# Patient Record
Sex: Male | Born: 1940
Health system: Southern US, Community
[De-identification: ages and names within clinical notes are randomized; demographics above are authoritative.]

## PROBLEM LIST (undated history)

## (undated) DIAGNOSIS — B20 Human immunodeficiency virus [HIV] disease: Secondary | ICD-10-CM

## (undated) DIAGNOSIS — E785 Hyperlipidemia, unspecified: Secondary | ICD-10-CM

## (undated) DIAGNOSIS — F209 Schizophrenia, unspecified: Secondary | ICD-10-CM

## (undated) DIAGNOSIS — F329 Major depressive disorder, single episode, unspecified: Secondary | ICD-10-CM

## (undated) DIAGNOSIS — F419 Anxiety disorder, unspecified: Secondary | ICD-10-CM

## (undated) DIAGNOSIS — F32A Depression, unspecified: Secondary | ICD-10-CM

## (undated) DIAGNOSIS — C801 Malignant (primary) neoplasm, unspecified: Secondary | ICD-10-CM

## (undated) DIAGNOSIS — Z21 Asymptomatic human immunodeficiency virus [HIV] infection status: Secondary | ICD-10-CM

## (undated) DIAGNOSIS — E119 Type 2 diabetes mellitus without complications: Secondary | ICD-10-CM

## (undated) DIAGNOSIS — I10 Essential (primary) hypertension: Secondary | ICD-10-CM

## (undated) DIAGNOSIS — M1990 Unspecified osteoarthritis, unspecified site: Secondary | ICD-10-CM

## (undated) DIAGNOSIS — H269 Unspecified cataract: Secondary | ICD-10-CM

## (undated) DIAGNOSIS — J189 Pneumonia, unspecified organism: Secondary | ICD-10-CM

## (undated) DIAGNOSIS — F1910 Other psychoactive substance abuse, uncomplicated: Secondary | ICD-10-CM

## (undated) DIAGNOSIS — N189 Chronic kidney disease, unspecified: Secondary | ICD-10-CM

## (undated) DIAGNOSIS — D649 Anemia, unspecified: Secondary | ICD-10-CM

## (undated) HISTORY — PX: JOINT REPLACEMENT: SHX530

## (undated) HISTORY — DX: Human immunodeficiency virus (HIV) disease: B20

## (undated) HISTORY — DX: Asymptomatic human immunodeficiency virus (hiv) infection status: Z21

## (undated) HISTORY — DX: Hyperlipidemia, unspecified: E78.5

## (undated) HISTORY — DX: Malignant (primary) neoplasm, unspecified: C80.1

## (undated) HISTORY — DX: Depression, unspecified: F32.A

## (undated) HISTORY — DX: Other psychoactive substance abuse, uncomplicated: F19.10

## (undated) HISTORY — PX: EYE SURGERY: SHX253

## (undated) HISTORY — PX: FRACTURE SURGERY: SHX138

## (undated) HISTORY — DX: Major depressive disorder, single episode, unspecified: F32.9

## (undated) HISTORY — DX: Type 2 diabetes mellitus without complications: E11.9

## (undated) HISTORY — DX: Unspecified osteoarthritis, unspecified site: M19.90

## (undated) HISTORY — DX: Unspecified cataract: H26.9

## (undated) HISTORY — DX: Anxiety disorder, unspecified: F41.9

## (undated) HISTORY — DX: Essential (primary) hypertension: I10

## (undated) HISTORY — DX: Chronic kidney disease, unspecified: N18.9

## (undated) HISTORY — PX: COLONOSCOPY: SHX174

---
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
## 2010-07-09 ENCOUNTER — Ambulatory Visit: Payer: Self-pay | Admitting: Infectious Disease

## 2010-12-03 DIAGNOSIS — B20 Human immunodeficiency virus [HIV] disease: Secondary | ICD-10-CM

## 2011-04-08 DIAGNOSIS — B20 Human immunodeficiency virus [HIV] disease: Secondary | ICD-10-CM

## 2011-07-21 ENCOUNTER — Other Ambulatory Visit: Payer: Self-pay | Admitting: Infectious Disease

## 2011-07-23 ENCOUNTER — Other Ambulatory Visit: Payer: Self-pay | Admitting: Infectious Disease

## 2011-08-08 ENCOUNTER — Other Ambulatory Visit: Payer: Self-pay | Admitting: Infectious Disease

## 2011-08-26 DIAGNOSIS — B20 Human immunodeficiency virus [HIV] disease: Secondary | ICD-10-CM

## 2011-09-09 ENCOUNTER — Other Ambulatory Visit: Payer: Self-pay | Admitting: Infectious Disease

## 2011-10-20 ENCOUNTER — Other Ambulatory Visit: Payer: Self-pay | Admitting: Infectious Disease

## 2011-10-20 ENCOUNTER — Telehealth: Payer: Self-pay | Admitting: *Deleted

## 2011-10-20 NOTE — Telephone Encounter (Signed)
Med escribed in error.  [place] patient, pharmacy notified to disregard. Myrtis Hopping CMA

## 2011-10-24 ENCOUNTER — Other Ambulatory Visit: Payer: Self-pay | Admitting: Infectious Disease

## 2011-12-21 DIAGNOSIS — B20 Human immunodeficiency virus [HIV] disease: Secondary | ICD-10-CM

## 2012-04-12 ENCOUNTER — Encounter: Payer: Self-pay | Admitting: Infectious Disease

## 2012-04-18 DIAGNOSIS — B20 Human immunodeficiency virus [HIV] disease: Secondary | ICD-10-CM

## 2012-08-15 DIAGNOSIS — B20 Human immunodeficiency virus [HIV] disease: Secondary | ICD-10-CM

## 2012-10-02 ENCOUNTER — Other Ambulatory Visit: Payer: Self-pay | Admitting: Infectious Disease

## 2012-12-18 ENCOUNTER — Other Ambulatory Visit: Payer: Self-pay | Admitting: Infectious Disease

## 2013-02-06 DIAGNOSIS — B20 Human immunodeficiency virus [HIV] disease: Secondary | ICD-10-CM

## 2013-03-21 ENCOUNTER — Other Ambulatory Visit: Payer: Self-pay | Admitting: *Deleted

## 2013-03-21 ENCOUNTER — Other Ambulatory Visit: Payer: Self-pay | Admitting: Infectious Disease

## 2013-03-21 NOTE — Telephone Encounter (Signed)
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  Called [facility] in [place].  Gave them the corrected information for further/future refills.

## 2013-03-22 DIAGNOSIS — B20 Human immunodeficiency virus [HIV] disease: Secondary | ICD-10-CM

## 2013-04-09 ENCOUNTER — Other Ambulatory Visit: Payer: Self-pay | Admitting: Infectious Disease

## 2013-09-13 DIAGNOSIS — B20 Human immunodeficiency virus [HIV] disease: Secondary | ICD-10-CM | POA: Diagnosis not present

## 2014-03-08 DIAGNOSIS — B20 Human immunodeficiency virus [HIV] disease: Secondary | ICD-10-CM

## 2014-09-12 DIAGNOSIS — B20 Human immunodeficiency virus [HIV] disease: Secondary | ICD-10-CM

## 2014-09-24 ENCOUNTER — Encounter: Payer: Self-pay | Admitting: Infectious Diseases

## 2014-10-01 DIAGNOSIS — N401 Enlarged prostate with lower urinary tract symptoms: Secondary | ICD-10-CM | POA: Diagnosis not present

## 2014-10-01 DIAGNOSIS — R972 Elevated prostate specific antigen [PSA]: Secondary | ICD-10-CM | POA: Diagnosis not present

## 2014-10-01 DIAGNOSIS — C61 Malignant neoplasm of prostate: Secondary | ICD-10-CM | POA: Diagnosis not present

## 2014-10-08 DIAGNOSIS — I10 Essential (primary) hypertension: Secondary | ICD-10-CM | POA: Diagnosis not present

## 2014-10-08 DIAGNOSIS — F200 Paranoid schizophrenia: Secondary | ICD-10-CM | POA: Diagnosis not present

## 2014-10-08 DIAGNOSIS — F329 Major depressive disorder, single episode, unspecified: Secondary | ICD-10-CM | POA: Diagnosis not present

## 2014-10-08 DIAGNOSIS — K5900 Constipation, unspecified: Secondary | ICD-10-CM | POA: Diagnosis not present

## 2014-10-08 DIAGNOSIS — C61 Malignant neoplasm of prostate: Secondary | ICD-10-CM | POA: Diagnosis not present

## 2014-10-08 DIAGNOSIS — E780 Pure hypercholesterolemia: Secondary | ICD-10-CM | POA: Diagnosis not present

## 2014-10-08 DIAGNOSIS — N400 Enlarged prostate without lower urinary tract symptoms: Secondary | ICD-10-CM | POA: Diagnosis not present

## 2014-10-08 DIAGNOSIS — Z21 Asymptomatic human immunodeficiency virus [HIV] infection status: Secondary | ICD-10-CM | POA: Diagnosis not present

## 2014-10-08 DIAGNOSIS — E109 Type 1 diabetes mellitus without complications: Secondary | ICD-10-CM | POA: Diagnosis not present

## 2014-10-25 DIAGNOSIS — E785 Hyperlipidemia, unspecified: Secondary | ICD-10-CM | POA: Diagnosis not present

## 2014-10-25 DIAGNOSIS — B20 Human immunodeficiency virus [HIV] disease: Secondary | ICD-10-CM | POA: Diagnosis not present

## 2014-10-28 DIAGNOSIS — C61 Malignant neoplasm of prostate: Secondary | ICD-10-CM | POA: Diagnosis not present

## 2014-10-29 DIAGNOSIS — N3281 Overactive bladder: Secondary | ICD-10-CM | POA: Diagnosis not present

## 2014-10-29 DIAGNOSIS — C61 Malignant neoplasm of prostate: Secondary | ICD-10-CM | POA: Diagnosis not present

## 2014-10-30 DIAGNOSIS — Z510 Encounter for antineoplastic radiation therapy: Secondary | ICD-10-CM | POA: Diagnosis not present

## 2014-10-30 DIAGNOSIS — C61 Malignant neoplasm of prostate: Secondary | ICD-10-CM | POA: Diagnosis not present

## 2014-11-05 DIAGNOSIS — C61 Malignant neoplasm of prostate: Secondary | ICD-10-CM | POA: Diagnosis not present

## 2014-11-05 DIAGNOSIS — Z510 Encounter for antineoplastic radiation therapy: Secondary | ICD-10-CM | POA: Diagnosis not present

## 2014-11-05 DIAGNOSIS — Z21 Asymptomatic human immunodeficiency virus [HIV] infection status: Secondary | ICD-10-CM | POA: Diagnosis not present

## 2014-11-05 DIAGNOSIS — F329 Major depressive disorder, single episode, unspecified: Secondary | ICD-10-CM | POA: Diagnosis not present

## 2014-11-05 DIAGNOSIS — E109 Type 1 diabetes mellitus without complications: Secondary | ICD-10-CM | POA: Diagnosis not present

## 2014-11-05 DIAGNOSIS — N400 Enlarged prostate without lower urinary tract symptoms: Secondary | ICD-10-CM | POA: Diagnosis not present

## 2014-11-05 DIAGNOSIS — R945 Abnormal results of liver function studies: Secondary | ICD-10-CM | POA: Diagnosis not present

## 2014-11-05 DIAGNOSIS — K5900 Constipation, unspecified: Secondary | ICD-10-CM | POA: Diagnosis not present

## 2014-11-05 DIAGNOSIS — F200 Paranoid schizophrenia: Secondary | ICD-10-CM | POA: Diagnosis not present

## 2014-11-05 DIAGNOSIS — E780 Pure hypercholesterolemia: Secondary | ICD-10-CM | POA: Diagnosis not present

## 2014-11-06 DIAGNOSIS — Z510 Encounter for antineoplastic radiation therapy: Secondary | ICD-10-CM | POA: Diagnosis not present

## 2014-11-06 DIAGNOSIS — C61 Malignant neoplasm of prostate: Secondary | ICD-10-CM | POA: Diagnosis not present

## 2014-11-13 DIAGNOSIS — C61 Malignant neoplasm of prostate: Secondary | ICD-10-CM | POA: Diagnosis not present

## 2014-11-13 DIAGNOSIS — Z510 Encounter for antineoplastic radiation therapy: Secondary | ICD-10-CM | POA: Diagnosis not present

## 2014-11-14 DIAGNOSIS — C61 Malignant neoplasm of prostate: Secondary | ICD-10-CM | POA: Diagnosis not present

## 2014-11-14 DIAGNOSIS — Z510 Encounter for antineoplastic radiation therapy: Secondary | ICD-10-CM | POA: Diagnosis not present

## 2014-11-15 DIAGNOSIS — C61 Malignant neoplasm of prostate: Secondary | ICD-10-CM | POA: Diagnosis not present

## 2014-11-15 DIAGNOSIS — Z510 Encounter for antineoplastic radiation therapy: Secondary | ICD-10-CM | POA: Diagnosis not present

## 2014-11-18 DIAGNOSIS — Z510 Encounter for antineoplastic radiation therapy: Secondary | ICD-10-CM | POA: Diagnosis not present

## 2014-11-18 DIAGNOSIS — C61 Malignant neoplasm of prostate: Secondary | ICD-10-CM | POA: Diagnosis not present

## 2014-11-19 DIAGNOSIS — C61 Malignant neoplasm of prostate: Secondary | ICD-10-CM | POA: Diagnosis not present

## 2014-11-19 DIAGNOSIS — Z510 Encounter for antineoplastic radiation therapy: Secondary | ICD-10-CM | POA: Diagnosis not present

## 2014-11-20 DIAGNOSIS — Z510 Encounter for antineoplastic radiation therapy: Secondary | ICD-10-CM | POA: Diagnosis not present

## 2014-11-20 DIAGNOSIS — C61 Malignant neoplasm of prostate: Secondary | ICD-10-CM | POA: Diagnosis not present

## 2014-11-21 DIAGNOSIS — C61 Malignant neoplasm of prostate: Secondary | ICD-10-CM | POA: Diagnosis not present

## 2014-11-21 DIAGNOSIS — Z510 Encounter for antineoplastic radiation therapy: Secondary | ICD-10-CM | POA: Diagnosis not present

## 2014-11-22 DIAGNOSIS — Z510 Encounter for antineoplastic radiation therapy: Secondary | ICD-10-CM | POA: Diagnosis not present

## 2014-11-22 DIAGNOSIS — C61 Malignant neoplasm of prostate: Secondary | ICD-10-CM | POA: Diagnosis not present

## 2014-11-25 DIAGNOSIS — Z0100 Encounter for examination of eyes and vision without abnormal findings: Secondary | ICD-10-CM | POA: Diagnosis not present

## 2014-11-25 DIAGNOSIS — Z510 Encounter for antineoplastic radiation therapy: Secondary | ICD-10-CM | POA: Diagnosis not present

## 2014-11-25 DIAGNOSIS — C61 Malignant neoplasm of prostate: Secondary | ICD-10-CM | POA: Diagnosis not present

## 2014-11-26 DIAGNOSIS — C61 Malignant neoplasm of prostate: Secondary | ICD-10-CM | POA: Diagnosis not present

## 2014-11-26 DIAGNOSIS — Z510 Encounter for antineoplastic radiation therapy: Secondary | ICD-10-CM | POA: Diagnosis not present

## 2014-11-27 DIAGNOSIS — C61 Malignant neoplasm of prostate: Secondary | ICD-10-CM | POA: Diagnosis not present

## 2014-11-27 DIAGNOSIS — N3281 Overactive bladder: Secondary | ICD-10-CM | POA: Diagnosis not present

## 2014-11-27 DIAGNOSIS — G4700 Insomnia, unspecified: Secondary | ICD-10-CM | POA: Diagnosis not present

## 2014-11-27 DIAGNOSIS — Z510 Encounter for antineoplastic radiation therapy: Secondary | ICD-10-CM | POA: Diagnosis not present

## 2014-11-28 DIAGNOSIS — C61 Malignant neoplasm of prostate: Secondary | ICD-10-CM | POA: Diagnosis not present

## 2014-11-28 DIAGNOSIS — Z510 Encounter for antineoplastic radiation therapy: Secondary | ICD-10-CM | POA: Diagnosis not present

## 2014-11-29 DIAGNOSIS — Z510 Encounter for antineoplastic radiation therapy: Secondary | ICD-10-CM | POA: Diagnosis not present

## 2014-11-29 DIAGNOSIS — C61 Malignant neoplasm of prostate: Secondary | ICD-10-CM | POA: Diagnosis not present

## 2014-12-02 DIAGNOSIS — Z510 Encounter for antineoplastic radiation therapy: Secondary | ICD-10-CM | POA: Diagnosis not present

## 2014-12-02 DIAGNOSIS — C61 Malignant neoplasm of prostate: Secondary | ICD-10-CM | POA: Diagnosis not present

## 2014-12-03 DIAGNOSIS — C61 Malignant neoplasm of prostate: Secondary | ICD-10-CM | POA: Diagnosis not present

## 2014-12-03 DIAGNOSIS — Z510 Encounter for antineoplastic radiation therapy: Secondary | ICD-10-CM | POA: Diagnosis not present

## 2014-12-04 DIAGNOSIS — C61 Malignant neoplasm of prostate: Secondary | ICD-10-CM | POA: Diagnosis not present

## 2014-12-04 DIAGNOSIS — Z510 Encounter for antineoplastic radiation therapy: Secondary | ICD-10-CM | POA: Diagnosis not present

## 2014-12-05 DIAGNOSIS — C61 Malignant neoplasm of prostate: Secondary | ICD-10-CM | POA: Diagnosis not present

## 2014-12-05 DIAGNOSIS — Z510 Encounter for antineoplastic radiation therapy: Secondary | ICD-10-CM | POA: Diagnosis not present

## 2014-12-06 DIAGNOSIS — C61 Malignant neoplasm of prostate: Secondary | ICD-10-CM | POA: Diagnosis not present

## 2014-12-06 DIAGNOSIS — Z510 Encounter for antineoplastic radiation therapy: Secondary | ICD-10-CM | POA: Diagnosis not present

## 2014-12-08 DIAGNOSIS — R918 Other nonspecific abnormal finding of lung field: Secondary | ICD-10-CM | POA: Diagnosis not present

## 2014-12-08 DIAGNOSIS — R0600 Dyspnea, unspecified: Secondary | ICD-10-CM | POA: Diagnosis not present

## 2014-12-08 DIAGNOSIS — R05 Cough: Secondary | ICD-10-CM | POA: Diagnosis not present

## 2014-12-08 DIAGNOSIS — Z21 Asymptomatic human immunodeficiency virus [HIV] infection status: Secondary | ICD-10-CM | POA: Diagnosis not present

## 2014-12-08 DIAGNOSIS — J189 Pneumonia, unspecified organism: Secondary | ICD-10-CM | POA: Diagnosis not present

## 2014-12-08 DIAGNOSIS — E1165 Type 2 diabetes mellitus with hyperglycemia: Secondary | ICD-10-CM | POA: Diagnosis not present

## 2014-12-08 DIAGNOSIS — R6883 Chills (without fever): Secondary | ICD-10-CM | POA: Diagnosis not present

## 2014-12-08 DIAGNOSIS — D696 Thrombocytopenia, unspecified: Secondary | ICD-10-CM | POA: Diagnosis not present

## 2014-12-08 DIAGNOSIS — J156 Pneumonia due to other aerobic Gram-negative bacteria: Secondary | ICD-10-CM | POA: Diagnosis not present

## 2014-12-08 DIAGNOSIS — A419 Sepsis, unspecified organism: Secondary | ICD-10-CM | POA: Diagnosis not present

## 2014-12-08 DIAGNOSIS — C61 Malignant neoplasm of prostate: Secondary | ICD-10-CM | POA: Diagnosis not present

## 2014-12-08 DIAGNOSIS — N400 Enlarged prostate without lower urinary tract symptoms: Secondary | ICD-10-CM | POA: Diagnosis not present

## 2014-12-08 DIAGNOSIS — Z510 Encounter for antineoplastic radiation therapy: Secondary | ICD-10-CM | POA: Diagnosis not present

## 2014-12-08 DIAGNOSIS — R531 Weakness: Secondary | ICD-10-CM | POA: Diagnosis not present

## 2014-12-08 DIAGNOSIS — I959 Hypotension, unspecified: Secondary | ICD-10-CM | POA: Diagnosis not present

## 2014-12-08 DIAGNOSIS — R6521 Severe sepsis with septic shock: Secondary | ICD-10-CM | POA: Diagnosis not present

## 2014-12-08 DIAGNOSIS — E780 Pure hypercholesterolemia: Secondary | ICD-10-CM | POA: Diagnosis not present

## 2014-12-08 DIAGNOSIS — J984 Other disorders of lung: Secondary | ICD-10-CM | POA: Diagnosis not present

## 2014-12-08 DIAGNOSIS — I10 Essential (primary) hypertension: Secondary | ICD-10-CM | POA: Diagnosis not present

## 2014-12-11 DIAGNOSIS — C61 Malignant neoplasm of prostate: Secondary | ICD-10-CM | POA: Diagnosis not present

## 2014-12-11 DIAGNOSIS — Z510 Encounter for antineoplastic radiation therapy: Secondary | ICD-10-CM | POA: Diagnosis not present

## 2014-12-12 DIAGNOSIS — Z510 Encounter for antineoplastic radiation therapy: Secondary | ICD-10-CM | POA: Diagnosis not present

## 2014-12-12 DIAGNOSIS — C61 Malignant neoplasm of prostate: Secondary | ICD-10-CM | POA: Diagnosis not present

## 2014-12-13 DIAGNOSIS — Z510 Encounter for antineoplastic radiation therapy: Secondary | ICD-10-CM | POA: Diagnosis not present

## 2014-12-13 DIAGNOSIS — C61 Malignant neoplasm of prostate: Secondary | ICD-10-CM | POA: Diagnosis not present

## 2014-12-16 DIAGNOSIS — Z510 Encounter for antineoplastic radiation therapy: Secondary | ICD-10-CM | POA: Diagnosis not present

## 2014-12-16 DIAGNOSIS — C61 Malignant neoplasm of prostate: Secondary | ICD-10-CM | POA: Diagnosis not present

## 2014-12-17 DIAGNOSIS — C61 Malignant neoplasm of prostate: Secondary | ICD-10-CM | POA: Diagnosis not present

## 2014-12-17 DIAGNOSIS — Z510 Encounter for antineoplastic radiation therapy: Secondary | ICD-10-CM | POA: Diagnosis not present

## 2014-12-18 DIAGNOSIS — Z510 Encounter for antineoplastic radiation therapy: Secondary | ICD-10-CM | POA: Diagnosis not present

## 2014-12-18 DIAGNOSIS — C61 Malignant neoplasm of prostate: Secondary | ICD-10-CM | POA: Diagnosis not present

## 2014-12-19 DIAGNOSIS — Z21 Asymptomatic human immunodeficiency virus [HIV] infection status: Secondary | ICD-10-CM | POA: Diagnosis not present

## 2014-12-19 DIAGNOSIS — Z510 Encounter for antineoplastic radiation therapy: Secondary | ICD-10-CM | POA: Diagnosis not present

## 2014-12-19 DIAGNOSIS — E109 Type 1 diabetes mellitus without complications: Secondary | ICD-10-CM | POA: Diagnosis not present

## 2014-12-19 DIAGNOSIS — K5900 Constipation, unspecified: Secondary | ICD-10-CM | POA: Diagnosis not present

## 2014-12-19 DIAGNOSIS — R945 Abnormal results of liver function studies: Secondary | ICD-10-CM | POA: Diagnosis not present

## 2014-12-19 DIAGNOSIS — N400 Enlarged prostate without lower urinary tract symptoms: Secondary | ICD-10-CM | POA: Diagnosis not present

## 2014-12-19 DIAGNOSIS — C61 Malignant neoplasm of prostate: Secondary | ICD-10-CM | POA: Diagnosis not present

## 2014-12-19 DIAGNOSIS — E780 Pure hypercholesterolemia: Secondary | ICD-10-CM | POA: Diagnosis not present

## 2014-12-19 DIAGNOSIS — F200 Paranoid schizophrenia: Secondary | ICD-10-CM | POA: Diagnosis not present

## 2014-12-19 DIAGNOSIS — F329 Major depressive disorder, single episode, unspecified: Secondary | ICD-10-CM | POA: Diagnosis not present

## 2014-12-23 DIAGNOSIS — Z510 Encounter for antineoplastic radiation therapy: Secondary | ICD-10-CM | POA: Diagnosis not present

## 2014-12-23 DIAGNOSIS — C61 Malignant neoplasm of prostate: Secondary | ICD-10-CM | POA: Diagnosis not present

## 2014-12-24 DIAGNOSIS — C61 Malignant neoplasm of prostate: Secondary | ICD-10-CM | POA: Diagnosis not present

## 2014-12-24 DIAGNOSIS — Z510 Encounter for antineoplastic radiation therapy: Secondary | ICD-10-CM | POA: Diagnosis not present

## 2014-12-25 DIAGNOSIS — C61 Malignant neoplasm of prostate: Secondary | ICD-10-CM | POA: Diagnosis not present

## 2014-12-25 DIAGNOSIS — Z510 Encounter for antineoplastic radiation therapy: Secondary | ICD-10-CM | POA: Diagnosis not present

## 2014-12-26 DIAGNOSIS — C61 Malignant neoplasm of prostate: Secondary | ICD-10-CM | POA: Diagnosis not present

## 2014-12-26 DIAGNOSIS — Z510 Encounter for antineoplastic radiation therapy: Secondary | ICD-10-CM | POA: Diagnosis not present

## 2014-12-27 DIAGNOSIS — C61 Malignant neoplasm of prostate: Secondary | ICD-10-CM | POA: Diagnosis not present

## 2014-12-27 DIAGNOSIS — Z510 Encounter for antineoplastic radiation therapy: Secondary | ICD-10-CM | POA: Diagnosis not present

## 2014-12-30 DIAGNOSIS — Z510 Encounter for antineoplastic radiation therapy: Secondary | ICD-10-CM | POA: Diagnosis not present

## 2014-12-30 DIAGNOSIS — C61 Malignant neoplasm of prostate: Secondary | ICD-10-CM | POA: Diagnosis not present

## 2014-12-31 DIAGNOSIS — C61 Malignant neoplasm of prostate: Secondary | ICD-10-CM | POA: Diagnosis not present

## 2014-12-31 DIAGNOSIS — Z510 Encounter for antineoplastic radiation therapy: Secondary | ICD-10-CM | POA: Diagnosis not present

## 2014-12-31 DIAGNOSIS — R945 Abnormal results of liver function studies: Secondary | ICD-10-CM | POA: Diagnosis not present

## 2014-12-31 DIAGNOSIS — E780 Pure hypercholesterolemia: Secondary | ICD-10-CM | POA: Diagnosis not present

## 2014-12-31 DIAGNOSIS — Z21 Asymptomatic human immunodeficiency virus [HIV] infection status: Secondary | ICD-10-CM | POA: Diagnosis not present

## 2014-12-31 DIAGNOSIS — N400 Enlarged prostate without lower urinary tract symptoms: Secondary | ICD-10-CM | POA: Diagnosis not present

## 2014-12-31 DIAGNOSIS — F200 Paranoid schizophrenia: Secondary | ICD-10-CM | POA: Diagnosis not present

## 2014-12-31 DIAGNOSIS — E109 Type 1 diabetes mellitus without complications: Secondary | ICD-10-CM | POA: Diagnosis not present

## 2014-12-31 DIAGNOSIS — K5900 Constipation, unspecified: Secondary | ICD-10-CM | POA: Diagnosis not present

## 2014-12-31 DIAGNOSIS — F329 Major depressive disorder, single episode, unspecified: Secondary | ICD-10-CM | POA: Diagnosis not present

## 2014-12-31 DIAGNOSIS — I10 Essential (primary) hypertension: Secondary | ICD-10-CM | POA: Diagnosis not present

## 2015-01-01 DIAGNOSIS — C61 Malignant neoplasm of prostate: Secondary | ICD-10-CM | POA: Diagnosis not present

## 2015-01-01 DIAGNOSIS — Z510 Encounter for antineoplastic radiation therapy: Secondary | ICD-10-CM | POA: Diagnosis not present

## 2015-01-02 DIAGNOSIS — Z510 Encounter for antineoplastic radiation therapy: Secondary | ICD-10-CM | POA: Diagnosis not present

## 2015-01-02 DIAGNOSIS — C61 Malignant neoplasm of prostate: Secondary | ICD-10-CM | POA: Diagnosis not present

## 2015-01-03 DIAGNOSIS — Z510 Encounter for antineoplastic radiation therapy: Secondary | ICD-10-CM | POA: Diagnosis not present

## 2015-01-03 DIAGNOSIS — C61 Malignant neoplasm of prostate: Secondary | ICD-10-CM | POA: Diagnosis not present

## 2015-01-06 DIAGNOSIS — C61 Malignant neoplasm of prostate: Secondary | ICD-10-CM | POA: Diagnosis not present

## 2015-01-06 DIAGNOSIS — Z510 Encounter for antineoplastic radiation therapy: Secondary | ICD-10-CM | POA: Diagnosis not present

## 2015-01-07 DIAGNOSIS — Z510 Encounter for antineoplastic radiation therapy: Secondary | ICD-10-CM | POA: Diagnosis not present

## 2015-01-07 DIAGNOSIS — C61 Malignant neoplasm of prostate: Secondary | ICD-10-CM | POA: Diagnosis not present

## 2015-01-08 DIAGNOSIS — Z510 Encounter for antineoplastic radiation therapy: Secondary | ICD-10-CM | POA: Diagnosis not present

## 2015-01-08 DIAGNOSIS — C61 Malignant neoplasm of prostate: Secondary | ICD-10-CM | POA: Diagnosis not present

## 2015-01-09 DIAGNOSIS — N3281 Overactive bladder: Secondary | ICD-10-CM | POA: Diagnosis not present

## 2015-01-09 DIAGNOSIS — C61 Malignant neoplasm of prostate: Secondary | ICD-10-CM | POA: Diagnosis not present

## 2015-01-09 DIAGNOSIS — Z510 Encounter for antineoplastic radiation therapy: Secondary | ICD-10-CM | POA: Diagnosis not present

## 2015-01-10 DIAGNOSIS — C61 Malignant neoplasm of prostate: Secondary | ICD-10-CM | POA: Diagnosis not present

## 2015-01-10 DIAGNOSIS — Z510 Encounter for antineoplastic radiation therapy: Secondary | ICD-10-CM | POA: Diagnosis not present

## 2015-01-14 DIAGNOSIS — Z1389 Encounter for screening for other disorder: Secondary | ICD-10-CM | POA: Diagnosis not present

## 2015-01-14 DIAGNOSIS — K219 Gastro-esophageal reflux disease without esophagitis: Secondary | ICD-10-CM | POA: Diagnosis not present

## 2015-01-14 DIAGNOSIS — Z9181 History of falling: Secondary | ICD-10-CM | POA: Diagnosis not present

## 2015-01-14 DIAGNOSIS — E46 Unspecified protein-calorie malnutrition: Secondary | ICD-10-CM | POA: Diagnosis not present

## 2015-01-14 DIAGNOSIS — N400 Enlarged prostate without lower urinary tract symptoms: Secondary | ICD-10-CM | POA: Diagnosis not present

## 2015-01-14 DIAGNOSIS — I714 Abdominal aortic aneurysm, without rupture: Secondary | ICD-10-CM | POA: Diagnosis not present

## 2015-01-14 DIAGNOSIS — M545 Low back pain: Secondary | ICD-10-CM | POA: Diagnosis not present

## 2015-01-14 DIAGNOSIS — F329 Major depressive disorder, single episode, unspecified: Secondary | ICD-10-CM | POA: Diagnosis not present

## 2015-02-05 DIAGNOSIS — Z79899 Other long term (current) drug therapy: Secondary | ICD-10-CM | POA: Diagnosis not present

## 2015-02-05 DIAGNOSIS — G8929 Other chronic pain: Secondary | ICD-10-CM | POA: Diagnosis not present

## 2015-02-05 DIAGNOSIS — E089 Diabetes mellitus due to underlying condition without complications: Secondary | ICD-10-CM | POA: Diagnosis not present

## 2015-02-05 DIAGNOSIS — G893 Neoplasm related pain (acute) (chronic): Secondary | ICD-10-CM | POA: Diagnosis not present

## 2015-02-05 DIAGNOSIS — M179 Osteoarthritis of knee, unspecified: Secondary | ICD-10-CM | POA: Diagnosis not present

## 2015-02-05 DIAGNOSIS — G894 Chronic pain syndrome: Secondary | ICD-10-CM | POA: Diagnosis not present

## 2015-02-05 DIAGNOSIS — I10 Essential (primary) hypertension: Secondary | ICD-10-CM | POA: Diagnosis not present

## 2015-02-05 DIAGNOSIS — M545 Low back pain: Secondary | ICD-10-CM | POA: Diagnosis not present

## 2015-02-06 DIAGNOSIS — Z79899 Other long term (current) drug therapy: Secondary | ICD-10-CM | POA: Diagnosis not present

## 2015-02-06 DIAGNOSIS — B20 Human immunodeficiency virus [HIV] disease: Secondary | ICD-10-CM | POA: Diagnosis not present

## 2015-02-12 DIAGNOSIS — E109 Type 1 diabetes mellitus without complications: Secondary | ICD-10-CM | POA: Diagnosis not present

## 2015-02-12 DIAGNOSIS — K5900 Constipation, unspecified: Secondary | ICD-10-CM | POA: Diagnosis not present

## 2015-02-12 DIAGNOSIS — F329 Major depressive disorder, single episode, unspecified: Secondary | ICD-10-CM | POA: Diagnosis not present

## 2015-02-12 DIAGNOSIS — F200 Paranoid schizophrenia: Secondary | ICD-10-CM | POA: Diagnosis not present

## 2015-02-12 DIAGNOSIS — N400 Enlarged prostate without lower urinary tract symptoms: Secondary | ICD-10-CM | POA: Diagnosis not present

## 2015-02-12 DIAGNOSIS — E780 Pure hypercholesterolemia: Secondary | ICD-10-CM | POA: Diagnosis not present

## 2015-02-12 DIAGNOSIS — Z21 Asymptomatic human immunodeficiency virus [HIV] infection status: Secondary | ICD-10-CM | POA: Diagnosis not present

## 2015-02-12 DIAGNOSIS — Z79899 Other long term (current) drug therapy: Secondary | ICD-10-CM | POA: Diagnosis not present

## 2015-02-20 DIAGNOSIS — C61 Malignant neoplasm of prostate: Secondary | ICD-10-CM | POA: Diagnosis not present

## 2015-02-20 DIAGNOSIS — N3281 Overactive bladder: Secondary | ICD-10-CM | POA: Diagnosis not present

## 2015-02-27 DIAGNOSIS — E119 Type 2 diabetes mellitus without complications: Secondary | ICD-10-CM | POA: Diagnosis not present

## 2015-02-27 DIAGNOSIS — C61 Malignant neoplasm of prostate: Secondary | ICD-10-CM | POA: Diagnosis not present

## 2015-02-27 DIAGNOSIS — G4700 Insomnia, unspecified: Secondary | ICD-10-CM | POA: Diagnosis not present

## 2015-02-27 DIAGNOSIS — B20 Human immunodeficiency virus [HIV] disease: Secondary | ICD-10-CM | POA: Diagnosis not present

## 2015-02-28 DIAGNOSIS — B20 Human immunodeficiency virus [HIV] disease: Secondary | ICD-10-CM | POA: Diagnosis not present

## 2015-03-06 DIAGNOSIS — E780 Pure hypercholesterolemia: Secondary | ICD-10-CM | POA: Diagnosis not present

## 2015-03-06 DIAGNOSIS — F329 Major depressive disorder, single episode, unspecified: Secondary | ICD-10-CM | POA: Diagnosis not present

## 2015-03-06 DIAGNOSIS — Z21 Asymptomatic human immunodeficiency virus [HIV] infection status: Secondary | ICD-10-CM | POA: Diagnosis not present

## 2015-03-06 DIAGNOSIS — E109 Type 1 diabetes mellitus without complications: Secondary | ICD-10-CM | POA: Diagnosis not present

## 2015-03-06 DIAGNOSIS — N400 Enlarged prostate without lower urinary tract symptoms: Secondary | ICD-10-CM | POA: Diagnosis not present

## 2015-03-06 DIAGNOSIS — Z9181 History of falling: Secondary | ICD-10-CM | POA: Diagnosis not present

## 2015-03-06 DIAGNOSIS — K5900 Constipation, unspecified: Secondary | ICD-10-CM | POA: Diagnosis not present

## 2015-03-06 DIAGNOSIS — F200 Paranoid schizophrenia: Secondary | ICD-10-CM | POA: Diagnosis not present

## 2015-04-03 DIAGNOSIS — E109 Type 1 diabetes mellitus without complications: Secondary | ICD-10-CM | POA: Diagnosis not present

## 2015-04-03 DIAGNOSIS — I10 Essential (primary) hypertension: Secondary | ICD-10-CM | POA: Diagnosis not present

## 2015-04-03 DIAGNOSIS — F200 Paranoid schizophrenia: Secondary | ICD-10-CM | POA: Diagnosis not present

## 2015-04-03 DIAGNOSIS — Z21 Asymptomatic human immunodeficiency virus [HIV] infection status: Secondary | ICD-10-CM | POA: Diagnosis not present

## 2015-04-03 DIAGNOSIS — E780 Pure hypercholesterolemia: Secondary | ICD-10-CM | POA: Diagnosis not present

## 2015-04-03 DIAGNOSIS — F329 Major depressive disorder, single episode, unspecified: Secondary | ICD-10-CM | POA: Diagnosis not present

## 2015-04-03 DIAGNOSIS — E669 Obesity, unspecified: Secondary | ICD-10-CM | POA: Diagnosis not present

## 2015-04-03 DIAGNOSIS — K5900 Constipation, unspecified: Secondary | ICD-10-CM | POA: Diagnosis not present

## 2015-04-03 DIAGNOSIS — N400 Enlarged prostate without lower urinary tract symptoms: Secondary | ICD-10-CM | POA: Diagnosis not present

## 2015-05-01 DIAGNOSIS — F200 Paranoid schizophrenia: Secondary | ICD-10-CM | POA: Diagnosis not present

## 2015-05-01 DIAGNOSIS — K5900 Constipation, unspecified: Secondary | ICD-10-CM | POA: Diagnosis not present

## 2015-05-01 DIAGNOSIS — F329 Major depressive disorder, single episode, unspecified: Secondary | ICD-10-CM | POA: Diagnosis not present

## 2015-05-01 DIAGNOSIS — E109 Type 1 diabetes mellitus without complications: Secondary | ICD-10-CM | POA: Diagnosis not present

## 2015-05-01 DIAGNOSIS — Z21 Asymptomatic human immunodeficiency virus [HIV] infection status: Secondary | ICD-10-CM | POA: Diagnosis not present

## 2015-05-01 DIAGNOSIS — R945 Abnormal results of liver function studies: Secondary | ICD-10-CM | POA: Diagnosis not present

## 2015-05-01 DIAGNOSIS — E780 Pure hypercholesterolemia: Secondary | ICD-10-CM | POA: Diagnosis not present

## 2015-05-01 DIAGNOSIS — N400 Enlarged prostate without lower urinary tract symptoms: Secondary | ICD-10-CM | POA: Diagnosis not present

## 2015-05-29 DIAGNOSIS — Z21 Asymptomatic human immunodeficiency virus [HIV] infection status: Secondary | ICD-10-CM | POA: Diagnosis not present

## 2015-05-29 DIAGNOSIS — F200 Paranoid schizophrenia: Secondary | ICD-10-CM | POA: Diagnosis not present

## 2015-05-29 DIAGNOSIS — F329 Major depressive disorder, single episode, unspecified: Secondary | ICD-10-CM | POA: Diagnosis not present

## 2015-05-29 DIAGNOSIS — R945 Abnormal results of liver function studies: Secondary | ICD-10-CM | POA: Diagnosis not present

## 2015-05-29 DIAGNOSIS — K5900 Constipation, unspecified: Secondary | ICD-10-CM | POA: Diagnosis not present

## 2015-05-29 DIAGNOSIS — E780 Pure hypercholesterolemia: Secondary | ICD-10-CM | POA: Diagnosis not present

## 2015-05-29 DIAGNOSIS — N400 Enlarged prostate without lower urinary tract symptoms: Secondary | ICD-10-CM | POA: Diagnosis not present

## 2015-05-29 DIAGNOSIS — E109 Type 1 diabetes mellitus without complications: Secondary | ICD-10-CM | POA: Diagnosis not present

## 2015-06-03 DIAGNOSIS — Z1211 Encounter for screening for malignant neoplasm of colon: Secondary | ICD-10-CM | POA: Diagnosis not present

## 2015-06-03 DIAGNOSIS — R1032 Left lower quadrant pain: Secondary | ICD-10-CM | POA: Diagnosis not present

## 2015-06-03 DIAGNOSIS — Z86010 Personal history of colonic polyps: Secondary | ICD-10-CM | POA: Diagnosis not present

## 2015-06-10 DIAGNOSIS — N3281 Overactive bladder: Secondary | ICD-10-CM | POA: Diagnosis not present

## 2015-06-10 DIAGNOSIS — C61 Malignant neoplasm of prostate: Secondary | ICD-10-CM | POA: Diagnosis not present

## 2015-06-19 DIAGNOSIS — E11329 Type 2 diabetes mellitus with mild nonproliferative diabetic retinopathy without macular edema: Secondary | ICD-10-CM | POA: Diagnosis not present

## 2015-06-26 DIAGNOSIS — K5900 Constipation, unspecified: Secondary | ICD-10-CM | POA: Diagnosis not present

## 2015-06-26 DIAGNOSIS — N400 Enlarged prostate without lower urinary tract symptoms: Secondary | ICD-10-CM | POA: Diagnosis not present

## 2015-06-26 DIAGNOSIS — E109 Type 1 diabetes mellitus without complications: Secondary | ICD-10-CM | POA: Diagnosis not present

## 2015-06-26 DIAGNOSIS — F200 Paranoid schizophrenia: Secondary | ICD-10-CM | POA: Diagnosis not present

## 2015-06-26 DIAGNOSIS — F329 Major depressive disorder, single episode, unspecified: Secondary | ICD-10-CM | POA: Diagnosis not present

## 2015-06-26 DIAGNOSIS — E780 Pure hypercholesterolemia: Secondary | ICD-10-CM | POA: Diagnosis not present

## 2015-06-26 DIAGNOSIS — Z21 Asymptomatic human immunodeficiency virus [HIV] infection status: Secondary | ICD-10-CM | POA: Diagnosis not present

## 2015-06-26 DIAGNOSIS — Z23 Encounter for immunization: Secondary | ICD-10-CM | POA: Diagnosis not present

## 2015-07-10 DIAGNOSIS — J329 Chronic sinusitis, unspecified: Secondary | ICD-10-CM | POA: Diagnosis not present

## 2015-07-10 DIAGNOSIS — C61 Malignant neoplasm of prostate: Secondary | ICD-10-CM | POA: Diagnosis not present

## 2015-07-10 DIAGNOSIS — E119 Type 2 diabetes mellitus without complications: Secondary | ICD-10-CM | POA: Diagnosis not present

## 2015-07-10 DIAGNOSIS — B20 Human immunodeficiency virus [HIV] disease: Secondary | ICD-10-CM | POA: Diagnosis not present

## 2015-07-14 DIAGNOSIS — B20 Human immunodeficiency virus [HIV] disease: Secondary | ICD-10-CM | POA: Diagnosis not present

## 2015-07-24 DIAGNOSIS — F329 Major depressive disorder, single episode, unspecified: Secondary | ICD-10-CM | POA: Diagnosis not present

## 2015-07-24 DIAGNOSIS — E1159 Type 2 diabetes mellitus with other circulatory complications: Secondary | ICD-10-CM | POA: Diagnosis not present

## 2015-07-24 DIAGNOSIS — F200 Paranoid schizophrenia: Secondary | ICD-10-CM | POA: Diagnosis not present

## 2015-07-24 DIAGNOSIS — E7800 Pure hypercholesterolemia, unspecified: Secondary | ICD-10-CM | POA: Diagnosis not present

## 2015-07-24 DIAGNOSIS — N401 Enlarged prostate with lower urinary tract symptoms: Secondary | ICD-10-CM | POA: Diagnosis not present

## 2015-07-24 DIAGNOSIS — K5909 Other constipation: Secondary | ICD-10-CM | POA: Diagnosis not present

## 2015-07-24 DIAGNOSIS — E109 Type 1 diabetes mellitus without complications: Secondary | ICD-10-CM | POA: Diagnosis not present

## 2015-07-24 DIAGNOSIS — Z21 Asymptomatic human immunodeficiency virus [HIV] infection status: Secondary | ICD-10-CM | POA: Diagnosis not present

## 2015-09-09 DIAGNOSIS — N3281 Overactive bladder: Secondary | ICD-10-CM | POA: Diagnosis not present

## 2015-09-09 DIAGNOSIS — C61 Malignant neoplasm of prostate: Secondary | ICD-10-CM | POA: Diagnosis not present

## 2015-09-23 DIAGNOSIS — F200 Paranoid schizophrenia: Secondary | ICD-10-CM | POA: Diagnosis not present

## 2015-09-23 DIAGNOSIS — F329 Major depressive disorder, single episode, unspecified: Secondary | ICD-10-CM | POA: Diagnosis not present

## 2015-09-23 DIAGNOSIS — E1159 Type 2 diabetes mellitus with other circulatory complications: Secondary | ICD-10-CM | POA: Diagnosis not present

## 2015-09-23 DIAGNOSIS — M5416 Radiculopathy, lumbar region: Secondary | ICD-10-CM | POA: Diagnosis not present

## 2015-09-23 DIAGNOSIS — E7800 Pure hypercholesterolemia, unspecified: Secondary | ICD-10-CM | POA: Diagnosis not present

## 2015-09-23 DIAGNOSIS — E109 Type 1 diabetes mellitus without complications: Secondary | ICD-10-CM | POA: Diagnosis not present

## 2015-09-23 DIAGNOSIS — K5909 Other constipation: Secondary | ICD-10-CM | POA: Diagnosis not present

## 2015-09-23 DIAGNOSIS — N401 Enlarged prostate with lower urinary tract symptoms: Secondary | ICD-10-CM | POA: Diagnosis not present

## 2015-11-26 DIAGNOSIS — F19980 Other psychoactive substance use, unspecified with psychoactive substance-induced anxiety disorder: Secondary | ICD-10-CM | POA: Diagnosis not present

## 2015-11-26 DIAGNOSIS — F200 Paranoid schizophrenia: Secondary | ICD-10-CM | POA: Diagnosis not present

## 2015-11-26 DIAGNOSIS — K5909 Other constipation: Secondary | ICD-10-CM | POA: Diagnosis not present

## 2015-11-26 DIAGNOSIS — E7800 Pure hypercholesterolemia, unspecified: Secondary | ICD-10-CM | POA: Diagnosis not present

## 2015-11-26 DIAGNOSIS — Z21 Asymptomatic human immunodeficiency virus [HIV] infection status: Secondary | ICD-10-CM | POA: Diagnosis not present

## 2015-11-26 DIAGNOSIS — E1159 Type 2 diabetes mellitus with other circulatory complications: Secondary | ICD-10-CM | POA: Diagnosis not present

## 2015-11-26 DIAGNOSIS — R945 Abnormal results of liver function studies: Secondary | ICD-10-CM | POA: Diagnosis not present

## 2015-11-26 DIAGNOSIS — M5416 Radiculopathy, lumbar region: Secondary | ICD-10-CM | POA: Diagnosis not present

## 2015-11-26 DIAGNOSIS — E109 Type 1 diabetes mellitus without complications: Secondary | ICD-10-CM | POA: Diagnosis not present

## 2015-11-26 DIAGNOSIS — F329 Major depressive disorder, single episode, unspecified: Secondary | ICD-10-CM | POA: Diagnosis not present

## 2015-12-02 DIAGNOSIS — Z86010 Personal history of colonic polyps: Secondary | ICD-10-CM | POA: Diagnosis not present

## 2015-12-02 DIAGNOSIS — R1032 Left lower quadrant pain: Secondary | ICD-10-CM | POA: Diagnosis not present

## 2015-12-08 DIAGNOSIS — C61 Malignant neoplasm of prostate: Secondary | ICD-10-CM | POA: Diagnosis not present

## 2015-12-08 DIAGNOSIS — N3281 Overactive bladder: Secondary | ICD-10-CM | POA: Diagnosis not present

## 2015-12-09 ENCOUNTER — Other Ambulatory Visit: Payer: Self-pay

## 2015-12-09 NOTE — Patient Outreach (Signed)
[place] [facility]) Care Management  12/09/2015  Darrell [name] [birth year] [id number]  Telephone call to patient regarding primary MD referral. Unable to reach patient. HIPAA compliant voice message left with call back phone number.  PLAN: RNCM will attempt 2nd telephone call to patient within 1 week.  Quinn Plowman RN,BSN,CCM [facility] Telephonic  [phone]

## 2015-12-11 ENCOUNTER — Ambulatory Visit: Payer: Self-pay

## 2015-12-12 ENCOUNTER — Other Ambulatory Visit: Payer: Self-pay

## 2015-12-12 NOTE — Patient Outreach (Signed)
[place] [facility]) Care Management  12/12/2015  Darrell [name] [birth year] [id number]  Telephone call to patient regarding primary MD referral. Unable to reach patient. HIPAA compliant voice message left with call back phone number.   PLAN: RNCM will attempt 3rd telephone outreach to patient within 1 week.   Quinn Plowman RN,BSN,CCM [facility] Telephonic  [phone]

## 2015-12-15 ENCOUNTER — Other Ambulatory Visit: Payer: Self-pay

## 2015-12-15 DIAGNOSIS — E0821 Diabetes mellitus due to underlying condition with diabetic nephropathy: Secondary | ICD-10-CM

## 2015-12-15 DIAGNOSIS — Z79899 Other long term (current) drug therapy: Secondary | ICD-10-CM

## 2015-12-15 DIAGNOSIS — R296 Repeated falls: Secondary | ICD-10-CM

## 2015-12-15 DIAGNOSIS — Z794 Long term (current) use of insulin: Secondary | ICD-10-CM

## 2015-12-15 NOTE — Addendum Note (Signed)
Addended by: Quinn Plowman E on: 12/15/2015 03:06 PM   Modules accepted: Orders

## 2015-12-15 NOTE — Patient Outreach (Signed)
[place] [facility]) Care Management  12/15/2015  Darrell [name] 09-20-1941 [id number]   SUBJECTIVE: RNCM contacted [place] with Dr. Marguerita Beards office to obtain an alternate contact number to reach patient.  Alternate contact phone number given.  RNCM contacted patient regarding primary MD referral. HIPAA verified with patient. Discussed and offered THn care management services to patient. Patient verbalized agreement to receive services.  Patient states he has had diabetes for approximately 10-15 years. Patient confirms he takes insulin and checks his blood sugars at least 2 times per day.  Patient states he is unsure of what "normal blood sugars would be."  Patient states his doctor tells him his blood sugars are a little high. Patient states his blood sugar range has been between 90 and 134 fasting. Patient states he thought this blood sugar range was ok. Patient unsure what his A1c is and is unsure of what his A1c goal is. Patient states he has not had any formal diabetic education.  Patient states he has problems with his balance. Patient states she does not use an ambulatory device.  Patient states he has fallen at least 3 times since the beginning of 2017 without injury. Patient states he has fallen approximately 20 times in 2016 without injury.  Patient states he has mentioned this to his primary MD but nothing has been done. Patient states he has never been referred for any testing or to a specialist due to the balance / fall issues. Patient denies being dizzy when he falls but states he occasionally is light headed. Patient states he can be walking and will lose his balance and fall.  Patient states he is on approximately 19 to 20 medications. Patient states he is able to afford all of his medications. Patient states he does have a few questions relates to his medications.   ASSESSMENT: Medical record sent from primary MD office on patient from October 2016.  Patients lab blood sugar level  In  October 2016 was 207.  A1c 6.2.  Patient would benefit from home safety evaluation due to multiple falls at home.   PLAN:  RNCM will refer patient to community case manager and pharmacy.   Quinn Plowman RN,BSN,CCM [facility] Telephonic  [phone]

## 2015-12-16 ENCOUNTER — Ambulatory Visit: Payer: Self-pay

## 2015-12-17 ENCOUNTER — Other Ambulatory Visit: Payer: Self-pay

## 2015-12-17 NOTE — Patient Outreach (Signed)
New referral for community case management: Placed call to patient at recommended number. Patient not at home. Left a message with my name and phone number and requested a call back from patient.  Plan: Will await a call back. If no call back will attempt second outreach.  Tomasa Rand, RN, BSN, CEN [facility] [employer] [phone]

## 2015-12-24 DIAGNOSIS — Z79899 Other long term (current) drug therapy: Secondary | ICD-10-CM | POA: Diagnosis not present

## 2015-12-24 DIAGNOSIS — M5416 Radiculopathy, lumbar region: Secondary | ICD-10-CM | POA: Diagnosis not present

## 2015-12-24 DIAGNOSIS — F19982 Other psychoactive substance use, unspecified with psychoactive substance-induced sleep disorder: Secondary | ICD-10-CM | POA: Diagnosis not present

## 2015-12-24 DIAGNOSIS — F200 Paranoid schizophrenia: Secondary | ICD-10-CM | POA: Diagnosis not present

## 2015-12-24 DIAGNOSIS — F329 Major depressive disorder, single episode, unspecified: Secondary | ICD-10-CM | POA: Diagnosis not present

## 2015-12-24 DIAGNOSIS — K5909 Other constipation: Secondary | ICD-10-CM | POA: Diagnosis not present

## 2015-12-24 DIAGNOSIS — Z21 Asymptomatic human immunodeficiency virus [HIV] infection status: Secondary | ICD-10-CM | POA: Diagnosis not present

## 2015-12-24 DIAGNOSIS — R945 Abnormal results of liver function studies: Secondary | ICD-10-CM | POA: Diagnosis not present

## 2015-12-24 DIAGNOSIS — E109 Type 1 diabetes mellitus without complications: Secondary | ICD-10-CM | POA: Diagnosis not present

## 2015-12-25 ENCOUNTER — Other Ambulatory Visit: Payer: Self-pay

## 2015-12-25 NOTE — Patient Outreach (Signed)
New referral: Second attempt to reach patient. Patient answered the phone. Explained purpose of call to assist patient with falls.  Offered home visit and patient has accepted. Home visit planned for April 4th at 12:15. Confirmed address which is wrong in epic. Current address is  [address], [place] [address].  Provided my contact information:  PLAN: Scheduled home visit.            Will send in basket message to care management assistant to update address.  Tomasa Rand, RN, BSN, CEN [facility] [employer] [phone]

## 2015-12-30 ENCOUNTER — Other Ambulatory Visit: Payer: Self-pay

## 2015-12-31 NOTE — Patient Outreach (Signed)
[place] [facility]) Care Management   12/31/2015  Darrell [name] [birth year] [id number]  Darrell [name] is an 75 y.o. male 12:15pm  Arrived for home visit. Patients friend Darrell [name] present for home visit. Subjective: Patient reports that he has frequent falls. Reports 6 falls in the last 2 weeks. States that he works outside in the yard. Reports that when he stands back up or changes positions that he losses his balance. Denies being dizzy. States that he knows he is going to fall but can't stop the fall. Reports that he has a follow up plan with ortho to evaluate his knee replacements. Patient declines wanting to use a walker. States that he could use a cane.  Declines home health physical therapy or balance therapy.   Patient reports that his DM is under good control. Reports all other medical problems are not causing him any concern. Denies any recent changes in medications that would be causing him to fall.  Objective: Home neat and clean. Throw rugs noted in the home but appear secured and are non slipping.  Awake and alert. Ambulating well in the home today with no problems with balance today. Observed walking numerous times with staggering or holding onto furniture. Review of Systems  Constitutional: Negative.   HENT: Negative.   Eyes: Negative.   Respiratory: Negative.   Cardiovascular: Negative.   Gastrointestinal: Negative.   Genitourinary: Negative.   Musculoskeletal:       Reports chronic joint pain.  Skin: Negative.   Neurological: Negative for dizziness and focal weakness.  Endo/Heme/Allergies: Negative.   Psychiatric/Behavioral: Negative.     Physical Exam  Constitutional: He is oriented to person, place, and time. He appears well-developed and well-nourished.  Cardiovascular: Normal rate.   Respiratory: Effort normal and breath sounds normal.  Lungs clear  GI: Soft. Bowel sounds are normal.  Musculoskeletal: Normal range of motion. He exhibits no edema.   Neurological: He is alert and oriented to person, place, and time.  Skin: Skin is warm and dry.  Feet without sores or lesions.  Psychiatric: He has a normal mood and affect. His behavior is normal. Judgment and thought content normal.    Encounter Medications:   Outpatient Encounter Prescriptions as of 12/30/2015  Medication Sig  . atorvastatin (LIPITOR) 20 MG tablet TAKE ONE TABLET BY MOUTH EVERY DAY  . buPROPion (WELLBUTRIN SR) 150 MG 12 hr tablet Take 150 mg by mouth daily.  . DULoxetine (CYMBALTA) 60 MG capsule Take 60 mg by mouth daily.  . fesoterodine (TOVIAZ) 8 MG TB24 tablet Take 8 mg by mouth daily.  Marland Kitchen HYDROcodone-acetaminophen (NORCO/VICODIN) 5-325 MG tablet Take 1 tablet by mouth every 6 (six) hours as needed.  . insulin NPH-regular Human (NOVOLIN 70/30) (70-30) 100 UNIT/ML injection Inject 60 Units into the skin 2 (two) times daily.  Marland Kitchen KALETRA 200-50 MG per tablet TAKE TWO TABLETS BY MOUTH TWICE DAILY  . lisinopril (PRINIVIL,ZESTRIL) 20 MG tablet Take 20 mg by mouth daily.  . mirabegron ER (MYRBETRIQ) 25 MG TB24 tablet Take 25 mg by mouth daily.  . MULTIPLE VITAMIN PO Take 1 tablet by mouth daily.  . Omega-3 Fatty Acids (FISH OIL) 1200 MG CAPS Take 2 capsules by mouth 2 (two) times daily.  . risperiDONE (RISPERDAL) 1 MG tablet Take 1 mg by mouth at bedtime.  . tamsulosin (FLOMAX) 0.4 MG CAPS capsule Take 0.4 mg by mouth 2 (two) times daily before a meal.  . Tamsulosin HCl (FLOMAX) 0.4 MG CAPS TAKE ONE CAPSULE BY MOUTH  EVERY DAY  . traMADol (ULTRAM) 50 MG tablet Take 1 tablet by mouth every 6 (six) hours as needed.  . NORVIR 100 MG capsule TAKE ONE CAPSULE BY MOUTH EVERY DAY (Patient not taking: Reported on 12/30/2015)  . TRUVADA 200-300 MG per tablet TAKE ONE TABLET BY MOUTH EVERY DAY (Patient not taking: Reported on 12/30/2015)   No facility-administered encounter medications on file as of 12/30/2015.    Functional Status:   In your present state of health, do you have any  difficulty performing the following activities: 12/30/2015  Hearing? Y  Vision? Y  Difficulty concentrating or making decisions? Y  Walking or climbing stairs? Y  Dressing or bathing? N  Doing errands, shopping? N  Preparing Food and eating ? N  Using the Toilet? N  In the past six months, have you accidently leaked urine? N  Do you have problems with loss of bowel control? N  Managing your Medications? Y  Managing your Finances? N  Housekeeping or managing your Housekeeping? N    Fall/Depression Screening:    PHQ 2/9 Scores 12/30/2015  PHQ - 2 Score 1   Fall Risk  12/30/2015 12/15/2015  Falls in the past year? Yes Yes  Number falls in past yr: - 2 or more  Injury with Fall? No No  Risk Factor Category  High Fall Risk High Fall Risk  Risk for fall due to : Impaired balance/gait History of fall(s)  Risk for fall due to (comments): not interested in walker -  Follow up Falls prevention discussed (No Data)   Assessment:   (1) Reviewed [facility] program. Provided new patient packet, magnet, contact card and [facility] calendar. All questions answered and consent obtained. (2) increase risk for falls.  (3) DM: patient reports good control with Hgb A1c of 5.9  Plan:  (1)Consent scanned into chart. (2) reviewed fall prevention. Anti slip socks, removing rugs in the home. Discussed option for balance therapy, patient declined. Discussed use of walker and patient decline. Encouraged use of cane. Reviewed with patient the importance of changing positions slowly.  EMMI education provided. Will ask [facility] pharmacy to review medications for potential side effects causing balance problems. (3) encouraged patient to continue to monitor cbg and take medications as prescribed.   Next outreach planned for May 2nd. Patient informed I would call him for follow up.  Care planning and goal setting during initial home visit. Primary goal is to avoid falls.      THN CM Care Plan Problem One        Most Recent Value    Care Plan Problem One  Alteration in mobility related to frequentf falls.   Role Documenting the Problem One  Care Management [place] for Problem One  Active   THN Long Term Goal (31-90 days)  Patient will report a decrease in falls for the next 31 days.   THN Long Term Goal Start Date  12/30/15   Interventions for Problem One Long Term Goal  reviewed non slip socks, use of cane. Discussed option for balance therapy or physical therapy( patient declined) reviewed importance of change of positions slowly.   THN CM Short Term Goal #1 (0-30 days)  Patient will record falls on [facility] calendar for the next 30 days   THN CM Short Term Goal #1 Start Date  12/30/15   Interventions for Short Term Goal #1  disucssed with patient the importance of keeping up with number of falls . He agreed to  record in [facility] calendar.   THN CM Short Term Goal #2 (0-30 days)  Patient will report using  cane for the next 30 days   THN CM Short Term Goal #2 Start Date  12/30/15   Interventions for Short Term Goal #2  Enocuraged patient to use assistive device to see if this changes the number of falls. encouraged patient to consider removing rugs and wearing non slip socks.     This note and barrier letter sent to MD.  Tomasa Rand, RN, BSN, CEN [place] Coordinator [phone]

## 2016-01-01 DIAGNOSIS — B20 Human immunodeficiency virus [HIV] disease: Secondary | ICD-10-CM | POA: Diagnosis not present

## 2016-01-01 DIAGNOSIS — K219 Gastro-esophageal reflux disease without esophagitis: Secondary | ICD-10-CM | POA: Diagnosis not present

## 2016-01-01 DIAGNOSIS — K649 Unspecified hemorrhoids: Secondary | ICD-10-CM | POA: Diagnosis not present

## 2016-01-01 DIAGNOSIS — E7800 Pure hypercholesterolemia, unspecified: Secondary | ICD-10-CM | POA: Diagnosis not present

## 2016-01-01 DIAGNOSIS — E109 Type 1 diabetes mellitus without complications: Secondary | ICD-10-CM | POA: Diagnosis not present

## 2016-01-01 DIAGNOSIS — I10 Essential (primary) hypertension: Secondary | ICD-10-CM | POA: Diagnosis not present

## 2016-01-01 DIAGNOSIS — Z86010 Personal history of colonic polyps: Secondary | ICD-10-CM | POA: Diagnosis not present

## 2016-01-01 DIAGNOSIS — K5730 Diverticulosis of large intestine without perforation or abscess without bleeding: Secondary | ICD-10-CM | POA: Diagnosis not present

## 2016-01-01 DIAGNOSIS — Z1211 Encounter for screening for malignant neoplasm of colon: Secondary | ICD-10-CM | POA: Diagnosis not present

## 2016-01-01 DIAGNOSIS — R1032 Left lower quadrant pain: Secondary | ICD-10-CM | POA: Diagnosis not present

## 2016-01-01 DIAGNOSIS — Z09 Encounter for follow-up examination after completed treatment for conditions other than malignant neoplasm: Secondary | ICD-10-CM | POA: Diagnosis not present

## 2016-01-06 DIAGNOSIS — M545 Low back pain: Secondary | ICD-10-CM | POA: Diagnosis not present

## 2016-01-13 ENCOUNTER — Other Ambulatory Visit: Payer: Self-pay

## 2016-01-13 NOTE — Patient Outreach (Signed)
I called Mr. Darrell [name] and confirmed PHI.  I discussed making a home visit to discuss his medications and potential relationship to falls.  He stated he would appreciate it.  I will make a home visit on 01/20/16 at 1:30 pm. I have notified Tomasa Rand, RN, his nurse care manager.   Deanne Coffer, PharmD, [place] [phone]

## 2016-01-20 ENCOUNTER — Other Ambulatory Visit: Payer: Self-pay

## 2016-01-20 NOTE — Patient Outreach (Cosign Needed)
[place] [facility]) Care Management  [place]   01/20/2016  Khali [place]ahl 23-Jun-1941 [id number]  Subjective: Mr Drawdy is a 75 year old male who was referred to me due to his history of falls by Tomasa Rand, RN, his nurse care manager.  She wanted me to review his medications to determine if any of his medications could be contributing to his falls.  His partner is present with him today as well.  He was able to describe he previous 5 falls.  Two of his falls were related to balance (lost balance in garden while trying to get up and started to fall forward over corrected, over corrected again, and then fell; In kitchen, getting canned goods out of bottom cabinet, scrunched down, fell backwards).  The other falls he describes are not linked to balance.  He is getting out of bed, moving the trash can, mowing the grass, and moving around the house.  He does not check his blood pressure or blood sugar when he falls.  Upon questioning more about his insulin dosing, he stated he typically takes his Novolin 70/30 in the morning and at bedtime.  He also stated that there are times that if his blood sugar is elevated in the morning (above 150 mg/dl), he will take an additional 60 units at noon.  I explained that was adding too much insulin on board and may be dropping his blood sugars too low.  I asked if he was monitoring more often and he stated he was not.  He only is monitoring one to two times a day.  His last A1c was 5.9%.  He stated his blood sugars are between 130 and 150 in the morning and over 200 in the evening. He stated he does have enough strips to monitor more often if needed.  He also stated he is not monitoring his blood pressure when he falls as well.  He stated that is typically normal when he does monitor it.  He and his partner do not feel comfortable with their knowledge about a diabetic diet.  They would like more education about it.   Objective:   Encounter  Medications: Outpatient Encounter Prescriptions as of 01/20/2016  Medication Sig  . atorvastatin (LIPITOR) 20 MG tablet TAKE ONE TABLET BY MOUTH EVERY DAY  . buPROPion (WELLBUTRIN SR) 150 MG 12 hr tablet Take 300 mg by mouth daily.   . DULoxetine (CYMBALTA) 60 MG capsule Take 60 mg by mouth daily.  Marland Kitchen emtricitabine-rilpivir-tenofovir DF (COMPLERA) 200-25-300 MG tablet Take 1 tablet by mouth daily.  . fesoterodine (TOVIAZ) 8 MG TB24 tablet Take 8 mg by mouth daily.  Marland Kitchen HYDROcodone-acetaminophen (NORCO/VICODIN) 5-325 MG tablet Take 1 tablet by mouth every 6 (six) hours as needed.  . insulin NPH-regular Human (NOVOLIN 70/30) (70-30) 100 UNIT/ML injection Inject 60 Units into the skin 2 (two) times daily.  Marland Kitchen lisinopril (PRINIVIL,ZESTRIL) 20 MG tablet Take 20 mg by mouth daily.  . mirabegron ER (MYRBETRIQ) 25 MG TB24 tablet Take 25 mg by mouth daily.  . MULTIPLE VITAMIN PO Take 1 tablet by mouth daily.  . Omega-3 Fatty Acids (FISH OIL) 1200 MG CAPS Take 2 capsules by mouth 2 (two) times daily.  . risperiDONE (RISPERDAL) 1 MG tablet Take 1 mg by mouth at bedtime.  . Tamsulosin HCl (FLOMAX) 0.4 MG CAPS TAKE ONE CAPSULE BY MOUTH EVERY DAY  . traMADol (ULTRAM) 50 MG tablet Take 1 tablet by mouth every 6 (six) hours as needed.  . [  DISCONTINUED] tamsulosin (FLOMAX) 0.4 MG CAPS capsule Take 0.4 mg by mouth daily.   . [DISCONTINUED] KALETRA 200-50 MG per tablet TAKE TWO TABLETS BY MOUTH TWICE DAILY  . [DISCONTINUED] NORVIR 100 MG capsule TAKE ONE CAPSULE BY MOUTH EVERY DAY (Patient not taking: Reported on 12/30/2015)  . [DISCONTINUED] TRUVADA 200-300 MG per tablet TAKE ONE TABLET BY MOUTH EVERY DAY (Patient not taking: Reported on 12/30/2015)   No facility-administered encounter medications on file as of 01/20/2016.     Functional Status: In your present state of health, do you have any difficulty performing the following activities: 12/30/2015  Hearing? Y  Vision? Y  Difficulty concentrating or making  decisions? Y  Walking or climbing stairs? Y  Dressing or bathing? N  Doing errands, shopping? N  Preparing Food and eating ? N  Using the Toilet? N  In the past six months, have you accidently leaked urine? N  Do you have problems with loss of bowel control? N  Managing your Medications? Y  Managing your Finances? N  Housekeeping or managing your Housekeeping? N    Fall/Depression Screening: PHQ 2/9 Scores 12/30/2015  PHQ - 2 Score 1    Assessment:  Drugs sorted by system:  Neurologic/Psychologic: Bupropion, duloxetine, risperidone  Cardiovascular: atorvastatin, lisinopril   Pulmonary/Allergy: none  Gastrointestinal: none  Endocrine: Novolin 70/30  Renal: none  Topical: none  Pain: hydrocodone-acetaminophen, tramadol  Vitamins/Minerals: omega 3, multivitamin  Infectious Diseases: Complera  Miscellaneous: fesoterodine, myrbetriq   Duplications in therapy: none Gaps in therapy: none Medications to avoid in the elderly: none Drug interactions: none Other issues noted: Over use of Novolin 70/30.  This may be contributing to hypoglycemia.  Plan: 1.  No drug interactions noted.  When falls are related to medications, it is due to oversedation, orthostasis or hypoglycemia.  Oversedation is unlikely.  I have asked him to monitor his blood glucose and blood pressure more often to get a better picture of his readings.   2.  He was using Novolin 70/30 three times a day which may have been contributing to some low blood sugars. 3.  I have asked Tomasa Rand, RN to follow up with Mr. Marczak and his partner to complete some diabetes education around diet.   4.  I will follow up in 3 weeks to see how his blood sugars and blood pressures are running to see if any are contributing to his falls.   5.  I will send my note to his provider and his provider managing his insulin.    Deanne Coffer, PharmD, [place] [phone]

## 2016-01-21 DIAGNOSIS — Z79899 Other long term (current) drug therapy: Secondary | ICD-10-CM | POA: Diagnosis not present

## 2016-01-21 DIAGNOSIS — K5909 Other constipation: Secondary | ICD-10-CM | POA: Diagnosis not present

## 2016-01-21 DIAGNOSIS — F329 Major depressive disorder, single episode, unspecified: Secondary | ICD-10-CM | POA: Diagnosis not present

## 2016-01-21 DIAGNOSIS — F19982 Other psychoactive substance use, unspecified with psychoactive substance-induced sleep disorder: Secondary | ICD-10-CM | POA: Diagnosis not present

## 2016-01-21 DIAGNOSIS — E7800 Pure hypercholesterolemia, unspecified: Secondary | ICD-10-CM | POA: Diagnosis not present

## 2016-01-21 DIAGNOSIS — E109 Type 1 diabetes mellitus without complications: Secondary | ICD-10-CM | POA: Diagnosis not present

## 2016-01-21 DIAGNOSIS — R945 Abnormal results of liver function studies: Secondary | ICD-10-CM | POA: Diagnosis not present

## 2016-01-21 DIAGNOSIS — Z21 Asymptomatic human immunodeficiency virus [HIV] infection status: Secondary | ICD-10-CM | POA: Diagnosis not present

## 2016-01-21 DIAGNOSIS — F200 Paranoid schizophrenia: Secondary | ICD-10-CM | POA: Diagnosis not present

## 2016-01-26 DIAGNOSIS — M5136 Other intervertebral disc degeneration, lumbar region: Secondary | ICD-10-CM | POA: Diagnosis not present

## 2016-01-26 DIAGNOSIS — G8929 Other chronic pain: Secondary | ICD-10-CM | POA: Diagnosis not present

## 2016-01-26 DIAGNOSIS — M25562 Pain in left knee: Secondary | ICD-10-CM | POA: Diagnosis not present

## 2016-01-26 DIAGNOSIS — M25561 Pain in right knee: Secondary | ICD-10-CM | POA: Diagnosis not present

## 2016-01-26 DIAGNOSIS — M545 Low back pain: Secondary | ICD-10-CM | POA: Diagnosis not present

## 2016-01-26 DIAGNOSIS — G894 Chronic pain syndrome: Secondary | ICD-10-CM | POA: Diagnosis not present

## 2016-01-27 ENCOUNTER — Ambulatory Visit: Payer: Self-pay

## 2016-01-27 ENCOUNTER — Other Ambulatory Visit: Payer: Self-pay

## 2016-01-27 NOTE — Patient Outreach (Signed)
Telephone follow up: Placed call to patient for follow up. No answer.Left a message requesting a call back.  Plan: Will await a call back. If no response will call in 3-5 days.  Tomasa Rand, RN, BSN, CEN [facility] [employer] [phone]

## 2016-02-02 ENCOUNTER — Other Ambulatory Visit: Payer: Self-pay

## 2016-02-02 NOTE — Patient Outreach (Signed)
Telephone follow up: Placed call to patient with no answer. After hanging up phone rang and it was the patient.  Reviewed reasons for call. Reviewed concern of pharmacist about low blood sugar.  Patient reports to me that he does not think his falls are from low blood sugar. Patient reports that he sees spots when his blood sugar is low. Reports that he knows to eat something if this happens. Patient reports that he normally eats a tangerine.  Patient denies any episodes of low blood sugar in the last 3 weeks. States his lowest reading was 68.  Reports most of his readings are in the 200's. Reports eating grapes and crackers a lot in the last few week.  Patient reports that his CBG today was 118. Reports that he is monitoring his CBG twice a day. Reports that he is NOT taking any extra doses of insulin. States that he states 60 units twice a day.    Patient reports no recent falls but states that he had " 3 episodes of near falls but got his balance back"  Reports that he recently saw ortho and discuss pain control.    PLAN: Discussed with patient his current needs. Patient reports that he is interested in some DM education.  I will plan to mail DM education and follow up with patient in 3 weeks. Encouraged patient to call sooners for concerns.  THN CM Care Plan Problem One        Most Recent Value   Care Plan Problem One  Alteration in mobility related to frequentf falls.   Role Documenting the Problem One  Care Management [place] for Problem One  Active   THN Long Term Goal (31-90 days)  Patient will report a decrease in falls for the next 31 days.   THN Long Term Goal Start Date  12/30/15   [facility] Long Term Goal Met Date  02/02/16 [facility] met. Denies recent falls to me]   Interventions for Problem One Long Term Goal  reviewed non slip socks, use of cane. Discussed option for balance therapy or physical therapy( patient declined) reviewed importance of change of positions slowly.   THN CM Short Term Goal #1 (0-30 days)  Patient will record falls on [facility] calendar for the next 30 days   THN CM Short Term Goal #1 Start Date  12/30/15   [facility] CM Short Term Goal #1 Met Date  02/02/16 [[place].reports he is doing this]   Interventions for Short Term Goal #1  disucssed with patient the importance of keeping up with number of falls . He agreed to record in [facility] calendar.   THN CM Short Term Goal #2 (0-30 days)  Patient will report using  cane for the next 30 days   THN CM Short Term Goal #2 Start Date  12/30/15   [facility] CM Short Term Goal #2 Met Date  02/02/16 Bar[facility] Folk met]   Interventions for Short Term Goal #2  Enocuraged patient to use assistive device to see if this changes the number of falls. encouraged patient to consider removing rugs and wearing non slip socks.    [facility] CM Care Plan Problem Two        Most Recent Value   Care Plan Problem Two  Patient requesting DM educational material   Role Documenting the Problem Two  Care Management Coordinator   Care Plan for Problem Two  Active   THN CM Short Term Goal #1 (0-30 days)  Patient  will receive DM educational material and read this material in the next 30 days.   THN CM Short Term Goal #1 Start Date  02/02/16   Interventions for Short Term Goal #2   Reviewed DM and importance of taking all meds as prescribed. Will material printed EMMI DM material and will request mailings of living with DM booklet.Tomasa Rand, RN, BSN, CEN [place] Coordinator [phone]

## 2016-02-10 ENCOUNTER — Other Ambulatory Visit: Payer: Self-pay

## 2016-02-10 NOTE — Patient Outreach (Signed)
I called Mr. Ippolito to follow up on his falls and his insulin doses and his monitoring of his blood glucose.  I had to leave a HIPPA compliant message for him to call me back.  I will reach out again at a later date if I do not hear back from him.   Deanne Coffer, PharmD, [place] [phone]

## 2016-02-19 DIAGNOSIS — E7800 Pure hypercholesterolemia, unspecified: Secondary | ICD-10-CM | POA: Diagnosis not present

## 2016-02-19 DIAGNOSIS — R945 Abnormal results of liver function studies: Secondary | ICD-10-CM | POA: Diagnosis not present

## 2016-02-19 DIAGNOSIS — D696 Thrombocytopenia, unspecified: Secondary | ICD-10-CM | POA: Diagnosis not present

## 2016-02-19 DIAGNOSIS — Z21 Asymptomatic human immunodeficiency virus [HIV] infection status: Secondary | ICD-10-CM | POA: Diagnosis not present

## 2016-02-19 DIAGNOSIS — F329 Major depressive disorder, single episode, unspecified: Secondary | ICD-10-CM | POA: Diagnosis not present

## 2016-02-19 DIAGNOSIS — F19982 Other psychoactive substance use, unspecified with psychoactive substance-induced sleep disorder: Secondary | ICD-10-CM | POA: Diagnosis not present

## 2016-02-19 DIAGNOSIS — F200 Paranoid schizophrenia: Secondary | ICD-10-CM | POA: Diagnosis not present

## 2016-02-19 DIAGNOSIS — E109 Type 1 diabetes mellitus without complications: Secondary | ICD-10-CM | POA: Diagnosis not present

## 2016-02-19 DIAGNOSIS — K5909 Other constipation: Secondary | ICD-10-CM | POA: Diagnosis not present

## 2016-02-24 ENCOUNTER — Other Ambulatory Visit: Payer: Self-pay

## 2016-02-24 NOTE — Patient Outreach (Signed)
DM Follow up telephone call: Placed call to patient who reports that he is doing well. States that he received his DM education material in the mail and that he has read the material. Reports that he is doing a better job in controlling his blood sugars. Reports his CBG levels in the last 2 weeks has been 90-120.  Denies any hypoglycemic episodes.   Reports today's CBG of 120.   Falls: Patient reports that he is paying more attention to not moving suddenly. States that he has had 2 episodes of being off balance. Reports no falls.    Plan: Discussed with patient his goals.  At this time patient feels like he is doing well. Offered to refer patient to [place] [facility] and patient has respectfully declined. States that he get many phone calls from [facility] and feels like he is well connected.  I reviewed with patient how to reach a nurse if needed and ask to call me if he needs DM assistance in the future. Patient agreed.   Will close case to nursing at this time. Will notify Deanne Coffer who remains active with patient. Will send update to MD.  [facility] CM Care Plan Problem One        Most Recent Value   Care Plan Problem One  Alteration in mobility related to frequentf falls.   Role Documenting the Problem One  Care Management [place] for Problem One  Active   [facility] Long Term Goal (31-90 days)  Patient will report a decrease in falls for the next 31 days.   [facility] Long Term Goal Start Date  12/30/15   [facility] Long Term Goal Met Date  02/02/16 [facility] met. Denies recent falls to me]   Interventions for Problem One Long Term Goal  reviewed non slip socks, use of cane. Discussed option for balance therapy or physical therapy( patient declined) reviewed importance of change of positions slowly.   [facility] CM Short Term Goal #1 (0-30 days)  Patient will record falls on [facility] calendar for the next 30 days   [facility] CM Short Term Goal #1 Start Date  12/30/15   [facility] CM Short Term Goal #1 Met Date  02/02/16 [goal  mer.reports he is doing this]   Interventions for Short Term Goal #1  disucssed with patient the importance of keeping up with number of falls . He agreed to record in [facility] calendar.   [facility] CM Short Term Goal #2 (0-30 days)  Patient will report using  cane for the next 30 days   [facility] CM Short Term Goal #2 Start Date  12/30/15   [facility] CM Short Term Goal #2 Met Date  02/02/16 [facility] met]   Interventions for Short Term Goal #2  Enocuraged patient to use assistive device to see if this changes the number of falls. encouraged patient to consider removing rugs and wearing non slip socks.    [facility] CM Care Plan Problem Two        Most Recent Value   Care Plan Problem Two  Patient requesting DM educational material   Role Documenting the Problem Two  Care Management Coordinator   Care Plan for Problem Two  Active   [facility] CM Short Term Goal #1 (0-30 days)  Patient will receive DM educational material and read this material in the next 30 days.   [facility] CM Short Term Goal #1 Start Date  02/02/16   [facility] CM Short Term Goal #1 Met Date  02/24/16 [goal met.]   Interventions for Short Term Goal #2   Reviewed DM and importance of taking all meds as prescribed. Will material printed EMMI DM material and will request mailings of living with DM booklet.Tomasa Rand, RN, BSN, CEN [place] Coordinator [phone]

## 2016-02-26 ENCOUNTER — Other Ambulatory Visit: Payer: Self-pay

## 2016-02-26 NOTE — Patient Outreach (Signed)
I called Darrell [name] to follow up on his medications and falls.  He stated he was doing very well.  He stated he had not fallen since I saw him last (01/13/16).  He stated he had been dizzy but had not fallen.  He stated is taking all of his medications.  He is no longer taking his insulin (Novolin 70/30) three times a day.  He is just taking it twice a day.  His blood sugars are running between 106 to 126.  He also reported his blood pressure running around 125/75.  He stated he has no more pharmacy questions or needs.  I will close him out to pharmacy and close his case.  I will send a case closure letter to his provider as well.    Deanne Coffer, PharmD, [place] [phone]

## 2016-03-12 DIAGNOSIS — C61 Malignant neoplasm of prostate: Secondary | ICD-10-CM | POA: Diagnosis not present

## 2016-03-12 DIAGNOSIS — N3281 Overactive bladder: Secondary | ICD-10-CM | POA: Diagnosis not present

## 2016-03-15 DIAGNOSIS — M545 Low back pain: Secondary | ICD-10-CM | POA: Diagnosis not present

## 2016-03-15 DIAGNOSIS — G894 Chronic pain syndrome: Secondary | ICD-10-CM | POA: Diagnosis not present

## 2016-03-15 DIAGNOSIS — M25561 Pain in right knee: Secondary | ICD-10-CM | POA: Diagnosis not present

## 2016-03-15 DIAGNOSIS — Z8546 Personal history of malignant neoplasm of prostate: Secondary | ICD-10-CM | POA: Diagnosis not present

## 2016-03-15 DIAGNOSIS — Z21 Asymptomatic human immunodeficiency virus [HIV] infection status: Secondary | ICD-10-CM | POA: Diagnosis not present

## 2016-03-15 DIAGNOSIS — Z1389 Encounter for screening for other disorder: Secondary | ICD-10-CM | POA: Diagnosis not present

## 2016-03-25 DIAGNOSIS — E109 Type 1 diabetes mellitus without complications: Secondary | ICD-10-CM | POA: Diagnosis not present

## 2016-03-25 DIAGNOSIS — F200 Paranoid schizophrenia: Secondary | ICD-10-CM | POA: Diagnosis not present

## 2016-03-25 DIAGNOSIS — N401 Enlarged prostate with lower urinary tract symptoms: Secondary | ICD-10-CM | POA: Diagnosis not present

## 2016-03-25 DIAGNOSIS — F329 Major depressive disorder, single episode, unspecified: Secondary | ICD-10-CM | POA: Diagnosis not present

## 2016-03-25 DIAGNOSIS — Z21 Asymptomatic human immunodeficiency virus [HIV] infection status: Secondary | ICD-10-CM | POA: Diagnosis not present

## 2016-03-25 DIAGNOSIS — E7800 Pure hypercholesterolemia, unspecified: Secondary | ICD-10-CM | POA: Diagnosis not present

## 2016-03-25 DIAGNOSIS — F19982 Other psychoactive substance use, unspecified with psychoactive substance-induced sleep disorder: Secondary | ICD-10-CM | POA: Diagnosis not present

## 2016-03-25 DIAGNOSIS — E1159 Type 2 diabetes mellitus with other circulatory complications: Secondary | ICD-10-CM | POA: Diagnosis not present

## 2016-03-25 DIAGNOSIS — K5909 Other constipation: Secondary | ICD-10-CM | POA: Diagnosis not present

## 2016-03-25 DIAGNOSIS — R945 Abnormal results of liver function studies: Secondary | ICD-10-CM | POA: Diagnosis not present

## 2016-04-01 DIAGNOSIS — Z79899 Other long term (current) drug therapy: Secondary | ICD-10-CM | POA: Diagnosis not present

## 2016-04-01 DIAGNOSIS — B20 Human immunodeficiency virus [HIV] disease: Secondary | ICD-10-CM | POA: Diagnosis not present

## 2016-04-08 DIAGNOSIS — B20 Human immunodeficiency virus [HIV] disease: Secondary | ICD-10-CM | POA: Diagnosis not present

## 2016-04-08 DIAGNOSIS — E119 Type 2 diabetes mellitus without complications: Secondary | ICD-10-CM | POA: Diagnosis not present

## 2016-04-08 DIAGNOSIS — Z8546 Personal history of malignant neoplasm of prostate: Secondary | ICD-10-CM | POA: Diagnosis not present

## 2016-04-15 DIAGNOSIS — B20 Human immunodeficiency virus [HIV] disease: Secondary | ICD-10-CM | POA: Diagnosis not present

## 2016-04-23 DIAGNOSIS — F200 Paranoid schizophrenia: Secondary | ICD-10-CM | POA: Diagnosis not present

## 2016-04-23 DIAGNOSIS — K5909 Other constipation: Secondary | ICD-10-CM | POA: Diagnosis not present

## 2016-04-23 DIAGNOSIS — E7800 Pure hypercholesterolemia, unspecified: Secondary | ICD-10-CM | POA: Diagnosis not present

## 2016-04-23 DIAGNOSIS — B20 Human immunodeficiency virus [HIV] disease: Secondary | ICD-10-CM | POA: Diagnosis not present

## 2016-04-23 DIAGNOSIS — F329 Major depressive disorder, single episode, unspecified: Secondary | ICD-10-CM | POA: Diagnosis not present

## 2016-04-23 DIAGNOSIS — R945 Abnormal results of liver function studies: Secondary | ICD-10-CM | POA: Diagnosis not present

## 2016-04-23 DIAGNOSIS — N401 Enlarged prostate with lower urinary tract symptoms: Secondary | ICD-10-CM | POA: Diagnosis not present

## 2016-04-23 DIAGNOSIS — E109 Type 1 diabetes mellitus without complications: Secondary | ICD-10-CM | POA: Diagnosis not present

## 2016-04-23 DIAGNOSIS — F19982 Other psychoactive substance use, unspecified with psychoactive substance-induced sleep disorder: Secondary | ICD-10-CM | POA: Diagnosis not present

## 2016-04-29 DIAGNOSIS — M47816 Spondylosis without myelopathy or radiculopathy, lumbar region: Secondary | ICD-10-CM | POA: Diagnosis not present

## 2016-04-29 DIAGNOSIS — M545 Low back pain: Secondary | ICD-10-CM | POA: Diagnosis not present

## 2016-04-29 DIAGNOSIS — M4806 Spinal stenosis, lumbar region: Secondary | ICD-10-CM | POA: Diagnosis not present

## 2016-04-29 DIAGNOSIS — M5136 Other intervertebral disc degeneration, lumbar region: Secondary | ICD-10-CM | POA: Diagnosis not present

## 2016-04-29 DIAGNOSIS — G8929 Other chronic pain: Secondary | ICD-10-CM | POA: Diagnosis not present

## 2016-05-20 DIAGNOSIS — M545 Low back pain: Secondary | ICD-10-CM | POA: Diagnosis not present

## 2016-05-20 DIAGNOSIS — M5136 Other intervertebral disc degeneration, lumbar region: Secondary | ICD-10-CM | POA: Diagnosis not present

## 2016-05-20 DIAGNOSIS — Z96653 Presence of artificial knee joint, bilateral: Secondary | ICD-10-CM | POA: Diagnosis not present

## 2016-05-20 DIAGNOSIS — F4542 Pain disorder with related psychological factors: Secondary | ICD-10-CM | POA: Diagnosis not present

## 2016-05-20 DIAGNOSIS — G894 Chronic pain syndrome: Secondary | ICD-10-CM | POA: Diagnosis not present

## 2016-05-20 DIAGNOSIS — G8929 Other chronic pain: Secondary | ICD-10-CM | POA: Diagnosis not present

## 2016-05-21 DIAGNOSIS — E109 Type 1 diabetes mellitus without complications: Secondary | ICD-10-CM | POA: Diagnosis not present

## 2016-05-21 DIAGNOSIS — D696 Thrombocytopenia, unspecified: Secondary | ICD-10-CM | POA: Diagnosis not present

## 2016-05-21 DIAGNOSIS — E1159 Type 2 diabetes mellitus with other circulatory complications: Secondary | ICD-10-CM | POA: Diagnosis not present

## 2016-05-21 DIAGNOSIS — B20 Human immunodeficiency virus [HIV] disease: Secondary | ICD-10-CM | POA: Diagnosis not present

## 2016-05-21 DIAGNOSIS — F19982 Other psychoactive substance use, unspecified with psychoactive substance-induced sleep disorder: Secondary | ICD-10-CM | POA: Diagnosis not present

## 2016-05-21 DIAGNOSIS — M5416 Radiculopathy, lumbar region: Secondary | ICD-10-CM | POA: Diagnosis not present

## 2016-05-21 DIAGNOSIS — E7800 Pure hypercholesterolemia, unspecified: Secondary | ICD-10-CM | POA: Diagnosis not present

## 2016-05-21 DIAGNOSIS — N401 Enlarged prostate with lower urinary tract symptoms: Secondary | ICD-10-CM | POA: Diagnosis not present

## 2016-05-21 DIAGNOSIS — F200 Paranoid schizophrenia: Secondary | ICD-10-CM | POA: Diagnosis not present

## 2016-06-14 DIAGNOSIS — C61 Malignant neoplasm of prostate: Secondary | ICD-10-CM | POA: Diagnosis not present

## 2016-06-14 DIAGNOSIS — N3281 Overactive bladder: Secondary | ICD-10-CM | POA: Diagnosis not present

## 2016-06-18 DIAGNOSIS — Z23 Encounter for immunization: Secondary | ICD-10-CM | POA: Diagnosis not present

## 2016-06-18 DIAGNOSIS — D696 Thrombocytopenia, unspecified: Secondary | ICD-10-CM | POA: Diagnosis not present

## 2016-06-18 DIAGNOSIS — F200 Paranoid schizophrenia: Secondary | ICD-10-CM | POA: Diagnosis not present

## 2016-06-18 DIAGNOSIS — N401 Enlarged prostate with lower urinary tract symptoms: Secondary | ICD-10-CM | POA: Diagnosis not present

## 2016-06-18 DIAGNOSIS — E7800 Pure hypercholesterolemia, unspecified: Secondary | ICD-10-CM | POA: Diagnosis not present

## 2016-06-18 DIAGNOSIS — F19982 Other psychoactive substance use, unspecified with psychoactive substance-induced sleep disorder: Secondary | ICD-10-CM | POA: Diagnosis not present

## 2016-06-18 DIAGNOSIS — M5416 Radiculopathy, lumbar region: Secondary | ICD-10-CM | POA: Diagnosis not present

## 2016-06-18 DIAGNOSIS — E1159 Type 2 diabetes mellitus with other circulatory complications: Secondary | ICD-10-CM | POA: Diagnosis not present

## 2016-06-18 DIAGNOSIS — Z79899 Other long term (current) drug therapy: Secondary | ICD-10-CM | POA: Diagnosis not present

## 2016-06-18 DIAGNOSIS — L600 Ingrowing nail: Secondary | ICD-10-CM | POA: Diagnosis not present

## 2016-06-30 DIAGNOSIS — M47817 Spondylosis without myelopathy or radiculopathy, lumbosacral region: Secondary | ICD-10-CM | POA: Insufficient documentation

## 2016-07-01 DIAGNOSIS — M545 Low back pain: Secondary | ICD-10-CM | POA: Diagnosis not present

## 2016-07-01 DIAGNOSIS — M47817 Spondylosis without myelopathy or radiculopathy, lumbosacral region: Secondary | ICD-10-CM | POA: Diagnosis not present

## 2016-07-01 DIAGNOSIS — F329 Major depressive disorder, single episode, unspecified: Secondary | ICD-10-CM | POA: Diagnosis not present

## 2016-07-01 DIAGNOSIS — E11618 Type 2 diabetes mellitus with other diabetic arthropathy: Secondary | ICD-10-CM | POA: Diagnosis not present

## 2016-07-01 DIAGNOSIS — Z79899 Other long term (current) drug therapy: Secondary | ICD-10-CM | POA: Diagnosis not present

## 2016-07-01 DIAGNOSIS — Z794 Long term (current) use of insulin: Secondary | ICD-10-CM | POA: Diagnosis not present

## 2016-07-01 DIAGNOSIS — M1288 Other specific arthropathies, not elsewhere classified, other specified site: Secondary | ICD-10-CM | POA: Diagnosis not present

## 2016-07-01 DIAGNOSIS — Z7982 Long term (current) use of aspirin: Secondary | ICD-10-CM | POA: Diagnosis not present

## 2016-07-01 DIAGNOSIS — I10 Essential (primary) hypertension: Secondary | ICD-10-CM | POA: Diagnosis not present

## 2016-07-08 DIAGNOSIS — E119 Type 2 diabetes mellitus without complications: Secondary | ICD-10-CM | POA: Diagnosis not present

## 2016-07-08 DIAGNOSIS — Z6834 Body mass index (BMI) 34.0-34.9, adult: Secondary | ICD-10-CM | POA: Diagnosis not present

## 2016-07-08 DIAGNOSIS — E669 Obesity, unspecified: Secondary | ICD-10-CM | POA: Diagnosis not present

## 2016-07-16 DIAGNOSIS — B20 Human immunodeficiency virus [HIV] disease: Secondary | ICD-10-CM | POA: Diagnosis not present

## 2016-07-16 DIAGNOSIS — M5416 Radiculopathy, lumbar region: Secondary | ICD-10-CM | POA: Diagnosis not present

## 2016-07-16 DIAGNOSIS — F200 Paranoid schizophrenia: Secondary | ICD-10-CM | POA: Diagnosis not present

## 2016-07-16 DIAGNOSIS — E7800 Pure hypercholesterolemia, unspecified: Secondary | ICD-10-CM | POA: Diagnosis not present

## 2016-07-16 DIAGNOSIS — F19982 Other psychoactive substance use, unspecified with psychoactive substance-induced sleep disorder: Secondary | ICD-10-CM | POA: Diagnosis not present

## 2016-07-16 DIAGNOSIS — N401 Enlarged prostate with lower urinary tract symptoms: Secondary | ICD-10-CM | POA: Diagnosis not present

## 2016-07-16 DIAGNOSIS — D696 Thrombocytopenia, unspecified: Secondary | ICD-10-CM | POA: Diagnosis not present

## 2016-07-16 DIAGNOSIS — F329 Major depressive disorder, single episode, unspecified: Secondary | ICD-10-CM | POA: Diagnosis not present

## 2016-07-16 DIAGNOSIS — E1159 Type 2 diabetes mellitus with other circulatory complications: Secondary | ICD-10-CM | POA: Diagnosis not present

## 2016-07-19 DIAGNOSIS — L600 Ingrowing nail: Secondary | ICD-10-CM | POA: Diagnosis not present

## 2016-07-19 DIAGNOSIS — L03032 Cellulitis of left toe: Secondary | ICD-10-CM | POA: Diagnosis not present

## 2016-07-27 DIAGNOSIS — G894 Chronic pain syndrome: Secondary | ICD-10-CM | POA: Diagnosis not present

## 2016-07-27 DIAGNOSIS — M5136 Other intervertebral disc degeneration, lumbar region: Secondary | ICD-10-CM | POA: Diagnosis not present

## 2016-07-27 DIAGNOSIS — G8929 Other chronic pain: Secondary | ICD-10-CM | POA: Diagnosis not present

## 2016-07-27 DIAGNOSIS — M1288 Other specific arthropathies, not elsewhere classified, other specified site: Secondary | ICD-10-CM | POA: Diagnosis not present

## 2016-07-27 DIAGNOSIS — M545 Low back pain: Secondary | ICD-10-CM | POA: Diagnosis not present

## 2016-08-02 DIAGNOSIS — L600 Ingrowing nail: Secondary | ICD-10-CM | POA: Diagnosis not present

## 2016-08-02 DIAGNOSIS — L03031 Cellulitis of right toe: Secondary | ICD-10-CM | POA: Diagnosis not present

## 2016-08-05 DIAGNOSIS — H26493 Other secondary cataract, bilateral: Secondary | ICD-10-CM | POA: Diagnosis not present

## 2016-08-05 DIAGNOSIS — H524 Presbyopia: Secondary | ICD-10-CM | POA: Diagnosis not present

## 2016-08-05 DIAGNOSIS — E113293 Type 2 diabetes mellitus with mild nonproliferative diabetic retinopathy without macular edema, bilateral: Secondary | ICD-10-CM | POA: Diagnosis not present

## 2016-08-13 DIAGNOSIS — N401 Enlarged prostate with lower urinary tract symptoms: Secondary | ICD-10-CM | POA: Diagnosis not present

## 2016-08-13 DIAGNOSIS — F200 Paranoid schizophrenia: Secondary | ICD-10-CM | POA: Diagnosis not present

## 2016-08-13 DIAGNOSIS — E109 Type 1 diabetes mellitus without complications: Secondary | ICD-10-CM | POA: Diagnosis not present

## 2016-08-13 DIAGNOSIS — E7800 Pure hypercholesterolemia, unspecified: Secondary | ICD-10-CM | POA: Diagnosis not present

## 2016-08-13 DIAGNOSIS — F19980 Other psychoactive substance use, unspecified with psychoactive substance-induced anxiety disorder: Secondary | ICD-10-CM | POA: Diagnosis not present

## 2016-08-13 DIAGNOSIS — E1159 Type 2 diabetes mellitus with other circulatory complications: Secondary | ICD-10-CM | POA: Diagnosis not present

## 2016-08-13 DIAGNOSIS — D696 Thrombocytopenia, unspecified: Secondary | ICD-10-CM | POA: Diagnosis not present

## 2016-08-13 DIAGNOSIS — B20 Human immunodeficiency virus [HIV] disease: Secondary | ICD-10-CM | POA: Diagnosis not present

## 2016-08-13 DIAGNOSIS — F329 Major depressive disorder, single episode, unspecified: Secondary | ICD-10-CM | POA: Diagnosis not present

## 2016-08-13 DIAGNOSIS — M5416 Radiculopathy, lumbar region: Secondary | ICD-10-CM | POA: Diagnosis not present

## 2016-08-23 DIAGNOSIS — L600 Ingrowing nail: Secondary | ICD-10-CM | POA: Diagnosis not present

## 2016-09-01 DIAGNOSIS — Z8546 Personal history of malignant neoplasm of prostate: Secondary | ICD-10-CM | POA: Diagnosis not present

## 2016-09-01 DIAGNOSIS — I10 Essential (primary) hypertension: Secondary | ICD-10-CM | POA: Diagnosis not present

## 2016-09-01 DIAGNOSIS — Z8589 Personal history of malignant neoplasm of other organs and systems: Secondary | ICD-10-CM | POA: Diagnosis not present

## 2016-09-01 DIAGNOSIS — G4700 Insomnia, unspecified: Secondary | ICD-10-CM | POA: Diagnosis not present

## 2016-09-01 DIAGNOSIS — E785 Hyperlipidemia, unspecified: Secondary | ICD-10-CM | POA: Diagnosis not present

## 2016-09-01 DIAGNOSIS — Z79899 Other long term (current) drug therapy: Secondary | ICD-10-CM | POA: Diagnosis not present

## 2016-09-01 DIAGNOSIS — F519 Sleep disorder not due to a substance or known physiological condition, unspecified: Secondary | ICD-10-CM | POA: Diagnosis not present

## 2016-09-01 DIAGNOSIS — E119 Type 2 diabetes mellitus without complications: Secondary | ICD-10-CM | POA: Diagnosis not present

## 2016-09-01 DIAGNOSIS — B20 Human immunodeficiency virus [HIV] disease: Secondary | ICD-10-CM | POA: Diagnosis not present

## 2016-09-09 DIAGNOSIS — I10 Essential (primary) hypertension: Secondary | ICD-10-CM | POA: Diagnosis not present

## 2016-09-09 DIAGNOSIS — Z79899 Other long term (current) drug therapy: Secondary | ICD-10-CM | POA: Diagnosis not present

## 2016-09-09 DIAGNOSIS — Z8546 Personal history of malignant neoplasm of prostate: Secondary | ICD-10-CM | POA: Diagnosis not present

## 2016-09-09 DIAGNOSIS — F519 Sleep disorder not due to a substance or known physiological condition, unspecified: Secondary | ICD-10-CM | POA: Diagnosis not present

## 2016-09-09 DIAGNOSIS — G4700 Insomnia, unspecified: Secondary | ICD-10-CM | POA: Diagnosis not present

## 2016-09-09 DIAGNOSIS — B20 Human immunodeficiency virus [HIV] disease: Secondary | ICD-10-CM | POA: Diagnosis not present

## 2016-09-09 DIAGNOSIS — E119 Type 2 diabetes mellitus without complications: Secondary | ICD-10-CM | POA: Diagnosis not present

## 2016-09-09 DIAGNOSIS — Z8589 Personal history of malignant neoplasm of other organs and systems: Secondary | ICD-10-CM | POA: Diagnosis not present

## 2016-09-09 DIAGNOSIS — E785 Hyperlipidemia, unspecified: Secondary | ICD-10-CM | POA: Diagnosis not present

## 2016-09-10 DIAGNOSIS — M5416 Radiculopathy, lumbar region: Secondary | ICD-10-CM | POA: Diagnosis not present

## 2016-09-10 DIAGNOSIS — D696 Thrombocytopenia, unspecified: Secondary | ICD-10-CM | POA: Diagnosis not present

## 2016-09-10 DIAGNOSIS — E7800 Pure hypercholesterolemia, unspecified: Secondary | ICD-10-CM | POA: Diagnosis not present

## 2016-09-10 DIAGNOSIS — F329 Major depressive disorder, single episode, unspecified: Secondary | ICD-10-CM | POA: Diagnosis not present

## 2016-09-10 DIAGNOSIS — E1159 Type 2 diabetes mellitus with other circulatory complications: Secondary | ICD-10-CM | POA: Diagnosis not present

## 2016-09-10 DIAGNOSIS — F19982 Other psychoactive substance use, unspecified with psychoactive substance-induced sleep disorder: Secondary | ICD-10-CM | POA: Diagnosis not present

## 2016-09-10 DIAGNOSIS — B20 Human immunodeficiency virus [HIV] disease: Secondary | ICD-10-CM | POA: Diagnosis not present

## 2016-09-10 DIAGNOSIS — F200 Paranoid schizophrenia: Secondary | ICD-10-CM | POA: Diagnosis not present

## 2016-09-10 DIAGNOSIS — N401 Enlarged prostate with lower urinary tract symptoms: Secondary | ICD-10-CM | POA: Diagnosis not present

## 2016-09-22 DIAGNOSIS — B20 Human immunodeficiency virus [HIV] disease: Secondary | ICD-10-CM | POA: Diagnosis not present

## 2016-09-27 DIAGNOSIS — J189 Pneumonia, unspecified organism: Secondary | ICD-10-CM

## 2016-09-27 HISTORY — DX: Pneumonia, unspecified organism: J18.9

## 2016-10-08 DIAGNOSIS — E1159 Type 2 diabetes mellitus with other circulatory complications: Secondary | ICD-10-CM | POA: Diagnosis not present

## 2016-10-08 DIAGNOSIS — M5416 Radiculopathy, lumbar region: Secondary | ICD-10-CM | POA: Diagnosis not present

## 2016-10-08 DIAGNOSIS — F200 Paranoid schizophrenia: Secondary | ICD-10-CM | POA: Diagnosis not present

## 2016-10-08 DIAGNOSIS — D696 Thrombocytopenia, unspecified: Secondary | ICD-10-CM | POA: Diagnosis not present

## 2016-10-08 DIAGNOSIS — E7800 Pure hypercholesterolemia, unspecified: Secondary | ICD-10-CM | POA: Diagnosis not present

## 2016-10-08 DIAGNOSIS — Z79891 Long term (current) use of opiate analgesic: Secondary | ICD-10-CM | POA: Diagnosis not present

## 2016-10-08 DIAGNOSIS — R42 Dizziness and giddiness: Secondary | ICD-10-CM | POA: Diagnosis not present

## 2016-10-08 DIAGNOSIS — N401 Enlarged prostate with lower urinary tract symptoms: Secondary | ICD-10-CM | POA: Diagnosis not present

## 2016-10-08 DIAGNOSIS — F1120 Opioid dependence, uncomplicated: Secondary | ICD-10-CM | POA: Diagnosis not present

## 2016-10-08 DIAGNOSIS — F19982 Other psychoactive substance use, unspecified with psychoactive substance-induced sleep disorder: Secondary | ICD-10-CM | POA: Diagnosis not present

## 2016-11-02 DIAGNOSIS — Z7982 Long term (current) use of aspirin: Secondary | ICD-10-CM | POA: Diagnosis not present

## 2016-11-02 DIAGNOSIS — E11618 Type 2 diabetes mellitus with other diabetic arthropathy: Secondary | ICD-10-CM | POA: Diagnosis not present

## 2016-11-02 DIAGNOSIS — Z885 Allergy status to narcotic agent status: Secondary | ICD-10-CM | POA: Diagnosis not present

## 2016-11-02 DIAGNOSIS — I10 Essential (primary) hypertension: Secondary | ICD-10-CM | POA: Diagnosis not present

## 2016-11-02 DIAGNOSIS — M1288 Other specific arthropathies, not elsewhere classified, other specified site: Secondary | ICD-10-CM | POA: Diagnosis not present

## 2016-11-02 DIAGNOSIS — M545 Low back pain: Secondary | ICD-10-CM | POA: Diagnosis not present

## 2016-11-02 DIAGNOSIS — Z794 Long term (current) use of insulin: Secondary | ICD-10-CM | POA: Diagnosis not present

## 2016-11-02 DIAGNOSIS — C61 Malignant neoplasm of prostate: Secondary | ICD-10-CM | POA: Diagnosis not present

## 2016-11-02 DIAGNOSIS — F329 Major depressive disorder, single episode, unspecified: Secondary | ICD-10-CM | POA: Diagnosis not present

## 2016-11-02 DIAGNOSIS — M47817 Spondylosis without myelopathy or radiculopathy, lumbosacral region: Secondary | ICD-10-CM | POA: Diagnosis not present

## 2016-11-02 DIAGNOSIS — Z21 Asymptomatic human immunodeficiency virus [HIV] infection status: Secondary | ICD-10-CM | POA: Diagnosis not present

## 2016-11-05 DIAGNOSIS — B20 Human immunodeficiency virus [HIV] disease: Secondary | ICD-10-CM | POA: Diagnosis not present

## 2016-11-05 DIAGNOSIS — M5416 Radiculopathy, lumbar region: Secondary | ICD-10-CM | POA: Diagnosis not present

## 2016-11-05 DIAGNOSIS — F329 Major depressive disorder, single episode, unspecified: Secondary | ICD-10-CM | POA: Diagnosis not present

## 2016-11-05 DIAGNOSIS — N401 Enlarged prostate with lower urinary tract symptoms: Secondary | ICD-10-CM | POA: Diagnosis not present

## 2016-11-05 DIAGNOSIS — D696 Thrombocytopenia, unspecified: Secondary | ICD-10-CM | POA: Diagnosis not present

## 2016-11-05 DIAGNOSIS — K5909 Other constipation: Secondary | ICD-10-CM | POA: Diagnosis not present

## 2016-11-05 DIAGNOSIS — E7800 Pure hypercholesterolemia, unspecified: Secondary | ICD-10-CM | POA: Diagnosis not present

## 2016-11-05 DIAGNOSIS — F200 Paranoid schizophrenia: Secondary | ICD-10-CM | POA: Diagnosis not present

## 2016-11-05 DIAGNOSIS — E1159 Type 2 diabetes mellitus with other circulatory complications: Secondary | ICD-10-CM | POA: Diagnosis not present

## 2016-11-08 DIAGNOSIS — Z6836 Body mass index (BMI) 36.0-36.9, adult: Secondary | ICD-10-CM | POA: Diagnosis not present

## 2016-11-08 DIAGNOSIS — E119 Type 2 diabetes mellitus without complications: Secondary | ICD-10-CM | POA: Diagnosis not present

## 2016-11-08 DIAGNOSIS — E669 Obesity, unspecified: Secondary | ICD-10-CM | POA: Diagnosis not present

## 2016-11-16 DIAGNOSIS — G8929 Other chronic pain: Secondary | ICD-10-CM | POA: Diagnosis not present

## 2016-11-16 DIAGNOSIS — M25551 Pain in right hip: Secondary | ICD-10-CM | POA: Diagnosis not present

## 2016-11-16 DIAGNOSIS — M545 Low back pain: Secondary | ICD-10-CM | POA: Diagnosis not present

## 2016-11-16 DIAGNOSIS — M25552 Pain in left hip: Secondary | ICD-10-CM | POA: Diagnosis not present

## 2016-11-16 DIAGNOSIS — G894 Chronic pain syndrome: Secondary | ICD-10-CM | POA: Diagnosis not present

## 2016-11-16 DIAGNOSIS — M5136 Other intervertebral disc degeneration, lumbar region: Secondary | ICD-10-CM | POA: Diagnosis not present

## 2016-11-16 DIAGNOSIS — M1288 Other specific arthropathies, not elsewhere classified, other specified site: Secondary | ICD-10-CM | POA: Diagnosis not present

## 2016-11-17 DIAGNOSIS — R200 Anesthesia of skin: Secondary | ICD-10-CM | POA: Diagnosis not present

## 2016-11-17 DIAGNOSIS — R202 Paresthesia of skin: Secondary | ICD-10-CM | POA: Diagnosis not present

## 2016-11-17 DIAGNOSIS — R2681 Unsteadiness on feet: Secondary | ICD-10-CM | POA: Insufficient documentation

## 2016-11-17 DIAGNOSIS — R42 Dizziness and giddiness: Secondary | ICD-10-CM | POA: Diagnosis not present

## 2016-11-25 DIAGNOSIS — M5418 Radiculopathy, sacral and sacrococcygeal region: Secondary | ICD-10-CM | POA: Diagnosis not present

## 2016-12-03 DIAGNOSIS — D696 Thrombocytopenia, unspecified: Secondary | ICD-10-CM | POA: Diagnosis not present

## 2016-12-03 DIAGNOSIS — E109 Type 1 diabetes mellitus without complications: Secondary | ICD-10-CM | POA: Diagnosis not present

## 2016-12-03 DIAGNOSIS — K5909 Other constipation: Secondary | ICD-10-CM | POA: Diagnosis not present

## 2016-12-03 DIAGNOSIS — F19982 Other psychoactive substance use, unspecified with psychoactive substance-induced sleep disorder: Secondary | ICD-10-CM | POA: Diagnosis not present

## 2016-12-03 DIAGNOSIS — F200 Paranoid schizophrenia: Secondary | ICD-10-CM | POA: Diagnosis not present

## 2016-12-03 DIAGNOSIS — E7800 Pure hypercholesterolemia, unspecified: Secondary | ICD-10-CM | POA: Diagnosis not present

## 2016-12-03 DIAGNOSIS — N401 Enlarged prostate with lower urinary tract symptoms: Secondary | ICD-10-CM | POA: Diagnosis not present

## 2016-12-03 DIAGNOSIS — M5416 Radiculopathy, lumbar region: Secondary | ICD-10-CM | POA: Diagnosis not present

## 2016-12-03 DIAGNOSIS — F341 Dysthymic disorder: Secondary | ICD-10-CM | POA: Diagnosis not present

## 2016-12-03 DIAGNOSIS — E1159 Type 2 diabetes mellitus with other circulatory complications: Secondary | ICD-10-CM | POA: Diagnosis not present

## 2016-12-03 DIAGNOSIS — B20 Human immunodeficiency virus [HIV] disease: Secondary | ICD-10-CM | POA: Diagnosis not present

## 2016-12-08 DIAGNOSIS — R2681 Unsteadiness on feet: Secondary | ICD-10-CM | POA: Diagnosis not present

## 2016-12-08 DIAGNOSIS — R202 Paresthesia of skin: Secondary | ICD-10-CM | POA: Diagnosis not present

## 2016-12-08 DIAGNOSIS — R413 Other amnesia: Secondary | ICD-10-CM | POA: Diagnosis not present

## 2016-12-08 DIAGNOSIS — R200 Anesthesia of skin: Secondary | ICD-10-CM | POA: Diagnosis not present

## 2016-12-14 DIAGNOSIS — C61 Malignant neoplasm of prostate: Secondary | ICD-10-CM | POA: Diagnosis not present

## 2016-12-14 DIAGNOSIS — R3129 Other microscopic hematuria: Secondary | ICD-10-CM | POA: Diagnosis not present

## 2016-12-15 DIAGNOSIS — R2681 Unsteadiness on feet: Secondary | ICD-10-CM | POA: Diagnosis not present

## 2016-12-15 DIAGNOSIS — R413 Other amnesia: Secondary | ICD-10-CM | POA: Diagnosis not present

## 2016-12-22 DIAGNOSIS — R413 Other amnesia: Secondary | ICD-10-CM | POA: Diagnosis not present

## 2016-12-22 DIAGNOSIS — R202 Paresthesia of skin: Secondary | ICD-10-CM | POA: Diagnosis not present

## 2016-12-22 DIAGNOSIS — R200 Anesthesia of skin: Secondary | ICD-10-CM | POA: Diagnosis not present

## 2016-12-22 DIAGNOSIS — J330 Polyp of nasal cavity: Secondary | ICD-10-CM | POA: Diagnosis not present

## 2016-12-22 DIAGNOSIS — R2681 Unsteadiness on feet: Secondary | ICD-10-CM | POA: Diagnosis not present

## 2016-12-31 DIAGNOSIS — N401 Enlarged prostate with lower urinary tract symptoms: Secondary | ICD-10-CM | POA: Diagnosis not present

## 2016-12-31 DIAGNOSIS — F200 Paranoid schizophrenia: Secondary | ICD-10-CM | POA: Diagnosis not present

## 2016-12-31 DIAGNOSIS — Z1389 Encounter for screening for other disorder: Secondary | ICD-10-CM | POA: Diagnosis not present

## 2016-12-31 DIAGNOSIS — F341 Dysthymic disorder: Secondary | ICD-10-CM | POA: Diagnosis not present

## 2016-12-31 DIAGNOSIS — E7800 Pure hypercholesterolemia, unspecified: Secondary | ICD-10-CM | POA: Diagnosis not present

## 2016-12-31 DIAGNOSIS — M5416 Radiculopathy, lumbar region: Secondary | ICD-10-CM | POA: Diagnosis not present

## 2016-12-31 DIAGNOSIS — F19982 Other psychoactive substance use, unspecified with psychoactive substance-induced sleep disorder: Secondary | ICD-10-CM | POA: Diagnosis not present

## 2016-12-31 DIAGNOSIS — D696 Thrombocytopenia, unspecified: Secondary | ICD-10-CM | POA: Diagnosis not present

## 2016-12-31 DIAGNOSIS — Z9181 History of falling: Secondary | ICD-10-CM | POA: Diagnosis not present

## 2017-01-20 DIAGNOSIS — M4726 Other spondylosis with radiculopathy, lumbar region: Secondary | ICD-10-CM | POA: Diagnosis not present

## 2017-01-20 DIAGNOSIS — Z96653 Presence of artificial knee joint, bilateral: Secondary | ICD-10-CM | POA: Diagnosis not present

## 2017-01-20 DIAGNOSIS — B20 Human immunodeficiency virus [HIV] disease: Secondary | ICD-10-CM | POA: Diagnosis not present

## 2017-01-20 DIAGNOSIS — M1288 Other specific arthropathies, not elsewhere classified, other specified site: Secondary | ICD-10-CM | POA: Diagnosis not present

## 2017-01-20 DIAGNOSIS — F418 Other specified anxiety disorders: Secondary | ICD-10-CM | POA: Diagnosis not present

## 2017-01-20 DIAGNOSIS — M5116 Intervertebral disc disorders with radiculopathy, lumbar region: Secondary | ICD-10-CM | POA: Diagnosis not present

## 2017-01-20 DIAGNOSIS — M4697 Unspecified inflammatory spondylopathy, lumbosacral region: Secondary | ICD-10-CM | POA: Diagnosis not present

## 2017-01-20 DIAGNOSIS — I10 Essential (primary) hypertension: Secondary | ICD-10-CM | POA: Diagnosis not present

## 2017-01-20 DIAGNOSIS — Z794 Long term (current) use of insulin: Secondary | ICD-10-CM | POA: Diagnosis not present

## 2017-01-20 DIAGNOSIS — Z885 Allergy status to narcotic agent status: Secondary | ICD-10-CM | POA: Diagnosis not present

## 2017-01-20 DIAGNOSIS — M545 Low back pain: Secondary | ICD-10-CM | POA: Diagnosis not present

## 2017-01-20 DIAGNOSIS — Z7982 Long term (current) use of aspirin: Secondary | ICD-10-CM | POA: Diagnosis not present

## 2017-01-20 DIAGNOSIS — E119 Type 2 diabetes mellitus without complications: Secondary | ICD-10-CM | POA: Diagnosis not present

## 2017-01-21 DIAGNOSIS — M545 Low back pain: Secondary | ICD-10-CM | POA: Diagnosis not present

## 2017-01-21 DIAGNOSIS — M4697 Unspecified inflammatory spondylopathy, lumbosacral region: Secondary | ICD-10-CM | POA: Diagnosis not present

## 2017-01-24 DIAGNOSIS — C61 Malignant neoplasm of prostate: Secondary | ICD-10-CM | POA: Diagnosis not present

## 2017-01-24 DIAGNOSIS — E113293 Type 2 diabetes mellitus with mild nonproliferative diabetic retinopathy without macular edema, bilateral: Secondary | ICD-10-CM | POA: Diagnosis not present

## 2017-01-24 DIAGNOSIS — R3129 Other microscopic hematuria: Secondary | ICD-10-CM | POA: Diagnosis not present

## 2017-01-24 DIAGNOSIS — H26493 Other secondary cataract, bilateral: Secondary | ICD-10-CM | POA: Diagnosis not present

## 2017-01-28 DIAGNOSIS — N401 Enlarged prostate with lower urinary tract symptoms: Secondary | ICD-10-CM | POA: Diagnosis not present

## 2017-01-28 DIAGNOSIS — E7800 Pure hypercholesterolemia, unspecified: Secondary | ICD-10-CM | POA: Diagnosis not present

## 2017-01-28 DIAGNOSIS — M5416 Radiculopathy, lumbar region: Secondary | ICD-10-CM | POA: Diagnosis not present

## 2017-01-28 DIAGNOSIS — K5909 Other constipation: Secondary | ICD-10-CM | POA: Diagnosis not present

## 2017-01-28 DIAGNOSIS — B20 Human immunodeficiency virus [HIV] disease: Secondary | ICD-10-CM | POA: Diagnosis not present

## 2017-01-28 DIAGNOSIS — F200 Paranoid schizophrenia: Secondary | ICD-10-CM | POA: Diagnosis not present

## 2017-01-28 DIAGNOSIS — D696 Thrombocytopenia, unspecified: Secondary | ICD-10-CM | POA: Diagnosis not present

## 2017-01-28 DIAGNOSIS — F341 Dysthymic disorder: Secondary | ICD-10-CM | POA: Diagnosis not present

## 2017-01-28 DIAGNOSIS — F19982 Other psychoactive substance use, unspecified with psychoactive substance-induced sleep disorder: Secondary | ICD-10-CM | POA: Diagnosis not present

## 2017-02-01 DIAGNOSIS — M4697 Unspecified inflammatory spondylopathy, lumbosacral region: Secondary | ICD-10-CM | POA: Diagnosis not present

## 2017-02-01 DIAGNOSIS — E119 Type 2 diabetes mellitus without complications: Secondary | ICD-10-CM | POA: Diagnosis not present

## 2017-02-01 DIAGNOSIS — Z885 Allergy status to narcotic agent status: Secondary | ICD-10-CM | POA: Diagnosis not present

## 2017-02-01 DIAGNOSIS — Z8546 Personal history of malignant neoplasm of prostate: Secondary | ICD-10-CM | POA: Diagnosis not present

## 2017-02-01 DIAGNOSIS — F329 Major depressive disorder, single episode, unspecified: Secondary | ICD-10-CM | POA: Diagnosis not present

## 2017-02-01 DIAGNOSIS — I10 Essential (primary) hypertension: Secondary | ICD-10-CM | POA: Diagnosis not present

## 2017-02-01 DIAGNOSIS — Z7982 Long term (current) use of aspirin: Secondary | ICD-10-CM | POA: Diagnosis not present

## 2017-02-01 DIAGNOSIS — Z21 Asymptomatic human immunodeficiency virus [HIV] infection status: Secondary | ICD-10-CM | POA: Diagnosis not present

## 2017-02-01 DIAGNOSIS — Z79899 Other long term (current) drug therapy: Secondary | ICD-10-CM | POA: Diagnosis not present

## 2017-02-01 DIAGNOSIS — M4687 Other specified inflammatory spondylopathies, lumbosacral region: Secondary | ICD-10-CM | POA: Diagnosis not present

## 2017-02-01 DIAGNOSIS — M545 Low back pain: Secondary | ICD-10-CM | POA: Diagnosis not present

## 2017-02-09 DIAGNOSIS — N401 Enlarged prostate with lower urinary tract symptoms: Secondary | ICD-10-CM | POA: Diagnosis not present

## 2017-02-09 DIAGNOSIS — F200 Paranoid schizophrenia: Secondary | ICD-10-CM | POA: Diagnosis not present

## 2017-02-09 DIAGNOSIS — M5416 Radiculopathy, lumbar region: Secondary | ICD-10-CM | POA: Diagnosis not present

## 2017-02-09 DIAGNOSIS — Z139 Encounter for screening, unspecified: Secondary | ICD-10-CM | POA: Diagnosis not present

## 2017-02-09 DIAGNOSIS — K5909 Other constipation: Secondary | ICD-10-CM | POA: Diagnosis not present

## 2017-02-09 DIAGNOSIS — B20 Human immunodeficiency virus [HIV] disease: Secondary | ICD-10-CM | POA: Diagnosis not present

## 2017-02-09 DIAGNOSIS — F19982 Other psychoactive substance use, unspecified with psychoactive substance-induced sleep disorder: Secondary | ICD-10-CM | POA: Diagnosis not present

## 2017-02-09 DIAGNOSIS — E7800 Pure hypercholesterolemia, unspecified: Secondary | ICD-10-CM | POA: Diagnosis not present

## 2017-02-09 DIAGNOSIS — E109 Type 1 diabetes mellitus without complications: Secondary | ICD-10-CM | POA: Diagnosis not present

## 2017-02-23 DIAGNOSIS — N3281 Overactive bladder: Secondary | ICD-10-CM | POA: Diagnosis not present

## 2017-02-23 DIAGNOSIS — R3129 Other microscopic hematuria: Secondary | ICD-10-CM | POA: Diagnosis not present

## 2017-02-23 DIAGNOSIS — C61 Malignant neoplasm of prostate: Secondary | ICD-10-CM | POA: Diagnosis not present

## 2017-02-25 DIAGNOSIS — F19982 Other psychoactive substance use, unspecified with psychoactive substance-induced sleep disorder: Secondary | ICD-10-CM | POA: Diagnosis not present

## 2017-02-25 DIAGNOSIS — R3129 Other microscopic hematuria: Secondary | ICD-10-CM | POA: Diagnosis not present

## 2017-02-25 DIAGNOSIS — K5909 Other constipation: Secondary | ICD-10-CM | POA: Diagnosis not present

## 2017-02-25 DIAGNOSIS — I700 Atherosclerosis of aorta: Secondary | ICD-10-CM | POA: Diagnosis not present

## 2017-02-25 DIAGNOSIS — F341 Dysthymic disorder: Secondary | ICD-10-CM | POA: Diagnosis not present

## 2017-02-25 DIAGNOSIS — M5416 Radiculopathy, lumbar region: Secondary | ICD-10-CM | POA: Diagnosis not present

## 2017-02-25 DIAGNOSIS — I864 Gastric varices: Secondary | ICD-10-CM | POA: Diagnosis not present

## 2017-02-25 DIAGNOSIS — K5730 Diverticulosis of large intestine without perforation or abscess without bleeding: Secondary | ICD-10-CM | POA: Diagnosis not present

## 2017-02-25 DIAGNOSIS — E109 Type 1 diabetes mellitus without complications: Secondary | ICD-10-CM | POA: Diagnosis not present

## 2017-02-25 DIAGNOSIS — K7460 Unspecified cirrhosis of liver: Secondary | ICD-10-CM | POA: Diagnosis not present

## 2017-02-25 DIAGNOSIS — E7800 Pure hypercholesterolemia, unspecified: Secondary | ICD-10-CM | POA: Diagnosis not present

## 2017-02-25 DIAGNOSIS — N401 Enlarged prostate with lower urinary tract symptoms: Secondary | ICD-10-CM | POA: Diagnosis not present

## 2017-02-25 DIAGNOSIS — E1159 Type 2 diabetes mellitus with other circulatory complications: Secondary | ICD-10-CM | POA: Diagnosis not present

## 2017-02-25 DIAGNOSIS — D696 Thrombocytopenia, unspecified: Secondary | ICD-10-CM | POA: Diagnosis not present

## 2017-02-28 DIAGNOSIS — B20 Human immunodeficiency virus [HIV] disease: Secondary | ICD-10-CM | POA: Diagnosis not present

## 2017-02-28 DIAGNOSIS — Z79899 Other long term (current) drug therapy: Secondary | ICD-10-CM | POA: Diagnosis not present

## 2017-02-28 DIAGNOSIS — E119 Type 2 diabetes mellitus without complications: Secondary | ICD-10-CM | POA: Diagnosis not present

## 2017-02-28 DIAGNOSIS — Z794 Long term (current) use of insulin: Secondary | ICD-10-CM | POA: Diagnosis not present

## 2017-03-10 DIAGNOSIS — E119 Type 2 diabetes mellitus without complications: Secondary | ICD-10-CM | POA: Diagnosis not present

## 2017-03-10 DIAGNOSIS — B20 Human immunodeficiency virus [HIV] disease: Secondary | ICD-10-CM | POA: Diagnosis not present

## 2017-03-10 DIAGNOSIS — Z79899 Other long term (current) drug therapy: Secondary | ICD-10-CM | POA: Diagnosis not present

## 2017-03-10 DIAGNOSIS — Z794 Long term (current) use of insulin: Secondary | ICD-10-CM | POA: Diagnosis not present

## 2017-03-21 DIAGNOSIS — B20 Human immunodeficiency virus [HIV] disease: Secondary | ICD-10-CM | POA: Diagnosis not present

## 2017-03-25 DIAGNOSIS — E109 Type 1 diabetes mellitus without complications: Secondary | ICD-10-CM | POA: Diagnosis not present

## 2017-03-25 DIAGNOSIS — N401 Enlarged prostate with lower urinary tract symptoms: Secondary | ICD-10-CM | POA: Diagnosis not present

## 2017-03-25 DIAGNOSIS — E7800 Pure hypercholesterolemia, unspecified: Secondary | ICD-10-CM | POA: Diagnosis not present

## 2017-03-25 DIAGNOSIS — E1159 Type 2 diabetes mellitus with other circulatory complications: Secondary | ICD-10-CM | POA: Diagnosis not present

## 2017-03-25 DIAGNOSIS — F341 Dysthymic disorder: Secondary | ICD-10-CM | POA: Diagnosis not present

## 2017-03-25 DIAGNOSIS — D696 Thrombocytopenia, unspecified: Secondary | ICD-10-CM | POA: Diagnosis not present

## 2017-03-25 DIAGNOSIS — K5909 Other constipation: Secondary | ICD-10-CM | POA: Diagnosis not present

## 2017-03-25 DIAGNOSIS — M5416 Radiculopathy, lumbar region: Secondary | ICD-10-CM | POA: Diagnosis not present

## 2017-03-25 DIAGNOSIS — F19980 Other psychoactive substance use, unspecified with psychoactive substance-induced anxiety disorder: Secondary | ICD-10-CM | POA: Diagnosis not present

## 2017-03-29 DIAGNOSIS — M4807 Spinal stenosis, lumbosacral region: Secondary | ICD-10-CM | POA: Diagnosis not present

## 2017-04-22 DIAGNOSIS — K5909 Other constipation: Secondary | ICD-10-CM | POA: Diagnosis not present

## 2017-04-22 DIAGNOSIS — E1159 Type 2 diabetes mellitus with other circulatory complications: Secondary | ICD-10-CM | POA: Diagnosis not present

## 2017-04-22 DIAGNOSIS — F341 Dysthymic disorder: Secondary | ICD-10-CM | POA: Diagnosis not present

## 2017-04-22 DIAGNOSIS — E109 Type 1 diabetes mellitus without complications: Secondary | ICD-10-CM | POA: Diagnosis not present

## 2017-04-22 DIAGNOSIS — F19982 Other psychoactive substance use, unspecified with psychoactive substance-induced sleep disorder: Secondary | ICD-10-CM | POA: Diagnosis not present

## 2017-04-22 DIAGNOSIS — M5416 Radiculopathy, lumbar region: Secondary | ICD-10-CM | POA: Diagnosis not present

## 2017-04-22 DIAGNOSIS — E7800 Pure hypercholesterolemia, unspecified: Secondary | ICD-10-CM | POA: Diagnosis not present

## 2017-04-22 DIAGNOSIS — D696 Thrombocytopenia, unspecified: Secondary | ICD-10-CM | POA: Diagnosis not present

## 2017-04-22 DIAGNOSIS — N401 Enlarged prostate with lower urinary tract symptoms: Secondary | ICD-10-CM | POA: Diagnosis not present

## 2017-04-26 DIAGNOSIS — M5136 Other intervertebral disc degeneration, lumbar region: Secondary | ICD-10-CM | POA: Diagnosis not present

## 2017-04-26 DIAGNOSIS — Z1389 Encounter for screening for other disorder: Secondary | ICD-10-CM | POA: Diagnosis not present

## 2017-04-26 DIAGNOSIS — Z8546 Personal history of malignant neoplasm of prostate: Secondary | ICD-10-CM | POA: Diagnosis not present

## 2017-04-26 DIAGNOSIS — G894 Chronic pain syndrome: Secondary | ICD-10-CM | POA: Diagnosis not present

## 2017-04-26 DIAGNOSIS — M545 Low back pain: Secondary | ICD-10-CM | POA: Diagnosis not present

## 2017-04-26 DIAGNOSIS — Z21 Asymptomatic human immunodeficiency virus [HIV] infection status: Secondary | ICD-10-CM | POA: Diagnosis not present

## 2017-04-26 DIAGNOSIS — M47816 Spondylosis without myelopathy or radiculopathy, lumbar region: Secondary | ICD-10-CM | POA: Diagnosis not present

## 2017-04-26 DIAGNOSIS — M25561 Pain in right knee: Secondary | ICD-10-CM | POA: Diagnosis not present

## 2017-05-25 DIAGNOSIS — M47816 Spondylosis without myelopathy or radiculopathy, lumbar region: Secondary | ICD-10-CM | POA: Diagnosis not present

## 2017-05-25 DIAGNOSIS — Z1389 Encounter for screening for other disorder: Secondary | ICD-10-CM | POA: Diagnosis not present

## 2017-05-25 DIAGNOSIS — Z21 Asymptomatic human immunodeficiency virus [HIV] infection status: Secondary | ICD-10-CM | POA: Diagnosis not present

## 2017-05-25 DIAGNOSIS — M545 Low back pain: Secondary | ICD-10-CM | POA: Diagnosis not present

## 2017-05-25 DIAGNOSIS — Z8546 Personal history of malignant neoplasm of prostate: Secondary | ICD-10-CM | POA: Diagnosis not present

## 2017-05-25 DIAGNOSIS — G894 Chronic pain syndrome: Secondary | ICD-10-CM | POA: Diagnosis not present

## 2017-05-25 DIAGNOSIS — M25561 Pain in right knee: Secondary | ICD-10-CM | POA: Diagnosis not present

## 2017-05-25 DIAGNOSIS — M5136 Other intervertebral disc degeneration, lumbar region: Secondary | ICD-10-CM | POA: Diagnosis not present

## 2017-06-17 DIAGNOSIS — Z79899 Other long term (current) drug therapy: Secondary | ICD-10-CM | POA: Diagnosis not present

## 2017-06-17 DIAGNOSIS — Z23 Encounter for immunization: Secondary | ICD-10-CM | POA: Diagnosis not present

## 2017-06-17 DIAGNOSIS — B20 Human immunodeficiency virus [HIV] disease: Secondary | ICD-10-CM | POA: Diagnosis not present

## 2017-06-23 DIAGNOSIS — M25561 Pain in right knee: Secondary | ICD-10-CM | POA: Diagnosis not present

## 2017-06-23 DIAGNOSIS — Z1389 Encounter for screening for other disorder: Secondary | ICD-10-CM | POA: Diagnosis not present

## 2017-06-23 DIAGNOSIS — M545 Low back pain: Secondary | ICD-10-CM | POA: Diagnosis not present

## 2017-06-23 DIAGNOSIS — M5136 Other intervertebral disc degeneration, lumbar region: Secondary | ICD-10-CM | POA: Diagnosis not present

## 2017-06-23 DIAGNOSIS — M47816 Spondylosis without myelopathy or radiculopathy, lumbar region: Secondary | ICD-10-CM | POA: Diagnosis not present

## 2017-06-23 DIAGNOSIS — Z8546 Personal history of malignant neoplasm of prostate: Secondary | ICD-10-CM | POA: Diagnosis not present

## 2017-06-23 DIAGNOSIS — G894 Chronic pain syndrome: Secondary | ICD-10-CM | POA: Diagnosis not present

## 2017-06-23 DIAGNOSIS — Z21 Asymptomatic human immunodeficiency virus [HIV] infection status: Secondary | ICD-10-CM | POA: Diagnosis not present

## 2017-07-08 DIAGNOSIS — F19982 Other psychoactive substance use, unspecified with psychoactive substance-induced sleep disorder: Secondary | ICD-10-CM | POA: Diagnosis not present

## 2017-07-08 DIAGNOSIS — E1159 Type 2 diabetes mellitus with other circulatory complications: Secondary | ICD-10-CM | POA: Diagnosis not present

## 2017-07-08 DIAGNOSIS — K5909 Other constipation: Secondary | ICD-10-CM | POA: Diagnosis not present

## 2017-07-08 DIAGNOSIS — F341 Dysthymic disorder: Secondary | ICD-10-CM | POA: Diagnosis not present

## 2017-07-08 DIAGNOSIS — D696 Thrombocytopenia, unspecified: Secondary | ICD-10-CM | POA: Diagnosis not present

## 2017-07-08 DIAGNOSIS — E7800 Pure hypercholesterolemia, unspecified: Secondary | ICD-10-CM | POA: Diagnosis not present

## 2017-07-08 DIAGNOSIS — N401 Enlarged prostate with lower urinary tract symptoms: Secondary | ICD-10-CM | POA: Diagnosis not present

## 2017-07-08 DIAGNOSIS — M5416 Radiculopathy, lumbar region: Secondary | ICD-10-CM | POA: Diagnosis not present

## 2017-07-08 DIAGNOSIS — E109 Type 1 diabetes mellitus without complications: Secondary | ICD-10-CM | POA: Diagnosis not present

## 2017-07-12 DIAGNOSIS — C61 Malignant neoplasm of prostate: Secondary | ICD-10-CM | POA: Diagnosis not present

## 2017-07-12 DIAGNOSIS — N3281 Overactive bladder: Secondary | ICD-10-CM | POA: Diagnosis not present

## 2017-07-26 DIAGNOSIS — E113293 Type 2 diabetes mellitus with mild nonproliferative diabetic retinopathy without macular edema, bilateral: Secondary | ICD-10-CM | POA: Diagnosis not present

## 2017-07-26 DIAGNOSIS — H26493 Other secondary cataract, bilateral: Secondary | ICD-10-CM | POA: Diagnosis not present

## 2017-07-27 DIAGNOSIS — Z21 Asymptomatic human immunodeficiency virus [HIV] infection status: Secondary | ICD-10-CM | POA: Diagnosis not present

## 2017-07-27 DIAGNOSIS — M47816 Spondylosis without myelopathy or radiculopathy, lumbar region: Secondary | ICD-10-CM | POA: Diagnosis not present

## 2017-07-27 DIAGNOSIS — M5136 Other intervertebral disc degeneration, lumbar region: Secondary | ICD-10-CM | POA: Diagnosis not present

## 2017-07-27 DIAGNOSIS — Z8546 Personal history of malignant neoplasm of prostate: Secondary | ICD-10-CM | POA: Diagnosis not present

## 2017-07-27 DIAGNOSIS — Z1331 Encounter for screening for depression: Secondary | ICD-10-CM | POA: Diagnosis not present

## 2017-07-27 DIAGNOSIS — G894 Chronic pain syndrome: Secondary | ICD-10-CM | POA: Diagnosis not present

## 2017-07-27 DIAGNOSIS — M25561 Pain in right knee: Secondary | ICD-10-CM | POA: Diagnosis not present

## 2017-07-27 DIAGNOSIS — M545 Low back pain: Secondary | ICD-10-CM | POA: Diagnosis not present

## 2017-08-09 DIAGNOSIS — M25561 Pain in right knee: Secondary | ICD-10-CM | POA: Diagnosis not present

## 2017-08-09 DIAGNOSIS — Z21 Asymptomatic human immunodeficiency virus [HIV] infection status: Secondary | ICD-10-CM | POA: Diagnosis not present

## 2017-08-09 DIAGNOSIS — M47816 Spondylosis without myelopathy or radiculopathy, lumbar region: Secondary | ICD-10-CM | POA: Diagnosis not present

## 2017-08-09 DIAGNOSIS — G894 Chronic pain syndrome: Secondary | ICD-10-CM | POA: Diagnosis not present

## 2017-08-09 DIAGNOSIS — M5136 Other intervertebral disc degeneration, lumbar region: Secondary | ICD-10-CM | POA: Diagnosis not present

## 2017-08-09 DIAGNOSIS — Z8546 Personal history of malignant neoplasm of prostate: Secondary | ICD-10-CM | POA: Diagnosis not present

## 2017-08-09 DIAGNOSIS — M545 Low back pain: Secondary | ICD-10-CM | POA: Diagnosis not present

## 2017-08-30 DIAGNOSIS — E7800 Pure hypercholesterolemia, unspecified: Secondary | ICD-10-CM | POA: Diagnosis not present

## 2017-08-30 DIAGNOSIS — N401 Enlarged prostate with lower urinary tract symptoms: Secondary | ICD-10-CM | POA: Diagnosis not present

## 2017-08-30 DIAGNOSIS — B20 Human immunodeficiency virus [HIV] disease: Secondary | ICD-10-CM | POA: Diagnosis not present

## 2017-08-30 DIAGNOSIS — I10 Essential (primary) hypertension: Secondary | ICD-10-CM | POA: Diagnosis not present

## 2017-08-30 DIAGNOSIS — F329 Major depressive disorder, single episode, unspecified: Secondary | ICD-10-CM | POA: Diagnosis not present

## 2017-08-30 DIAGNOSIS — F19982 Other psychoactive substance use, unspecified with psychoactive substance-induced sleep disorder: Secondary | ICD-10-CM | POA: Diagnosis not present

## 2017-08-30 DIAGNOSIS — M5416 Radiculopathy, lumbar region: Secondary | ICD-10-CM | POA: Diagnosis not present

## 2017-08-30 DIAGNOSIS — E119 Type 2 diabetes mellitus without complications: Secondary | ICD-10-CM | POA: Diagnosis not present

## 2017-08-30 DIAGNOSIS — F341 Dysthymic disorder: Secondary | ICD-10-CM | POA: Diagnosis not present

## 2017-08-30 DIAGNOSIS — M545 Low back pain: Secondary | ICD-10-CM | POA: Diagnosis not present

## 2017-08-30 DIAGNOSIS — F200 Paranoid schizophrenia: Secondary | ICD-10-CM | POA: Diagnosis not present

## 2017-08-30 DIAGNOSIS — K5909 Other constipation: Secondary | ICD-10-CM | POA: Diagnosis not present

## 2017-08-30 DIAGNOSIS — D696 Thrombocytopenia, unspecified: Secondary | ICD-10-CM | POA: Diagnosis not present

## 2017-08-30 DIAGNOSIS — Z79899 Other long term (current) drug therapy: Secondary | ICD-10-CM | POA: Diagnosis not present

## 2017-08-30 DIAGNOSIS — E1159 Type 2 diabetes mellitus with other circulatory complications: Secondary | ICD-10-CM | POA: Diagnosis not present

## 2017-08-30 DIAGNOSIS — E782 Mixed hyperlipidemia: Secondary | ICD-10-CM | POA: Diagnosis not present

## 2017-08-30 DIAGNOSIS — G8929 Other chronic pain: Secondary | ICD-10-CM | POA: Diagnosis not present

## 2017-08-30 DIAGNOSIS — E109 Type 1 diabetes mellitus without complications: Secondary | ICD-10-CM | POA: Diagnosis not present

## 2017-09-08 DIAGNOSIS — Z79899 Other long term (current) drug therapy: Secondary | ICD-10-CM | POA: Diagnosis not present

## 2017-09-08 DIAGNOSIS — M545 Low back pain: Secondary | ICD-10-CM | POA: Diagnosis not present

## 2017-09-08 DIAGNOSIS — F329 Major depressive disorder, single episode, unspecified: Secondary | ICD-10-CM | POA: Diagnosis not present

## 2017-09-08 DIAGNOSIS — G8929 Other chronic pain: Secondary | ICD-10-CM | POA: Diagnosis not present

## 2017-09-08 DIAGNOSIS — E782 Mixed hyperlipidemia: Secondary | ICD-10-CM | POA: Diagnosis not present

## 2017-09-08 DIAGNOSIS — I10 Essential (primary) hypertension: Secondary | ICD-10-CM | POA: Diagnosis not present

## 2017-09-08 DIAGNOSIS — E119 Type 2 diabetes mellitus without complications: Secondary | ICD-10-CM | POA: Diagnosis not present

## 2017-09-08 DIAGNOSIS — F200 Paranoid schizophrenia: Secondary | ICD-10-CM | POA: Diagnosis not present

## 2017-09-08 DIAGNOSIS — B20 Human immunodeficiency virus [HIV] disease: Secondary | ICD-10-CM | POA: Diagnosis not present

## 2017-10-12 DIAGNOSIS — D696 Thrombocytopenia, unspecified: Secondary | ICD-10-CM | POA: Diagnosis not present

## 2017-10-12 DIAGNOSIS — M5416 Radiculopathy, lumbar region: Secondary | ICD-10-CM | POA: Diagnosis not present

## 2017-10-12 DIAGNOSIS — F341 Dysthymic disorder: Secondary | ICD-10-CM | POA: Diagnosis not present

## 2017-10-12 DIAGNOSIS — E109 Type 1 diabetes mellitus without complications: Secondary | ICD-10-CM | POA: Diagnosis not present

## 2017-10-12 DIAGNOSIS — E7800 Pure hypercholesterolemia, unspecified: Secondary | ICD-10-CM | POA: Diagnosis not present

## 2017-10-12 DIAGNOSIS — E1159 Type 2 diabetes mellitus with other circulatory complications: Secondary | ICD-10-CM | POA: Diagnosis not present

## 2017-10-12 DIAGNOSIS — F19980 Other psychoactive substance use, unspecified with psychoactive substance-induced anxiety disorder: Secondary | ICD-10-CM | POA: Diagnosis not present

## 2017-10-12 DIAGNOSIS — B20 Human immunodeficiency virus [HIV] disease: Secondary | ICD-10-CM | POA: Diagnosis not present

## 2017-10-12 DIAGNOSIS — F200 Paranoid schizophrenia: Secondary | ICD-10-CM | POA: Diagnosis not present

## 2017-11-09 DIAGNOSIS — D696 Thrombocytopenia, unspecified: Secondary | ICD-10-CM | POA: Diagnosis not present

## 2017-11-09 DIAGNOSIS — E109 Type 1 diabetes mellitus without complications: Secondary | ICD-10-CM | POA: Diagnosis not present

## 2017-11-09 DIAGNOSIS — F200 Paranoid schizophrenia: Secondary | ICD-10-CM | POA: Diagnosis not present

## 2017-11-09 DIAGNOSIS — E7800 Pure hypercholesterolemia, unspecified: Secondary | ICD-10-CM | POA: Diagnosis not present

## 2017-11-09 DIAGNOSIS — B20 Human immunodeficiency virus [HIV] disease: Secondary | ICD-10-CM | POA: Diagnosis not present

## 2017-11-09 DIAGNOSIS — E119 Type 2 diabetes mellitus without complications: Secondary | ICD-10-CM | POA: Diagnosis not present

## 2017-11-09 DIAGNOSIS — N189 Chronic kidney disease, unspecified: Secondary | ICD-10-CM | POA: Diagnosis not present

## 2017-11-09 DIAGNOSIS — M5416 Radiculopathy, lumbar region: Secondary | ICD-10-CM | POA: Diagnosis not present

## 2017-11-09 DIAGNOSIS — F19982 Other psychoactive substance use, unspecified with psychoactive substance-induced sleep disorder: Secondary | ICD-10-CM | POA: Diagnosis not present

## 2017-11-11 DIAGNOSIS — Z794 Long term (current) use of insulin: Secondary | ICD-10-CM | POA: Diagnosis not present

## 2017-11-11 DIAGNOSIS — E7800 Pure hypercholesterolemia, unspecified: Secondary | ICD-10-CM | POA: Diagnosis not present

## 2017-11-11 DIAGNOSIS — E119 Type 2 diabetes mellitus without complications: Secondary | ICD-10-CM | POA: Diagnosis not present

## 2017-11-11 DIAGNOSIS — I10 Essential (primary) hypertension: Secondary | ICD-10-CM | POA: Diagnosis not present

## 2017-11-11 DIAGNOSIS — Z79899 Other long term (current) drug therapy: Secondary | ICD-10-CM | POA: Diagnosis not present

## 2017-11-11 DIAGNOSIS — N289 Disorder of kidney and ureter, unspecified: Secondary | ICD-10-CM | POA: Diagnosis not present

## 2017-11-11 DIAGNOSIS — F329 Major depressive disorder, single episode, unspecified: Secondary | ICD-10-CM | POA: Diagnosis not present

## 2017-11-11 DIAGNOSIS — R809 Proteinuria, unspecified: Secondary | ICD-10-CM | POA: Diagnosis not present

## 2017-11-11 DIAGNOSIS — B20 Human immunodeficiency virus [HIV] disease: Secondary | ICD-10-CM | POA: Diagnosis not present

## 2017-12-07 DIAGNOSIS — M5416 Radiculopathy, lumbar region: Secondary | ICD-10-CM | POA: Diagnosis not present

## 2017-12-07 DIAGNOSIS — F19982 Other psychoactive substance use, unspecified with psychoactive substance-induced sleep disorder: Secondary | ICD-10-CM | POA: Diagnosis not present

## 2017-12-07 DIAGNOSIS — E7800 Pure hypercholesterolemia, unspecified: Secondary | ICD-10-CM | POA: Diagnosis not present

## 2017-12-07 DIAGNOSIS — E1159 Type 2 diabetes mellitus with other circulatory complications: Secondary | ICD-10-CM | POA: Diagnosis not present

## 2017-12-07 DIAGNOSIS — F341 Dysthymic disorder: Secondary | ICD-10-CM | POA: Diagnosis not present

## 2017-12-07 DIAGNOSIS — Z794 Long term (current) use of insulin: Secondary | ICD-10-CM | POA: Diagnosis not present

## 2017-12-07 DIAGNOSIS — F200 Paranoid schizophrenia: Secondary | ICD-10-CM | POA: Diagnosis not present

## 2017-12-07 DIAGNOSIS — B20 Human immunodeficiency virus [HIV] disease: Secondary | ICD-10-CM | POA: Diagnosis not present

## 2017-12-07 DIAGNOSIS — E109 Type 1 diabetes mellitus without complications: Secondary | ICD-10-CM | POA: Diagnosis not present

## 2017-12-07 DIAGNOSIS — E1169 Type 2 diabetes mellitus with other specified complication: Secondary | ICD-10-CM | POA: Diagnosis not present

## 2017-12-07 DIAGNOSIS — D696 Thrombocytopenia, unspecified: Secondary | ICD-10-CM | POA: Diagnosis not present

## 2017-12-21 DIAGNOSIS — B20 Human immunodeficiency virus [HIV] disease: Secondary | ICD-10-CM | POA: Diagnosis not present

## 2017-12-21 DIAGNOSIS — F19982 Other psychoactive substance use, unspecified with psychoactive substance-induced sleep disorder: Secondary | ICD-10-CM | POA: Diagnosis not present

## 2017-12-21 DIAGNOSIS — D696 Thrombocytopenia, unspecified: Secondary | ICD-10-CM | POA: Diagnosis not present

## 2017-12-21 DIAGNOSIS — F341 Dysthymic disorder: Secondary | ICD-10-CM | POA: Diagnosis not present

## 2017-12-21 DIAGNOSIS — E1159 Type 2 diabetes mellitus with other circulatory complications: Secondary | ICD-10-CM | POA: Diagnosis not present

## 2017-12-21 DIAGNOSIS — E109 Type 1 diabetes mellitus without complications: Secondary | ICD-10-CM | POA: Diagnosis not present

## 2017-12-21 DIAGNOSIS — F200 Paranoid schizophrenia: Secondary | ICD-10-CM | POA: Diagnosis not present

## 2017-12-21 DIAGNOSIS — M5416 Radiculopathy, lumbar region: Secondary | ICD-10-CM | POA: Diagnosis not present

## 2017-12-21 DIAGNOSIS — E7800 Pure hypercholesterolemia, unspecified: Secondary | ICD-10-CM | POA: Diagnosis not present

## 2017-12-28 DIAGNOSIS — F19982 Other psychoactive substance use, unspecified with psychoactive substance-induced sleep disorder: Secondary | ICD-10-CM | POA: Diagnosis not present

## 2017-12-28 DIAGNOSIS — D696 Thrombocytopenia, unspecified: Secondary | ICD-10-CM | POA: Diagnosis not present

## 2017-12-28 DIAGNOSIS — E7800 Pure hypercholesterolemia, unspecified: Secondary | ICD-10-CM | POA: Diagnosis not present

## 2017-12-28 DIAGNOSIS — F329 Major depressive disorder, single episode, unspecified: Secondary | ICD-10-CM | POA: Diagnosis not present

## 2017-12-28 DIAGNOSIS — N401 Enlarged prostate with lower urinary tract symptoms: Secondary | ICD-10-CM | POA: Diagnosis not present

## 2017-12-28 DIAGNOSIS — M5416 Radiculopathy, lumbar region: Secondary | ICD-10-CM | POA: Diagnosis not present

## 2017-12-28 DIAGNOSIS — E109 Type 1 diabetes mellitus without complications: Secondary | ICD-10-CM | POA: Diagnosis not present

## 2017-12-28 DIAGNOSIS — F200 Paranoid schizophrenia: Secondary | ICD-10-CM | POA: Diagnosis not present

## 2017-12-28 DIAGNOSIS — B20 Human immunodeficiency virus [HIV] disease: Secondary | ICD-10-CM | POA: Diagnosis not present

## 2018-01-05 DIAGNOSIS — E7800 Pure hypercholesterolemia, unspecified: Secondary | ICD-10-CM | POA: Diagnosis not present

## 2018-01-05 DIAGNOSIS — M5416 Radiculopathy, lumbar region: Secondary | ICD-10-CM | POA: Diagnosis not present

## 2018-01-05 DIAGNOSIS — Z1331 Encounter for screening for depression: Secondary | ICD-10-CM | POA: Diagnosis not present

## 2018-01-05 DIAGNOSIS — F19982 Other psychoactive substance use, unspecified with psychoactive substance-induced sleep disorder: Secondary | ICD-10-CM | POA: Diagnosis not present

## 2018-01-05 DIAGNOSIS — D696 Thrombocytopenia, unspecified: Secondary | ICD-10-CM | POA: Diagnosis not present

## 2018-01-05 DIAGNOSIS — Z9181 History of falling: Secondary | ICD-10-CM | POA: Diagnosis not present

## 2018-01-05 DIAGNOSIS — B20 Human immunodeficiency virus [HIV] disease: Secondary | ICD-10-CM | POA: Diagnosis not present

## 2018-01-05 DIAGNOSIS — F200 Paranoid schizophrenia: Secondary | ICD-10-CM | POA: Diagnosis not present

## 2018-01-05 DIAGNOSIS — E109 Type 1 diabetes mellitus without complications: Secondary | ICD-10-CM | POA: Diagnosis not present

## 2018-01-10 DIAGNOSIS — N3281 Overactive bladder: Secondary | ICD-10-CM | POA: Diagnosis not present

## 2018-01-10 DIAGNOSIS — R3129 Other microscopic hematuria: Secondary | ICD-10-CM | POA: Diagnosis not present

## 2018-01-10 DIAGNOSIS — C61 Malignant neoplasm of prostate: Secondary | ICD-10-CM | POA: Diagnosis not present

## 2018-01-23 DIAGNOSIS — H26493 Other secondary cataract, bilateral: Secondary | ICD-10-CM | POA: Diagnosis not present

## 2018-01-23 DIAGNOSIS — E113293 Type 2 diabetes mellitus with mild nonproliferative diabetic retinopathy without macular edema, bilateral: Secondary | ICD-10-CM | POA: Diagnosis not present

## 2018-02-03 DIAGNOSIS — E109 Type 1 diabetes mellitus without complications: Secondary | ICD-10-CM | POA: Diagnosis not present

## 2018-02-03 DIAGNOSIS — E1159 Type 2 diabetes mellitus with other circulatory complications: Secondary | ICD-10-CM | POA: Diagnosis not present

## 2018-02-03 DIAGNOSIS — M5416 Radiculopathy, lumbar region: Secondary | ICD-10-CM | POA: Diagnosis not present

## 2018-02-03 DIAGNOSIS — D696 Thrombocytopenia, unspecified: Secondary | ICD-10-CM | POA: Diagnosis not present

## 2018-02-03 DIAGNOSIS — F200 Paranoid schizophrenia: Secondary | ICD-10-CM | POA: Diagnosis not present

## 2018-02-03 DIAGNOSIS — B20 Human immunodeficiency virus [HIV] disease: Secondary | ICD-10-CM | POA: Diagnosis not present

## 2018-02-03 DIAGNOSIS — E7800 Pure hypercholesterolemia, unspecified: Secondary | ICD-10-CM | POA: Diagnosis not present

## 2018-02-03 DIAGNOSIS — F19982 Other psychoactive substance use, unspecified with psychoactive substance-induced sleep disorder: Secondary | ICD-10-CM | POA: Diagnosis not present

## 2018-02-03 DIAGNOSIS — N189 Chronic kidney disease, unspecified: Secondary | ICD-10-CM | POA: Diagnosis not present

## 2018-02-15 DIAGNOSIS — B20 Human immunodeficiency virus [HIV] disease: Secondary | ICD-10-CM | POA: Diagnosis not present

## 2018-02-15 DIAGNOSIS — F329 Major depressive disorder, single episode, unspecified: Secondary | ICD-10-CM | POA: Diagnosis not present

## 2018-02-15 DIAGNOSIS — N401 Enlarged prostate with lower urinary tract symptoms: Secondary | ICD-10-CM | POA: Diagnosis not present

## 2018-02-15 DIAGNOSIS — F19982 Other psychoactive substance use, unspecified with psychoactive substance-induced sleep disorder: Secondary | ICD-10-CM | POA: Diagnosis not present

## 2018-02-15 DIAGNOSIS — F200 Paranoid schizophrenia: Secondary | ICD-10-CM | POA: Diagnosis not present

## 2018-02-15 DIAGNOSIS — N189 Chronic kidney disease, unspecified: Secondary | ICD-10-CM | POA: Diagnosis not present

## 2018-02-15 DIAGNOSIS — E109 Type 1 diabetes mellitus without complications: Secondary | ICD-10-CM | POA: Diagnosis not present

## 2018-02-15 DIAGNOSIS — D696 Thrombocytopenia, unspecified: Secondary | ICD-10-CM | POA: Diagnosis not present

## 2018-02-15 DIAGNOSIS — K5909 Other constipation: Secondary | ICD-10-CM | POA: Diagnosis not present

## 2018-03-10 DIAGNOSIS — Z9181 History of falling: Secondary | ICD-10-CM | POA: Diagnosis not present

## 2018-03-10 DIAGNOSIS — B20 Human immunodeficiency virus [HIV] disease: Secondary | ICD-10-CM | POA: Diagnosis not present

## 2018-03-10 DIAGNOSIS — D696 Thrombocytopenia, unspecified: Secondary | ICD-10-CM | POA: Diagnosis not present

## 2018-03-10 DIAGNOSIS — Z6836 Body mass index (BMI) 36.0-36.9, adult: Secondary | ICD-10-CM | POA: Diagnosis not present

## 2018-03-10 DIAGNOSIS — Z0000 Encounter for general adult medical examination without abnormal findings: Secondary | ICD-10-CM | POA: Diagnosis not present

## 2018-03-10 DIAGNOSIS — Z1339 Encounter for screening examination for other mental health and behavioral disorders: Secondary | ICD-10-CM | POA: Diagnosis not present

## 2018-03-10 DIAGNOSIS — E785 Hyperlipidemia, unspecified: Secondary | ICD-10-CM | POA: Diagnosis not present

## 2018-03-10 DIAGNOSIS — F200 Paranoid schizophrenia: Secondary | ICD-10-CM | POA: Diagnosis not present

## 2018-03-10 DIAGNOSIS — F329 Major depressive disorder, single episode, unspecified: Secondary | ICD-10-CM | POA: Diagnosis not present

## 2018-03-13 DIAGNOSIS — N390 Urinary tract infection, site not specified: Secondary | ICD-10-CM | POA: Diagnosis not present

## 2018-03-13 DIAGNOSIS — C61 Malignant neoplasm of prostate: Secondary | ICD-10-CM | POA: Diagnosis not present

## 2018-03-13 DIAGNOSIS — N3281 Overactive bladder: Secondary | ICD-10-CM | POA: Diagnosis not present

## 2018-03-13 DIAGNOSIS — R3129 Other microscopic hematuria: Secondary | ICD-10-CM | POA: Diagnosis not present

## 2018-04-13 DIAGNOSIS — N390 Urinary tract infection, site not specified: Secondary | ICD-10-CM | POA: Diagnosis not present

## 2018-04-18 DIAGNOSIS — B20 Human immunodeficiency virus [HIV] disease: Secondary | ICD-10-CM | POA: Diagnosis not present

## 2018-04-21 DIAGNOSIS — N3281 Overactive bladder: Secondary | ICD-10-CM | POA: Diagnosis not present

## 2018-04-21 DIAGNOSIS — C61 Malignant neoplasm of prostate: Secondary | ICD-10-CM | POA: Diagnosis not present

## 2018-04-21 DIAGNOSIS — R3129 Other microscopic hematuria: Secondary | ICD-10-CM | POA: Diagnosis not present

## 2018-04-27 DIAGNOSIS — E119 Type 2 diabetes mellitus without complications: Secondary | ICD-10-CM | POA: Diagnosis not present

## 2018-04-27 DIAGNOSIS — E1122 Type 2 diabetes mellitus with diabetic chronic kidney disease: Secondary | ICD-10-CM | POA: Diagnosis not present

## 2018-04-27 DIAGNOSIS — I129 Hypertensive chronic kidney disease with stage 1 through stage 4 chronic kidney disease, or unspecified chronic kidney disease: Secondary | ICD-10-CM | POA: Diagnosis not present

## 2018-04-27 DIAGNOSIS — B20 Human immunodeficiency virus [HIV] disease: Secondary | ICD-10-CM | POA: Diagnosis not present

## 2018-04-27 DIAGNOSIS — F329 Major depressive disorder, single episode, unspecified: Secondary | ICD-10-CM | POA: Diagnosis not present

## 2018-04-27 DIAGNOSIS — N400 Enlarged prostate without lower urinary tract symptoms: Secondary | ICD-10-CM | POA: Diagnosis not present

## 2018-04-27 DIAGNOSIS — G4700 Insomnia, unspecified: Secondary | ICD-10-CM | POA: Diagnosis not present

## 2018-04-27 DIAGNOSIS — Z79899 Other long term (current) drug therapy: Secondary | ICD-10-CM | POA: Diagnosis not present

## 2018-04-27 DIAGNOSIS — E7800 Pure hypercholesterolemia, unspecified: Secondary | ICD-10-CM | POA: Diagnosis not present

## 2018-05-24 DIAGNOSIS — K5909 Other constipation: Secondary | ICD-10-CM | POA: Diagnosis not present

## 2018-05-24 DIAGNOSIS — B20 Human immunodeficiency virus [HIV] disease: Secondary | ICD-10-CM | POA: Diagnosis not present

## 2018-05-24 DIAGNOSIS — F329 Major depressive disorder, single episode, unspecified: Secondary | ICD-10-CM | POA: Diagnosis not present

## 2018-05-24 DIAGNOSIS — N401 Enlarged prostate with lower urinary tract symptoms: Secondary | ICD-10-CM | POA: Diagnosis not present

## 2018-05-24 DIAGNOSIS — D696 Thrombocytopenia, unspecified: Secondary | ICD-10-CM | POA: Diagnosis not present

## 2018-05-24 DIAGNOSIS — F19982 Other psychoactive substance use, unspecified with psychoactive substance-induced sleep disorder: Secondary | ICD-10-CM | POA: Diagnosis not present

## 2018-05-24 DIAGNOSIS — F200 Paranoid schizophrenia: Secondary | ICD-10-CM | POA: Diagnosis not present

## 2018-05-24 DIAGNOSIS — E109 Type 1 diabetes mellitus without complications: Secondary | ICD-10-CM | POA: Diagnosis not present

## 2018-05-24 DIAGNOSIS — E7800 Pure hypercholesterolemia, unspecified: Secondary | ICD-10-CM | POA: Diagnosis not present

## 2018-06-08 DIAGNOSIS — E109 Type 1 diabetes mellitus without complications: Secondary | ICD-10-CM | POA: Diagnosis not present

## 2018-06-08 DIAGNOSIS — F329 Major depressive disorder, single episode, unspecified: Secondary | ICD-10-CM | POA: Diagnosis not present

## 2018-06-08 DIAGNOSIS — B20 Human immunodeficiency virus [HIV] disease: Secondary | ICD-10-CM | POA: Diagnosis not present

## 2018-06-08 DIAGNOSIS — D696 Thrombocytopenia, unspecified: Secondary | ICD-10-CM | POA: Diagnosis not present

## 2018-06-08 DIAGNOSIS — N401 Enlarged prostate with lower urinary tract symptoms: Secondary | ICD-10-CM | POA: Diagnosis not present

## 2018-06-08 DIAGNOSIS — E1159 Type 2 diabetes mellitus with other circulatory complications: Secondary | ICD-10-CM | POA: Diagnosis not present

## 2018-06-08 DIAGNOSIS — R945 Abnormal results of liver function studies: Secondary | ICD-10-CM | POA: Diagnosis not present

## 2018-06-08 DIAGNOSIS — E7800 Pure hypercholesterolemia, unspecified: Secondary | ICD-10-CM | POA: Diagnosis not present

## 2018-06-08 DIAGNOSIS — K5909 Other constipation: Secondary | ICD-10-CM | POA: Diagnosis not present

## 2018-06-08 DIAGNOSIS — F19982 Other psychoactive substance use, unspecified with psychoactive substance-induced sleep disorder: Secondary | ICD-10-CM | POA: Diagnosis not present

## 2018-06-22 DIAGNOSIS — N3281 Overactive bladder: Secondary | ICD-10-CM | POA: Diagnosis not present

## 2018-06-22 DIAGNOSIS — C61 Malignant neoplasm of prostate: Secondary | ICD-10-CM | POA: Diagnosis not present

## 2018-06-23 DIAGNOSIS — E1159 Type 2 diabetes mellitus with other circulatory complications: Secondary | ICD-10-CM | POA: Diagnosis not present

## 2018-06-23 DIAGNOSIS — K5909 Other constipation: Secondary | ICD-10-CM | POA: Diagnosis not present

## 2018-06-23 DIAGNOSIS — N401 Enlarged prostate with lower urinary tract symptoms: Secondary | ICD-10-CM | POA: Diagnosis not present

## 2018-06-23 DIAGNOSIS — Z23 Encounter for immunization: Secondary | ICD-10-CM | POA: Diagnosis not present

## 2018-06-23 DIAGNOSIS — B20 Human immunodeficiency virus [HIV] disease: Secondary | ICD-10-CM | POA: Diagnosis not present

## 2018-06-23 DIAGNOSIS — F19982 Other psychoactive substance use, unspecified with psychoactive substance-induced sleep disorder: Secondary | ICD-10-CM | POA: Diagnosis not present

## 2018-06-23 DIAGNOSIS — D696 Thrombocytopenia, unspecified: Secondary | ICD-10-CM | POA: Diagnosis not present

## 2018-06-23 DIAGNOSIS — M5416 Radiculopathy, lumbar region: Secondary | ICD-10-CM | POA: Diagnosis not present

## 2018-06-23 DIAGNOSIS — F329 Major depressive disorder, single episode, unspecified: Secondary | ICD-10-CM | POA: Diagnosis not present

## 2018-07-07 DIAGNOSIS — F329 Major depressive disorder, single episode, unspecified: Secondary | ICD-10-CM | POA: Diagnosis not present

## 2018-07-07 DIAGNOSIS — N189 Chronic kidney disease, unspecified: Secondary | ICD-10-CM | POA: Diagnosis not present

## 2018-07-07 DIAGNOSIS — K5909 Other constipation: Secondary | ICD-10-CM | POA: Diagnosis not present

## 2018-07-07 DIAGNOSIS — E1159 Type 2 diabetes mellitus with other circulatory complications: Secondary | ICD-10-CM | POA: Diagnosis not present

## 2018-07-07 DIAGNOSIS — B20 Human immunodeficiency virus [HIV] disease: Secondary | ICD-10-CM | POA: Diagnosis not present

## 2018-07-07 DIAGNOSIS — D696 Thrombocytopenia, unspecified: Secondary | ICD-10-CM | POA: Diagnosis not present

## 2018-07-07 DIAGNOSIS — N401 Enlarged prostate with lower urinary tract symptoms: Secondary | ICD-10-CM | POA: Diagnosis not present

## 2018-07-07 DIAGNOSIS — R945 Abnormal results of liver function studies: Secondary | ICD-10-CM | POA: Diagnosis not present

## 2018-07-07 DIAGNOSIS — F200 Paranoid schizophrenia: Secondary | ICD-10-CM | POA: Diagnosis not present

## 2018-07-21 DIAGNOSIS — B20 Human immunodeficiency virus [HIV] disease: Secondary | ICD-10-CM | POA: Diagnosis not present

## 2018-07-21 DIAGNOSIS — M5416 Radiculopathy, lumbar region: Secondary | ICD-10-CM | POA: Diagnosis not present

## 2018-07-21 DIAGNOSIS — F19982 Other psychoactive substance use, unspecified with psychoactive substance-induced sleep disorder: Secondary | ICD-10-CM | POA: Diagnosis not present

## 2018-07-21 DIAGNOSIS — N401 Enlarged prostate with lower urinary tract symptoms: Secondary | ICD-10-CM | POA: Diagnosis not present

## 2018-07-21 DIAGNOSIS — K5909 Other constipation: Secondary | ICD-10-CM | POA: Diagnosis not present

## 2018-07-21 DIAGNOSIS — D696 Thrombocytopenia, unspecified: Secondary | ICD-10-CM | POA: Diagnosis not present

## 2018-07-21 DIAGNOSIS — E1159 Type 2 diabetes mellitus with other circulatory complications: Secondary | ICD-10-CM | POA: Diagnosis not present

## 2018-07-21 DIAGNOSIS — R945 Abnormal results of liver function studies: Secondary | ICD-10-CM | POA: Diagnosis not present

## 2018-07-21 DIAGNOSIS — F329 Major depressive disorder, single episode, unspecified: Secondary | ICD-10-CM | POA: Diagnosis not present

## 2018-07-31 ENCOUNTER — Other Ambulatory Visit: Payer: Self-pay | Admitting: Internal Medicine

## 2018-08-11 DIAGNOSIS — E113293 Type 2 diabetes mellitus with mild nonproliferative diabetic retinopathy without macular edema, bilateral: Secondary | ICD-10-CM | POA: Diagnosis not present

## 2018-08-11 DIAGNOSIS — M5416 Radiculopathy, lumbar region: Secondary | ICD-10-CM | POA: Diagnosis not present

## 2018-08-11 DIAGNOSIS — K5909 Other constipation: Secondary | ICD-10-CM | POA: Diagnosis not present

## 2018-08-11 DIAGNOSIS — F19982 Other psychoactive substance use, unspecified with psychoactive substance-induced sleep disorder: Secondary | ICD-10-CM | POA: Diagnosis not present

## 2018-08-11 DIAGNOSIS — B20 Human immunodeficiency virus [HIV] disease: Secondary | ICD-10-CM | POA: Diagnosis not present

## 2018-08-11 DIAGNOSIS — F329 Major depressive disorder, single episode, unspecified: Secondary | ICD-10-CM | POA: Diagnosis not present

## 2018-08-11 DIAGNOSIS — E1159 Type 2 diabetes mellitus with other circulatory complications: Secondary | ICD-10-CM | POA: Diagnosis not present

## 2018-08-11 DIAGNOSIS — H26492 Other secondary cataract, left eye: Secondary | ICD-10-CM | POA: Diagnosis not present

## 2018-08-11 DIAGNOSIS — R945 Abnormal results of liver function studies: Secondary | ICD-10-CM | POA: Diagnosis not present

## 2018-08-11 DIAGNOSIS — D696 Thrombocytopenia, unspecified: Secondary | ICD-10-CM | POA: Diagnosis not present

## 2018-08-11 DIAGNOSIS — N401 Enlarged prostate with lower urinary tract symptoms: Secondary | ICD-10-CM | POA: Diagnosis not present

## 2018-09-15 DIAGNOSIS — M5416 Radiculopathy, lumbar region: Secondary | ICD-10-CM | POA: Diagnosis not present

## 2018-09-15 DIAGNOSIS — N401 Enlarged prostate with lower urinary tract symptoms: Secondary | ICD-10-CM | POA: Diagnosis not present

## 2018-09-15 DIAGNOSIS — D696 Thrombocytopenia, unspecified: Secondary | ICD-10-CM | POA: Diagnosis not present

## 2018-09-15 DIAGNOSIS — F329 Major depressive disorder, single episode, unspecified: Secondary | ICD-10-CM | POA: Diagnosis not present

## 2018-09-15 DIAGNOSIS — E1159 Type 2 diabetes mellitus with other circulatory complications: Secondary | ICD-10-CM | POA: Diagnosis not present

## 2018-09-15 DIAGNOSIS — B20 Human immunodeficiency virus [HIV] disease: Secondary | ICD-10-CM | POA: Diagnosis not present

## 2018-09-15 DIAGNOSIS — K5909 Other constipation: Secondary | ICD-10-CM | POA: Diagnosis not present

## 2018-09-15 DIAGNOSIS — F19982 Other psychoactive substance use, unspecified with psychoactive substance-induced sleep disorder: Secondary | ICD-10-CM | POA: Diagnosis not present

## 2018-09-15 DIAGNOSIS — R945 Abnormal results of liver function studies: Secondary | ICD-10-CM | POA: Diagnosis not present

## 2018-10-06 DIAGNOSIS — H26492 Other secondary cataract, left eye: Secondary | ICD-10-CM | POA: Diagnosis not present

## 2018-10-09 DIAGNOSIS — R3129 Other microscopic hematuria: Secondary | ICD-10-CM | POA: Diagnosis not present

## 2018-10-09 DIAGNOSIS — C61 Malignant neoplasm of prostate: Secondary | ICD-10-CM | POA: Diagnosis not present

## 2018-10-13 DIAGNOSIS — H524 Presbyopia: Secondary | ICD-10-CM | POA: Diagnosis not present

## 2018-10-20 DIAGNOSIS — E1159 Type 2 diabetes mellitus with other circulatory complications: Secondary | ICD-10-CM | POA: Diagnosis not present

## 2018-10-20 DIAGNOSIS — F19982 Other psychoactive substance use, unspecified with psychoactive substance-induced sleep disorder: Secondary | ICD-10-CM | POA: Diagnosis not present

## 2018-10-20 DIAGNOSIS — D696 Thrombocytopenia, unspecified: Secondary | ICD-10-CM | POA: Diagnosis not present

## 2018-10-20 DIAGNOSIS — B20 Human immunodeficiency virus [HIV] disease: Secondary | ICD-10-CM | POA: Diagnosis not present

## 2018-10-20 DIAGNOSIS — M5416 Radiculopathy, lumbar region: Secondary | ICD-10-CM | POA: Diagnosis not present

## 2018-10-20 DIAGNOSIS — F329 Major depressive disorder, single episode, unspecified: Secondary | ICD-10-CM | POA: Diagnosis not present

## 2018-10-20 DIAGNOSIS — Z139 Encounter for screening, unspecified: Secondary | ICD-10-CM | POA: Diagnosis not present

## 2018-10-20 DIAGNOSIS — E109 Type 1 diabetes mellitus without complications: Secondary | ICD-10-CM | POA: Diagnosis not present

## 2018-10-20 DIAGNOSIS — N401 Enlarged prostate with lower urinary tract symptoms: Secondary | ICD-10-CM | POA: Diagnosis not present

## 2018-10-20 DIAGNOSIS — E7800 Pure hypercholesterolemia, unspecified: Secondary | ICD-10-CM | POA: Diagnosis not present

## 2018-11-06 DIAGNOSIS — B20 Human immunodeficiency virus [HIV] disease: Secondary | ICD-10-CM | POA: Diagnosis not present

## 2018-11-06 DIAGNOSIS — Z79899 Other long term (current) drug therapy: Secondary | ICD-10-CM | POA: Diagnosis not present

## 2018-11-06 DIAGNOSIS — Z794 Long term (current) use of insulin: Secondary | ICD-10-CM | POA: Diagnosis not present

## 2018-11-06 DIAGNOSIS — N189 Chronic kidney disease, unspecified: Secondary | ICD-10-CM | POA: Diagnosis not present

## 2018-11-06 DIAGNOSIS — Z23 Encounter for immunization: Secondary | ICD-10-CM | POA: Diagnosis not present

## 2018-11-09 DIAGNOSIS — Z79899 Other long term (current) drug therapy: Secondary | ICD-10-CM | POA: Diagnosis not present

## 2018-11-09 DIAGNOSIS — B20 Human immunodeficiency virus [HIV] disease: Secondary | ICD-10-CM | POA: Diagnosis not present

## 2018-11-09 DIAGNOSIS — Z23 Encounter for immunization: Secondary | ICD-10-CM | POA: Diagnosis not present

## 2018-11-09 DIAGNOSIS — N189 Chronic kidney disease, unspecified: Secondary | ICD-10-CM | POA: Diagnosis not present

## 2018-11-09 DIAGNOSIS — Z794 Long term (current) use of insulin: Secondary | ICD-10-CM | POA: Diagnosis not present

## 2018-11-17 DIAGNOSIS — F19980 Other psychoactive substance use, unspecified with psychoactive substance-induced anxiety disorder: Secondary | ICD-10-CM | POA: Diagnosis not present

## 2018-11-17 DIAGNOSIS — B20 Human immunodeficiency virus [HIV] disease: Secondary | ICD-10-CM | POA: Diagnosis not present

## 2018-11-17 DIAGNOSIS — N401 Enlarged prostate with lower urinary tract symptoms: Secondary | ICD-10-CM | POA: Diagnosis not present

## 2018-11-17 DIAGNOSIS — J069 Acute upper respiratory infection, unspecified: Secondary | ICD-10-CM | POA: Diagnosis not present

## 2018-11-17 DIAGNOSIS — F329 Major depressive disorder, single episode, unspecified: Secondary | ICD-10-CM | POA: Diagnosis not present

## 2018-11-17 DIAGNOSIS — D696 Thrombocytopenia, unspecified: Secondary | ICD-10-CM | POA: Diagnosis not present

## 2018-11-17 DIAGNOSIS — K5909 Other constipation: Secondary | ICD-10-CM | POA: Diagnosis not present

## 2018-11-17 DIAGNOSIS — N184 Chronic kidney disease, stage 4 (severe): Secondary | ICD-10-CM | POA: Diagnosis not present

## 2018-11-17 DIAGNOSIS — M5416 Radiculopathy, lumbar region: Secondary | ICD-10-CM | POA: Diagnosis not present

## 2018-11-23 DIAGNOSIS — H26491 Other secondary cataract, right eye: Secondary | ICD-10-CM | POA: Diagnosis not present

## 2018-12-01 DIAGNOSIS — E7800 Pure hypercholesterolemia, unspecified: Secondary | ICD-10-CM | POA: Diagnosis not present

## 2018-12-01 DIAGNOSIS — R945 Abnormal results of liver function studies: Secondary | ICD-10-CM | POA: Diagnosis not present

## 2018-12-01 DIAGNOSIS — N401 Enlarged prostate with lower urinary tract symptoms: Secondary | ICD-10-CM | POA: Diagnosis not present

## 2018-12-01 DIAGNOSIS — F200 Paranoid schizophrenia: Secondary | ICD-10-CM | POA: Diagnosis not present

## 2018-12-01 DIAGNOSIS — B20 Human immunodeficiency virus [HIV] disease: Secondary | ICD-10-CM | POA: Diagnosis not present

## 2018-12-01 DIAGNOSIS — K5909 Other constipation: Secondary | ICD-10-CM | POA: Diagnosis not present

## 2018-12-01 DIAGNOSIS — E1159 Type 2 diabetes mellitus with other circulatory complications: Secondary | ICD-10-CM | POA: Diagnosis not present

## 2018-12-01 DIAGNOSIS — D696 Thrombocytopenia, unspecified: Secondary | ICD-10-CM | POA: Diagnosis not present

## 2018-12-01 DIAGNOSIS — F329 Major depressive disorder, single episode, unspecified: Secondary | ICD-10-CM | POA: Diagnosis not present

## 2018-12-12 DIAGNOSIS — I129 Hypertensive chronic kidney disease with stage 1 through stage 4 chronic kidney disease, or unspecified chronic kidney disease: Secondary | ICD-10-CM | POA: Diagnosis not present

## 2018-12-12 DIAGNOSIS — R300 Dysuria: Secondary | ICD-10-CM | POA: Diagnosis not present

## 2018-12-12 DIAGNOSIS — E1122 Type 2 diabetes mellitus with diabetic chronic kidney disease: Secondary | ICD-10-CM | POA: Diagnosis not present

## 2018-12-12 DIAGNOSIS — Z8546 Personal history of malignant neoplasm of prostate: Secondary | ICD-10-CM | POA: Diagnosis not present

## 2018-12-12 DIAGNOSIS — B20 Human immunodeficiency virus [HIV] disease: Secondary | ICD-10-CM | POA: Diagnosis not present

## 2018-12-12 DIAGNOSIS — N184 Chronic kidney disease, stage 4 (severe): Secondary | ICD-10-CM | POA: Diagnosis not present

## 2018-12-15 DIAGNOSIS — B20 Human immunodeficiency virus [HIV] disease: Secondary | ICD-10-CM | POA: Diagnosis not present

## 2018-12-15 DIAGNOSIS — D696 Thrombocytopenia, unspecified: Secondary | ICD-10-CM | POA: Diagnosis not present

## 2018-12-15 DIAGNOSIS — K5909 Other constipation: Secondary | ICD-10-CM | POA: Diagnosis not present

## 2018-12-15 DIAGNOSIS — N401 Enlarged prostate with lower urinary tract symptoms: Secondary | ICD-10-CM | POA: Diagnosis not present

## 2018-12-15 DIAGNOSIS — E7800 Pure hypercholesterolemia, unspecified: Secondary | ICD-10-CM | POA: Diagnosis not present

## 2018-12-15 DIAGNOSIS — E1159 Type 2 diabetes mellitus with other circulatory complications: Secondary | ICD-10-CM | POA: Diagnosis not present

## 2018-12-15 DIAGNOSIS — F200 Paranoid schizophrenia: Secondary | ICD-10-CM | POA: Diagnosis not present

## 2018-12-15 DIAGNOSIS — R945 Abnormal results of liver function studies: Secondary | ICD-10-CM | POA: Diagnosis not present

## 2018-12-15 DIAGNOSIS — F329 Major depressive disorder, single episode, unspecified: Secondary | ICD-10-CM | POA: Diagnosis not present

## 2018-12-22 ENCOUNTER — Other Ambulatory Visit (HOSPITAL_COMMUNITY): Payer: Self-pay | Admitting: Internal Medicine

## 2018-12-22 DIAGNOSIS — R809 Proteinuria, unspecified: Secondary | ICD-10-CM

## 2018-12-22 DIAGNOSIS — R319 Hematuria, unspecified: Secondary | ICD-10-CM

## 2019-01-11 DIAGNOSIS — N3281 Overactive bladder: Secondary | ICD-10-CM | POA: Diagnosis not present

## 2019-01-11 DIAGNOSIS — C61 Malignant neoplasm of prostate: Secondary | ICD-10-CM | POA: Diagnosis not present

## 2019-01-12 DIAGNOSIS — B20 Human immunodeficiency virus [HIV] disease: Secondary | ICD-10-CM | POA: Diagnosis not present

## 2019-01-12 DIAGNOSIS — F329 Major depressive disorder, single episode, unspecified: Secondary | ICD-10-CM | POA: Diagnosis not present

## 2019-01-12 DIAGNOSIS — F200 Paranoid schizophrenia: Secondary | ICD-10-CM | POA: Diagnosis not present

## 2019-01-12 DIAGNOSIS — R945 Abnormal results of liver function studies: Secondary | ICD-10-CM | POA: Diagnosis not present

## 2019-01-12 DIAGNOSIS — K5909 Other constipation: Secondary | ICD-10-CM | POA: Diagnosis not present

## 2019-01-12 DIAGNOSIS — E7800 Pure hypercholesterolemia, unspecified: Secondary | ICD-10-CM | POA: Diagnosis not present

## 2019-01-12 DIAGNOSIS — N401 Enlarged prostate with lower urinary tract symptoms: Secondary | ICD-10-CM | POA: Diagnosis not present

## 2019-01-12 DIAGNOSIS — D696 Thrombocytopenia, unspecified: Secondary | ICD-10-CM | POA: Diagnosis not present

## 2019-01-12 DIAGNOSIS — E1159 Type 2 diabetes mellitus with other circulatory complications: Secondary | ICD-10-CM | POA: Diagnosis not present

## 2019-01-23 DIAGNOSIS — N184 Chronic kidney disease, stage 4 (severe): Secondary | ICD-10-CM | POA: Diagnosis not present

## 2019-01-23 DIAGNOSIS — R809 Proteinuria, unspecified: Secondary | ICD-10-CM | POA: Diagnosis not present

## 2019-01-23 DIAGNOSIS — I129 Hypertensive chronic kidney disease with stage 1 through stage 4 chronic kidney disease, or unspecified chronic kidney disease: Secondary | ICD-10-CM | POA: Diagnosis not present

## 2019-01-23 DIAGNOSIS — B20 Human immunodeficiency virus [HIV] disease: Secondary | ICD-10-CM | POA: Diagnosis not present

## 2019-01-23 DIAGNOSIS — E1122 Type 2 diabetes mellitus with diabetic chronic kidney disease: Secondary | ICD-10-CM | POA: Diagnosis not present

## 2019-01-23 DIAGNOSIS — N179 Acute kidney failure, unspecified: Secondary | ICD-10-CM | POA: Diagnosis not present

## 2019-01-23 DIAGNOSIS — Z8546 Personal history of malignant neoplasm of prostate: Secondary | ICD-10-CM | POA: Diagnosis not present

## 2019-01-23 DIAGNOSIS — R319 Hematuria, unspecified: Secondary | ICD-10-CM | POA: Diagnosis not present

## 2019-01-26 DIAGNOSIS — E7800 Pure hypercholesterolemia, unspecified: Secondary | ICD-10-CM | POA: Diagnosis not present

## 2019-01-26 DIAGNOSIS — E1159 Type 2 diabetes mellitus with other circulatory complications: Secondary | ICD-10-CM | POA: Diagnosis not present

## 2019-01-26 DIAGNOSIS — F329 Major depressive disorder, single episode, unspecified: Secondary | ICD-10-CM | POA: Diagnosis not present

## 2019-01-26 DIAGNOSIS — D696 Thrombocytopenia, unspecified: Secondary | ICD-10-CM | POA: Diagnosis not present

## 2019-01-26 DIAGNOSIS — K5909 Other constipation: Secondary | ICD-10-CM | POA: Diagnosis not present

## 2019-01-26 DIAGNOSIS — B20 Human immunodeficiency virus [HIV] disease: Secondary | ICD-10-CM | POA: Diagnosis not present

## 2019-01-26 DIAGNOSIS — F200 Paranoid schizophrenia: Secondary | ICD-10-CM | POA: Diagnosis not present

## 2019-01-26 DIAGNOSIS — R945 Abnormal results of liver function studies: Secondary | ICD-10-CM | POA: Diagnosis not present

## 2019-01-26 DIAGNOSIS — E109 Type 1 diabetes mellitus without complications: Secondary | ICD-10-CM | POA: Diagnosis not present

## 2019-01-26 DIAGNOSIS — N401 Enlarged prostate with lower urinary tract symptoms: Secondary | ICD-10-CM | POA: Diagnosis not present

## 2019-01-29 ENCOUNTER — Other Ambulatory Visit (HOSPITAL_COMMUNITY): Payer: Self-pay | Admitting: Internal Medicine

## 2019-01-29 DIAGNOSIS — B20 Human immunodeficiency virus [HIV] disease: Secondary | ICD-10-CM

## 2019-01-29 DIAGNOSIS — I10 Essential (primary) hypertension: Secondary | ICD-10-CM

## 2019-01-29 DIAGNOSIS — N184 Chronic kidney disease, stage 4 (severe): Secondary | ICD-10-CM

## 2019-01-29 DIAGNOSIS — R809 Proteinuria, unspecified: Secondary | ICD-10-CM

## 2019-01-29 DIAGNOSIS — N289 Disorder of kidney and ureter, unspecified: Secondary | ICD-10-CM

## 2019-01-29 DIAGNOSIS — R319 Hematuria, unspecified: Secondary | ICD-10-CM

## 2019-01-30 DIAGNOSIS — E109 Type 1 diabetes mellitus without complications: Secondary | ICD-10-CM | POA: Diagnosis not present

## 2019-02-09 DIAGNOSIS — F329 Major depressive disorder, single episode, unspecified: Secondary | ICD-10-CM | POA: Diagnosis not present

## 2019-02-09 DIAGNOSIS — M5416 Radiculopathy, lumbar region: Secondary | ICD-10-CM | POA: Diagnosis not present

## 2019-02-09 DIAGNOSIS — N189 Chronic kidney disease, unspecified: Secondary | ICD-10-CM | POA: Diagnosis not present

## 2019-02-09 DIAGNOSIS — D696 Thrombocytopenia, unspecified: Secondary | ICD-10-CM | POA: Diagnosis not present

## 2019-02-09 DIAGNOSIS — F19982 Other psychoactive substance use, unspecified with psychoactive substance-induced sleep disorder: Secondary | ICD-10-CM | POA: Diagnosis not present

## 2019-02-09 DIAGNOSIS — F200 Paranoid schizophrenia: Secondary | ICD-10-CM | POA: Diagnosis not present

## 2019-02-09 DIAGNOSIS — N184 Chronic kidney disease, stage 4 (severe): Secondary | ICD-10-CM | POA: Diagnosis not present

## 2019-02-09 DIAGNOSIS — B20 Human immunodeficiency virus [HIV] disease: Secondary | ICD-10-CM | POA: Diagnosis not present

## 2019-02-09 DIAGNOSIS — N401 Enlarged prostate with lower urinary tract symptoms: Secondary | ICD-10-CM | POA: Diagnosis not present

## 2019-02-09 DIAGNOSIS — R945 Abnormal results of liver function studies: Secondary | ICD-10-CM | POA: Diagnosis not present

## 2019-02-17 ENCOUNTER — Other Ambulatory Visit: Payer: Self-pay | Admitting: Internal Medicine

## 2019-02-17 DIAGNOSIS — B20 Human immunodeficiency virus [HIV] disease: Secondary | ICD-10-CM

## 2019-02-23 DIAGNOSIS — R351 Nocturia: Secondary | ICD-10-CM | POA: Diagnosis not present

## 2019-02-23 DIAGNOSIS — M5416 Radiculopathy, lumbar region: Secondary | ICD-10-CM | POA: Diagnosis not present

## 2019-02-23 DIAGNOSIS — N401 Enlarged prostate with lower urinary tract symptoms: Secondary | ICD-10-CM | POA: Diagnosis not present

## 2019-02-23 DIAGNOSIS — D696 Thrombocytopenia, unspecified: Secondary | ICD-10-CM | POA: Diagnosis not present

## 2019-02-23 DIAGNOSIS — N184 Chronic kidney disease, stage 4 (severe): Secondary | ICD-10-CM | POA: Diagnosis not present

## 2019-02-23 DIAGNOSIS — B20 Human immunodeficiency virus [HIV] disease: Secondary | ICD-10-CM | POA: Diagnosis not present

## 2019-02-23 DIAGNOSIS — F200 Paranoid schizophrenia: Secondary | ICD-10-CM | POA: Diagnosis not present

## 2019-02-23 DIAGNOSIS — K5909 Other constipation: Secondary | ICD-10-CM | POA: Diagnosis not present

## 2019-02-23 DIAGNOSIS — R945 Abnormal results of liver function studies: Secondary | ICD-10-CM | POA: Diagnosis not present

## 2019-02-26 ENCOUNTER — Other Ambulatory Visit: Payer: Self-pay | Admitting: Radiology

## 2019-02-27 ENCOUNTER — Other Ambulatory Visit: Payer: Self-pay

## 2019-02-27 ENCOUNTER — Ambulatory Visit (HOSPITAL_COMMUNITY)
Admission: RE | Admit: 2019-02-27 | Discharge: 2019-02-27 | Disposition: A | Discharge status: Home / Self Care | Payer: Medicare HMO | Source: Ambulatory Visit | Attending: Internal Medicine | Admitting: Internal Medicine

## 2019-02-27 ENCOUNTER — Encounter (HOSPITAL_COMMUNITY): Payer: Self-pay

## 2019-02-27 DIAGNOSIS — M1990 Unspecified osteoarthritis, unspecified site: Secondary | ICD-10-CM | POA: Insufficient documentation

## 2019-02-27 DIAGNOSIS — F329 Major depressive disorder, single episode, unspecified: Secondary | ICD-10-CM | POA: Insufficient documentation

## 2019-02-27 DIAGNOSIS — Z7901 Long term (current) use of anticoagulants: Secondary | ICD-10-CM | POA: Diagnosis not present

## 2019-02-27 DIAGNOSIS — N289 Disorder of kidney and ureter, unspecified: Secondary | ICD-10-CM | POA: Diagnosis not present

## 2019-02-27 DIAGNOSIS — I10 Essential (primary) hypertension: Secondary | ICD-10-CM

## 2019-02-27 DIAGNOSIS — Z794 Long term (current) use of insulin: Secondary | ICD-10-CM | POA: Diagnosis not present

## 2019-02-27 DIAGNOSIS — B20 Human immunodeficiency virus [HIV] disease: Secondary | ICD-10-CM | POA: Diagnosis not present

## 2019-02-27 DIAGNOSIS — E1122 Type 2 diabetes mellitus with diabetic chronic kidney disease: Secondary | ICD-10-CM | POA: Insufficient documentation

## 2019-02-27 DIAGNOSIS — F419 Anxiety disorder, unspecified: Secondary | ICD-10-CM | POA: Diagnosis not present

## 2019-02-27 DIAGNOSIS — Z79899 Other long term (current) drug therapy: Secondary | ICD-10-CM | POA: Insufficient documentation

## 2019-02-27 DIAGNOSIS — N184 Chronic kidney disease, stage 4 (severe): Secondary | ICD-10-CM | POA: Insufficient documentation

## 2019-02-27 DIAGNOSIS — I129 Hypertensive chronic kidney disease with stage 1 through stage 4 chronic kidney disease, or unspecified chronic kidney disease: Secondary | ICD-10-CM | POA: Diagnosis not present

## 2019-02-27 DIAGNOSIS — Z21 Asymptomatic human immunodeficiency virus [HIV] infection status: Secondary | ICD-10-CM | POA: Diagnosis not present

## 2019-02-27 DIAGNOSIS — N189 Chronic kidney disease, unspecified: Secondary | ICD-10-CM | POA: Diagnosis not present

## 2019-02-27 DIAGNOSIS — R319 Hematuria, unspecified: Secondary | ICD-10-CM

## 2019-02-27 DIAGNOSIS — R809 Proteinuria, unspecified: Secondary | ICD-10-CM | POA: Insufficient documentation

## 2019-02-27 DIAGNOSIS — E785 Hyperlipidemia, unspecified: Secondary | ICD-10-CM | POA: Insufficient documentation

## 2019-02-27 LAB — GLUCOSE, CAPILLARY
Glucose-Capillary: 119 mg/dL — ABNORMAL HIGH (ref 70–99)
Glucose-Capillary: 96 mg/dL (ref 70–99)

## 2019-02-27 LAB — CBC
HCT: 26.7 % — ABNORMAL LOW (ref 39.0–52.0)
Hemoglobin: 9.4 g/dL — ABNORMAL LOW (ref 13.0–17.0)
MCH: 34.8 pg — ABNORMAL HIGH (ref 26.0–34.0)
MCHC: 35.2 g/dL (ref 30.0–36.0)
MCV: 98.9 fL (ref 80.0–100.0)
Platelets: 99 10*3/uL — ABNORMAL LOW (ref 150–400)
RBC: 2.7 MIL/uL — ABNORMAL LOW (ref 4.22–5.81)
RDW: 12.8 % (ref 11.5–15.5)
WBC: 4.1 10*3/uL (ref 4.0–10.5)
nRBC: 0 % (ref 0.0–0.2)

## 2019-02-27 LAB — PROTIME-INR
INR: 0.9 (ref 0.8–1.2)
Prothrombin Time: 12.5 seconds (ref 11.4–15.2)

## 2019-02-27 MED ORDER — LIDOCAINE HCL (PF) 1 % IJ SOLN
INTRAMUSCULAR | Status: AC
  Filled 2019-02-27: qty 30

## 2019-02-27 MED ORDER — GELATIN ABSORBABLE 12-7 MM EX MISC
CUTANEOUS | Status: AC
  Filled 2019-02-27: qty 1

## 2019-02-27 MED ORDER — SODIUM CHLORIDE 0.9 % IV SOLN: INTRAVENOUS | Status: DC

## 2019-02-27 MED ORDER — MIDAZOLAM HCL 2 MG/2ML IJ SOLN
INTRAMUSCULAR | Status: AC
  Filled 2019-02-27: qty 2

## 2019-02-27 MED ORDER — FENTANYL CITRATE (PF) 100 MCG/2ML IJ SOLN
INTRAMUSCULAR | Status: AC | PRN
  Administered 2019-02-27: 50 ug via INTRAVENOUS

## 2019-02-27 MED ORDER — MIDAZOLAM HCL 2 MG/2ML IJ SOLN
INTRAMUSCULAR | Status: AC | PRN
  Administered 2019-02-27: 1 mg via INTRAVENOUS

## 2019-02-27 MED ORDER — FENTANYL CITRATE (PF) 100 MCG/2ML IJ SOLN
INTRAMUSCULAR | Status: AC
  Filled 2019-02-27: qty 2

## 2019-02-27 NOTE — Discharge Instructions (Signed)
Percutaneous Kidney Biopsy, Care After °This sheet gives you information about how to care for yourself after your procedure. Your health care provider may also give you more specific instructions. If you have problems or questions, contact your health care provider. °What can I expect after the procedure? °After the procedure, it is common to have: °· Pain or soreness near the area where the needle went through your skin (biopsy site). °· Bright pink or cloudy urine for 24 hours after the procedure. °Follow these instructions at home: °Activity °· Return to your normal activities as told by your health care provider. Ask your health care provider what activities are safe for you. °· Do not drive for 24 hours if you were given a medicine to help you relax (sedative). °· Do not lift anything that is heavier than 10 lb (4.5 kg) until your health care provider tells you that it is safe. °· Avoid activities that take a lot of effort (are strenuous) until your health care provider approves. Most people will have to wait 2 weeks before returning to activities such as exercise or sexual intercourse. °General instructions ° °· Take over-the-counter and prescription medicines only as told by your health care provider. °· You may eat and drink after your procedure. Follow instructions from your health care provider about eating or drinking restrictions. °· Check your biopsy site every day for signs of infection. Check for: °? More redness, swelling, or pain. °? More fluid or blood. °? Warmth. °? Pus or a bad smell. °· Keep all follow-up visits as told by your health care provider. This is important. °Contact a health care provider if: °· You have more redness, swelling, or pain around your biopsy site. °· You have more fluid or blood coming from your biopsy site. °· Your biopsy site feels warm to the touch. °· You have pus or a bad smell coming from your biopsy site. °· You have blood in your urine more than 24 hours after  your procedure. °Get help right away if: °· You have dark red or brown urine. °· You have a fever. °· You are unable to urinate. °· You feel burning when you urinate. °· You feel faint. °· You have severe pain in your abdomen or side. °This information is not intended to replace advice given to you by your health care provider. Make sure you discuss any questions you have with your health care provider. °Document Released: 05/16/2013 Document Revised: 06/25/2016 Document Reviewed: 06/25/2016 °Elsevier Interactive Patient Education © 2019 [employer] Inc[employer] °Moderate Conscious Sedation, Adult, Care After °These instructions provide you with information about caring for yourself after your procedure. Your health care provider may also give you more specific instructions. Your treatment has been planned according to current medical practices, but problems sometimes occur. Call your health care provider if you have any problems or questions after your procedure. °What can I expect after the procedure? °After your procedure, it is common: °· To feel sleepy for several hours. °· To feel clumsy and have poor balance for several hours. °· To have poor judgment for several hours. °· To vomit if you eat too soon. °Follow these instructions at home: °For at least 24 hours after the procedure: ° °· Do not: °? Participate in activities where you could fall or become injured. °? Drive. °? Use heavy machinery. °? Drink alcohol. °? Take sleeping pills or medicines that cause drowsiness. °? Make important decisions or sign legal documents. °? Take care of children on   your own. °· Rest. °Eating and drinking °· Follow the diet recommended by your health care provider. °· If you vomit: °? Drink water, juice, or soup when you can drink without vomiting. °? Make sure you have little or no nausea before eating solid foods. °General instructions °· Have a responsible adult stay with you until you are awake and alert. °· Take over-the-counter  and prescription medicines only as told by your health care provider. °· If you smoke, do not smoke without supervision. °· Keep all follow-up visits as told by your health care provider. This is important. °Contact a health care provider if: °· You keep feeling nauseous or you keep vomiting. °· You feel light-headed. °· You develop a rash. °· You have a fever. °Get help right away if: °· You have trouble breathing. °This information is not intended to replace advice given to you by your health care provider. Make sure you discuss any questions you have with your health care provider. °Document Released: 07/04/2013 Document Revised: 02/16/2016 Document Reviewed: 01/03/2016 °Elsevier Interactive Patient Education © 2019 Elsevier Inc. ° °

## 2019-02-27 NOTE — H&P (Signed)
Chief Complaint: Patient was seen in consultation today for random renal biopsy at the request of Kruska,Lindsay A  Referring Physician(s): Jannifer Hick A  Supervising Physician: Daryll Brod  Patient Status: [facility] - Out-pt  History of Present Illness: Darrell [name] is a 78 y.o. male   CKD4 Proteinuria Hematuria Hx HIV  Scheduled for random renal biopsy per nephrology  Past Medical History:  Diagnosis Date  . Anxiety   . Arthritis   . Cancer ([place])   . Cataract   . Depression   . Diabetes mellitus without complication ([place])   . HIV infection ([place])   . Hyperlipidemia   . Hypertension   . Substance abuse [facility])     Past Surgical History:  Procedure Laterality Date  . EYE SURGERY    . FRACTURE SURGERY    . JOINT REPLACEMENT      Allergies: Percodan [oxycodone-aspirin]  Medications: Prior to Admission medications   Medication Sig Start Date End Date Taking? Authorizing Provider  amLODipine (NORVASC) 10 MG tablet Take 10 mg by mouth daily.   Yes [provider]  atorvastatin (LIPITOR) 20 MG tablet TAKE ONE TABLET BY MOUTH EVERY DAY Patient taking differently: Take 20 mg by mouth daily.  10/02/12  Yes Tommy Medal, Lavell Islam, MD  Calcium Carbonate (CALCIUM 600 PO) Take 600 mg by mouth daily.   Yes [provider]  DULoxetine (CYMBALTA) 60 MG capsule Take 60 mg by mouth daily.   Yes [provider]  emtricitabine-rilpivir-tenofovir AF (ODEFSEY) 200-25-25 MG TABS tablet Take 1 tablet by mouth daily. 04/26/16  Yes [provider]  fesoterodine (TOVIAZ) 8 MG TB24 tablet Take 8 mg by mouth daily.   Yes [provider]  insulin NPH-regular Human (NOVOLIN 70/30) (70-30) 100 UNIT/ML injection Inject 100 Units into the skin every morning.    Yes [provider]  Melatonin 5 MG TABS Take 5 mg by mouth at bedtime.   Yes [provider]  mirabegron ER (MYRBETRIQ) 25 MG TB24 tablet Take 25 mg by mouth daily.   Yes  [provider]  MULTIPLE VITAMIN PO Take 1 tablet by mouth daily.   Yes [provider]  Omega-3 Fatty Acids (FISH OIL) 1200 MG CAPS Take 1,200 mg by mouth 2 (two) times daily.    Yes [provider]  psyllium (REGULOID) 0.52 g capsule Take 0.52 g by mouth daily. Fiber Laxative   Yes [provider]  risperiDONE (RISPERDAL) 1 MG tablet Take 1 mg by mouth at bedtime.   Yes [provider]  sodium bicarbonate 650 MG tablet Take 650 mg by mouth 2 (two) times daily.   Yes [provider]  Tamsulosin HCl (FLOMAX) 0.4 MG CAPS TAKE ONE CAPSULE BY MOUTH EVERY DAY 10/20/11  Yes Tommy Medal, Lavell Islam, MD  zolpidem (AMBIEN) 10 MG tablet Take 10 mg by mouth at bedtime as needed for sleep.   Yes [provider]     History reviewed. No pertinent family history.  Social History   Socioeconomic History  . Marital status: Single    Spouse name: Not on file  . Number of children: Not on file  . Years of education: Not on file  . Highest education level: Not on file  Occupational History  . Not on file  Social Needs  . Financial resource strain: Not on file  . Food insecurity:    Worry: Not on file    Inability: Not on file  . Transportation needs:  Medical: Not on file    Non-medical: Not on file  Tobacco Use  . Smoking status: Not on file  Substance and Sexual Activity  . Alcohol use: Not on file  . Drug use: Not on file  . Sexual activity: Not on file  Lifestyle  . Physical activity:    Days per week: Not on file    Minutes per session: Not on file  . Stress: Not on file  Relationships  . Social connections:    Talks on phone: Not on file    Gets together: Not on file    Attends religious service: Not on file    Active member of club or organization: Not on file    Attends meetings of clubs or organizations: Not on file    Relationship status: Not on file  Other Topics Concern  . Not on file  Social History Narrative   . Not on file     Review of Systems: A 12 point ROS discussed and pertinent positives are indicated in the HPI above.  All other systems are negative.  Review of Systems  Constitutional: Negative for activity change, fatigue and fever.  Respiratory: Negative for cough and shortness of breath.   Gastrointestinal: Negative for abdominal pain.  Neurological: Negative for weakness.  Psychiatric/Behavioral: Negative for behavioral problems and confusion.    Vital Signs: BP (!) 155/83   Pulse 86   Temp 97.7 F (36.5 C) (Oral)   Resp 18   Ht 5\' 9"  (1.753 m)   Wt 228 lb (103.4 kg)   SpO2 100%   BMI 33.67 kg/m   Physical Exam Vitals signs reviewed.  Cardiovascular:     Rate and Rhythm: Normal rate and regular rhythm.     Heart sounds: Normal heart sounds.  Pulmonary:     Breath sounds: Normal breath sounds.  Abdominal:     General: There is no distension.  Musculoskeletal: Normal range of motion.  Skin:    General: Skin is warm and dry.  Neurological:     Mental Status: He is alert and oriented to person, place, and time.  Psychiatric:        Mood and Affect: Mood normal.        Behavior: Behavior normal.        Thought Content: Thought content normal.        Judgment: Judgment normal.     Imaging: No results found.  Labs:  CBC: No results for input(s): WBC, HGB, HCT, PLT in the last 8760 hours.  COAGS: No results for input(s): INR, APTT in the last 8760 hours.  BMP: No results for input(s): NA, K, CL, CO2, GLUCOSE, BUN, CALCIUM, CREATININE, GFRNONAA, GFRAA in the last 8760 hours.  Invalid input(s): CMP  LIVER FUNCTION TESTS: No results for input(s): BILITOT, AST, ALT, ALKPHOS, PROT, ALBUMIN in the last 8760 hours.  TUMOR MARKERS: No results for input(s): AFPTM, CEA, CA199, CHROMGRNA in the last 8760 hours.  Assessment and Plan:  CKD 4 Proteinuria; hematuria Scheduled for random renal bx Risks and benefits of random renal bx was discussed with the  patient and/or patient's family including, but not limited to bleeding, infection, damage to adjacent structures or low yield requiring additional tests.  All of the questions were answered and there is agreement to proceed. Consent signed and in chart.   Thank you for this interesting consult.  I greatly enjoyed meeting Samanyu Tinnell and look forward to participating in their care.  A copy of this  report was sent to the requesting provider on this date.  Electronically Signed: Lavonia Drafts, PA-C 02/27/2019, 7:15 AM   I spent a total of  30 Minutes   in face to face in clinical consultation, greater than 50% of which was counseling/coordinating care for random renal bx

## 2019-02-27 NOTE — Procedures (Signed)
CKD  S/p [place] LEFT RENAL BX  No comp Stable ebl min Path pending Full report in pacs

## 2019-02-27 NOTE — Progress Notes (Signed)
Client voided with several small clots and Pam Turpin,PA notified and no new orders noted and ok to d/c home

## 2019-03-06 DIAGNOSIS — N179 Acute kidney failure, unspecified: Secondary | ICD-10-CM | POA: Diagnosis not present

## 2019-03-06 DIAGNOSIS — Z8546 Personal history of malignant neoplasm of prostate: Secondary | ICD-10-CM | POA: Diagnosis not present

## 2019-03-06 DIAGNOSIS — B20 Human immunodeficiency virus [HIV] disease: Secondary | ICD-10-CM | POA: Diagnosis not present

## 2019-03-06 DIAGNOSIS — R319 Hematuria, unspecified: Secondary | ICD-10-CM | POA: Diagnosis not present

## 2019-03-06 DIAGNOSIS — R809 Proteinuria, unspecified: Secondary | ICD-10-CM | POA: Diagnosis not present

## 2019-03-06 DIAGNOSIS — N184 Chronic kidney disease, stage 4 (severe): Secondary | ICD-10-CM | POA: Diagnosis not present

## 2019-03-06 DIAGNOSIS — I129 Hypertensive chronic kidney disease with stage 1 through stage 4 chronic kidney disease, or unspecified chronic kidney disease: Secondary | ICD-10-CM | POA: Diagnosis not present

## 2019-03-06 DIAGNOSIS — E1122 Type 2 diabetes mellitus with diabetic chronic kidney disease: Secondary | ICD-10-CM | POA: Diagnosis not present

## 2019-03-09 ENCOUNTER — Encounter (HOSPITAL_COMMUNITY): Payer: Self-pay | Admitting: Internal Medicine

## 2019-03-12 DIAGNOSIS — K5909 Other constipation: Secondary | ICD-10-CM | POA: Diagnosis not present

## 2019-03-12 DIAGNOSIS — F200 Paranoid schizophrenia: Secondary | ICD-10-CM | POA: Diagnosis not present

## 2019-03-12 DIAGNOSIS — B20 Human immunodeficiency virus [HIV] disease: Secondary | ICD-10-CM | POA: Diagnosis not present

## 2019-03-12 DIAGNOSIS — R351 Nocturia: Secondary | ICD-10-CM | POA: Diagnosis not present

## 2019-03-12 DIAGNOSIS — N184 Chronic kidney disease, stage 4 (severe): Secondary | ICD-10-CM | POA: Diagnosis not present

## 2019-03-12 DIAGNOSIS — D696 Thrombocytopenia, unspecified: Secondary | ICD-10-CM | POA: Diagnosis not present

## 2019-03-12 DIAGNOSIS — M5416 Radiculopathy, lumbar region: Secondary | ICD-10-CM | POA: Diagnosis not present

## 2019-03-12 DIAGNOSIS — R945 Abnormal results of liver function studies: Secondary | ICD-10-CM | POA: Diagnosis not present

## 2019-03-12 DIAGNOSIS — N401 Enlarged prostate with lower urinary tract symptoms: Secondary | ICD-10-CM | POA: Diagnosis not present

## 2019-03-16 DIAGNOSIS — Z0000 Encounter for general adult medical examination without abnormal findings: Secondary | ICD-10-CM | POA: Diagnosis not present

## 2019-03-16 DIAGNOSIS — Z6832 Body mass index (BMI) 32.0-32.9, adult: Secondary | ICD-10-CM | POA: Diagnosis not present

## 2019-03-16 DIAGNOSIS — Z9181 History of falling: Secondary | ICD-10-CM | POA: Diagnosis not present

## 2019-03-16 DIAGNOSIS — Z125 Encounter for screening for malignant neoplasm of prostate: Secondary | ICD-10-CM | POA: Diagnosis not present

## 2019-03-16 DIAGNOSIS — E785 Hyperlipidemia, unspecified: Secondary | ICD-10-CM | POA: Diagnosis not present

## 2019-03-26 DIAGNOSIS — K5909 Other constipation: Secondary | ICD-10-CM | POA: Diagnosis not present

## 2019-03-26 DIAGNOSIS — F200 Paranoid schizophrenia: Secondary | ICD-10-CM | POA: Diagnosis not present

## 2019-03-26 DIAGNOSIS — N401 Enlarged prostate with lower urinary tract symptoms: Secondary | ICD-10-CM | POA: Diagnosis not present

## 2019-03-26 DIAGNOSIS — R945 Abnormal results of liver function studies: Secondary | ICD-10-CM | POA: Diagnosis not present

## 2019-03-26 DIAGNOSIS — D696 Thrombocytopenia, unspecified: Secondary | ICD-10-CM | POA: Diagnosis not present

## 2019-03-26 DIAGNOSIS — M5416 Radiculopathy, lumbar region: Secondary | ICD-10-CM | POA: Diagnosis not present

## 2019-03-26 DIAGNOSIS — N184 Chronic kidney disease, stage 4 (severe): Secondary | ICD-10-CM | POA: Diagnosis not present

## 2019-03-26 DIAGNOSIS — B20 Human immunodeficiency virus [HIV] disease: Secondary | ICD-10-CM | POA: Diagnosis not present

## 2019-03-26 DIAGNOSIS — F19982 Other psychoactive substance use, unspecified with psychoactive substance-induced sleep disorder: Secondary | ICD-10-CM | POA: Diagnosis not present

## 2019-04-09 DIAGNOSIS — B20 Human immunodeficiency virus [HIV] disease: Secondary | ICD-10-CM | POA: Diagnosis not present

## 2019-04-09 DIAGNOSIS — K5909 Other constipation: Secondary | ICD-10-CM | POA: Diagnosis not present

## 2019-04-09 DIAGNOSIS — D696 Thrombocytopenia, unspecified: Secondary | ICD-10-CM | POA: Diagnosis not present

## 2019-04-09 DIAGNOSIS — F19982 Other psychoactive substance use, unspecified with psychoactive substance-induced sleep disorder: Secondary | ICD-10-CM | POA: Diagnosis not present

## 2019-04-09 DIAGNOSIS — R945 Abnormal results of liver function studies: Secondary | ICD-10-CM | POA: Diagnosis not present

## 2019-04-09 DIAGNOSIS — E1159 Type 2 diabetes mellitus with other circulatory complications: Secondary | ICD-10-CM | POA: Diagnosis not present

## 2019-04-09 DIAGNOSIS — M5416 Radiculopathy, lumbar region: Secondary | ICD-10-CM | POA: Diagnosis not present

## 2019-04-09 DIAGNOSIS — N401 Enlarged prostate with lower urinary tract symptoms: Secondary | ICD-10-CM | POA: Diagnosis not present

## 2019-04-09 DIAGNOSIS — N184 Chronic kidney disease, stage 4 (severe): Secondary | ICD-10-CM | POA: Diagnosis not present

## 2019-04-18 ENCOUNTER — Other Ambulatory Visit: Payer: Self-pay

## 2019-04-18 DIAGNOSIS — N189 Chronic kidney disease, unspecified: Secondary | ICD-10-CM

## 2019-04-23 DIAGNOSIS — K5909 Other constipation: Secondary | ICD-10-CM | POA: Diagnosis not present

## 2019-04-23 DIAGNOSIS — B20 Human immunodeficiency virus [HIV] disease: Secondary | ICD-10-CM | POA: Diagnosis not present

## 2019-04-23 DIAGNOSIS — F200 Paranoid schizophrenia: Secondary | ICD-10-CM | POA: Diagnosis not present

## 2019-04-23 DIAGNOSIS — N401 Enlarged prostate with lower urinary tract symptoms: Secondary | ICD-10-CM | POA: Diagnosis not present

## 2019-04-23 DIAGNOSIS — E1159 Type 2 diabetes mellitus with other circulatory complications: Secondary | ICD-10-CM | POA: Diagnosis not present

## 2019-04-23 DIAGNOSIS — R351 Nocturia: Secondary | ICD-10-CM | POA: Diagnosis not present

## 2019-04-23 DIAGNOSIS — E7800 Pure hypercholesterolemia, unspecified: Secondary | ICD-10-CM | POA: Diagnosis not present

## 2019-04-23 DIAGNOSIS — R945 Abnormal results of liver function studies: Secondary | ICD-10-CM | POA: Diagnosis not present

## 2019-04-23 DIAGNOSIS — F19982 Other psychoactive substance use, unspecified with psychoactive substance-induced sleep disorder: Secondary | ICD-10-CM | POA: Diagnosis not present

## 2019-04-23 DIAGNOSIS — D696 Thrombocytopenia, unspecified: Secondary | ICD-10-CM | POA: Diagnosis not present

## 2019-04-24 ENCOUNTER — Encounter: Payer: Self-pay | Admitting: *Deleted

## 2019-04-24 ENCOUNTER — Telehealth (HOSPITAL_COMMUNITY): Payer: Self-pay

## 2019-04-24 NOTE — Telephone Encounter (Signed)
The above patient or their representative was contacted and gave the following answers to these questions:         Do you have any of the following symptoms? No  Fever                    Cough                   Shortness of breath  Do  you have any of the following other symptoms?  No   muscle pain         vomiting,        diarrhea        rash         weakness        red eye        abdominal pain         bruising          bruising or bleeding              joint pain           severe headache    Have you been in contact with someone who was or has been sick in the past 2 weeks? No  Yes                 Unsure                         Unable to assess   Does the person that you were in contact with have any of the following symptoms? N/A   Cough         shortness of breath           muscle pain         vomiting,            diarrhea            rash            weakness           fever            red eye           abdominal pain           bruising  or  bleeding                joint pain                severe headache                COMMENTS OR ACTION PLAN FOR THIS PATIENT:

## 2019-04-25 ENCOUNTER — Ambulatory Visit (HOSPITAL_COMMUNITY)
Admission: RE | Admit: 2019-04-25 | Discharge: 2019-04-25 | Disposition: A | Discharge status: Home / Self Care | Payer: Medicare HMO | Source: Ambulatory Visit | Attending: Vascular Surgery | Admitting: Vascular Surgery

## 2019-04-25 ENCOUNTER — Ambulatory Visit (INDEPENDENT_AMBULATORY_CARE_PROVIDER_SITE_OTHER)
Admission: RE | Admit: 2019-04-25 | Discharge: 2019-04-25 | Disposition: A | Discharge status: Home / Self Care | Payer: Medicare HMO | Source: Ambulatory Visit | Attending: Vascular Surgery | Admitting: Vascular Surgery

## 2019-04-25 ENCOUNTER — Encounter: Payer: Self-pay | Admitting: Vascular Surgery

## 2019-04-25 ENCOUNTER — Other Ambulatory Visit: Payer: Self-pay

## 2019-04-25 ENCOUNTER — Ambulatory Visit (INDEPENDENT_AMBULATORY_CARE_PROVIDER_SITE_OTHER): Payer: Medicare HMO | Admitting: Vascular Surgery

## 2019-04-25 ENCOUNTER — Other Ambulatory Visit: Payer: Self-pay | Admitting: *Deleted

## 2019-04-25 ENCOUNTER — Encounter: Payer: Self-pay | Admitting: *Deleted

## 2019-04-25 VITALS — BP 128/79 | HR 80 | Temp 97.7°F | Resp 20 | Ht 69.0 in | Wt 229.7 lb

## 2019-04-25 DIAGNOSIS — N185 Chronic kidney disease, stage 5: Secondary | ICD-10-CM | POA: Diagnosis not present

## 2019-04-25 DIAGNOSIS — N189 Chronic kidney disease, unspecified: Secondary | ICD-10-CM

## 2019-04-25 NOTE — H&P (View-Only) (Signed)
REASON FOR CONSULT:    Hemodialysis access.  The consult is requested by Jannifer Hick, MD  ASSESSMENT & PLAN:   STAGE V CHRONIC KIDNEY DISEASE: Patient is right-handed.  Looks like his best option for a fistula would be a left brachiocephalic fistula.  I explained that if this is not adequate he could potentially require placement of an AV graft. I have explained the indications for placement of an AV fistula or AV graft. I've explained that if at all possible we will place an AV fistula.  I have reviewed the risks of placement of an AV fistula including but not limited to: failure of the fistula to mature, need for subsequent interventions, and thrombosis. In addition I have reviewed the potential complications of placement of an AV graft. These risks include, but are not limited to, graft thrombosis, graft infection, wound healing problems, bleeding, arm swelling, and steal syndrome. All the patient's questions were answered and they are agreeable to proceed with surgery.  The patient is HIV positive.  Deitra Mayo, MD, FACS Beeper [phone] Office: [phone]   HPI:   Darrell [name] is a pleasant 78 y.o. male, who was referred for evaluation for hemodialysis access.  The patient is right-handed.  He does not have a pacemaker.  He is not on any blood thinners.  He is not yet on dialysis.  He believes his end-stage renal disease is secondary to diabetes.  He denies any recent uremic symptoms.  Specifically, he denies nausea, vomiting, fatigue, anorexia, or palpitations.  I have reviewed the records from the referring office.  The patient has a history of stage V chronic kidney disease.  On 12/12/2018 creatinine was 3.25.  GFR was 14.  Past Medical History:  Diagnosis Date  . Anxiety   . Arthritis   . Cancer ([place])   . Cataract   . Chronic kidney disease   . Depression   . Diabetes mellitus without complication ([place])   . HIV (human immunodeficiency virus infection) ([place])   .  HIV infection ([place])   . Hyperlipidemia   . Hypertension   . Substance abuse ([place])     History reviewed. No pertinent family history.  SOCIAL HISTORY: Social History   Socioeconomic History  . Marital status: Single    Spouse name: Not on file  . Number of children: Not on file  . Years of education: Not on file  . Highest education level: Not on file  Occupational History  . Not on file  Social Needs  . Financial resource strain: Not on file  . Food insecurity    Worry: Not on file    Inability: Not on file  . Transportation needs    Medical: Not on file    Non-medical: Not on file  Tobacco Use  . Smoking status: Never Smoker  . Smokeless tobacco: Never Used  Substance and Sexual Activity  . Alcohol use: Not Currently    Frequency: Never  . Drug use: Never  . Sexual activity: Not on file  Lifestyle  . Physical activity    Days per week: Not on file    Minutes per session: Not on file  . Stress: Not on file  Relationships  . Social Herbalist on phone: Not on file    Gets together: Not on file    Attends religious service: Not on file    Active member of club or organization: Not on file    Attends meetings of clubs or  organizations: Not on file    Relationship status: Not on file  . Intimate partner violence    Fear of current or ex partner: Not on file    Emotionally abused: Not on file    Physically abused: Not on file    Forced sexual activity: Not on file  Other Topics Concern  . Not on file  Social History Narrative  . Not on file    Allergies  Allergen Reactions  . Percodan [Oxycodone-Aspirin] Other (See Comments)    Prickly feeling in hands, arms and torso    Current Outpatient Medications  Medication Sig Dispense Refill  . amLODipine (NORVASC) 10 MG tablet Take 10 mg by mouth daily.    Marland Kitchen atorvastatin (LIPITOR) 20 MG tablet TAKE ONE TABLET BY MOUTH EVERY DAY (Patient taking differently: Take 20 mg by mouth daily. ) 30 tablet 5  .  Calcium Carbonate (CALCIUM 600 PO) Take 600 mg by mouth daily.    . DULoxetine (CYMBALTA) 60 MG capsule Take 60 mg by mouth daily.    Marland Kitchen emtricitabine-rilpivir-tenofovir AF (ODEFSEY) 200-25-25 MG TABS tablet Take 1 tablet by mouth daily.    . fesoterodine (TOVIAZ) 8 MG TB24 tablet Take 8 mg by mouth daily.    . insulin NPH-regular Human (NOVOLIN 70/30) (70-30) 100 UNIT/ML injection Inject 100 Units into the skin every morning.     . Melatonin 5 MG TABS Take 5 mg by mouth at bedtime.    . mirabegron ER (MYRBETRIQ) 25 MG TB24 tablet Take 25 mg by mouth daily.    . MULTIPLE VITAMIN PO Take 1 tablet by mouth daily.    . Omega-3 Fatty Acids (FISH OIL) 1200 MG CAPS Take 1,200 mg by mouth 2 (two) times daily.     . psyllium (REGULOID) 0.52 g capsule Take 0.52 g by mouth daily. Fiber Laxative    . risperiDONE (RISPERDAL) 1 MG tablet Take 1 mg by mouth at bedtime.    . sodium bicarbonate 650 MG tablet Take 650 mg by mouth 2 (two) times daily.    . Tamsulosin HCl (FLOMAX) 0.4 MG CAPS TAKE ONE CAPSULE BY MOUTH EVERY DAY 30 capsule 6  . zolpidem (AMBIEN) 10 MG tablet Take 10 mg by mouth at bedtime as needed for sleep.     No current facility-administered medications for this visit.     REVIEW OF SYSTEMS:  [X]  denotes positive finding, [ ]  denotes negative finding Cardiac  Comments:  Chest pain or chest pressure:    Shortness of breath upon exertion:    Short of breath when lying flat:    Irregular heart rhythm:        Vascular    Pain in calf, thigh, or hip brought on by ambulation:    Pain in feet at night that wakes you up from your sleep:     Blood clot in your veins:    Leg swelling:  x       Pulmonary    Oxygen at home:    Productive cough:     Wheezing:         Neurologic    Sudden weakness in arms or legs:  x   Sudden numbness in arms or legs:  x   Sudden onset of difficulty speaking or slurred speech:    Temporary loss of vision in one eye:     Problems with dizziness:  x        Gastrointestinal    Blood in stool:  Vomited blood:         Genitourinary    Burning when urinating:     Blood in urine: x       Psychiatric    Major depression:  x       Hematologic    Bleeding problems:    Problems with blood clotting too easily:        Skin    Rashes or ulcers:        Constitutional    Fever or chills:     PHYSICAL EXAM:   Vitals:   04/25/19 1033  BP: 128/79  Pulse: 80  Resp: 20  Temp: 97.7 F (36.5 C)  SpO2: 98%  Weight: 229 lb 11.2 oz (104.2 kg)  Height: 5\' 9"  (1.753 m)    GENERAL: The patient is a well-nourished male, in no acute distress. The vital signs are documented above. CARDIAC: There is a regular rate and rhythm.  VASCULAR: I do not detect carotid bruits. He has palpable radial pulses bilaterally. He has no significant lower extremity swelling. PULMONARY: There is good air exchange bilaterally without wheezing or rales. ABDOMEN: Soft and non-tender with normal pitched bowel sounds.  MUSCULOSKELETAL: There are no major deformities or cyanosis. NEUROLOGIC: No focal weakness or paresthesias are detected. SKIN: There are no ulcers or rashes noted. PSYCHIATRIC: The patient has a normal affect.  DATA:    VEIN MAP: I have independently interpreted the patient's vein map.  On the right side the forearm and upper arm cephalic vein looks small.  The basilic vein looks marginal in size.  On the left side the forearm cephalic vein looks small.  The upper arm cephalic vein looks marginal in size.  The basilic vein looks marginal in size.  ARTERIAL DUPLEX: I have independently interpreted his arterial duplex scan.  On the right side there is a triphasic radial and ulnar waveform.  The brachial artery measures 0.49 cm in diameter.  On the left side there is a triphasic radial and ulnar waveform.  LABS: His GFR is 16.

## 2019-04-25 NOTE — Progress Notes (Signed)
REASON FOR CONSULT:    Hemodialysis access.  The consult is requested by Jannifer Hick, MD  ASSESSMENT & PLAN:   STAGE V CHRONIC KIDNEY DISEASE: Patient is right-handed.  Looks like his best option for a fistula would be a left brachiocephalic fistula.  I explained that if this is not adequate he could potentially require placement of an AV graft. I have explained the indications for placement of an AV fistula or AV graft. I've explained that if at all possible we will place an AV fistula.  I have reviewed the risks of placement of an AV fistula including but not limited to: failure of the fistula to mature, need for subsequent interventions, and thrombosis. In addition I have reviewed the potential complications of placement of an AV graft. These risks include, but are not limited to, graft thrombosis, graft infection, wound healing problems, bleeding, arm swelling, and steal syndrome. All the patient's questions were answered and they are agreeable to proceed with surgery.  The patient is HIV positive.  Darrell Mayo, MD, FACS Beeper [phone] Office: [phone]   HPI:   Darrell [name] is a pleasant 78 y.o. male, who was referred for evaluation for hemodialysis access.  The patient is right-handed.  He does not have a pacemaker.  He is not on any blood thinners.  He is not yet on dialysis.  He believes his end-stage renal disease is secondary to diabetes.  He denies any recent uremic symptoms.  Specifically, he denies nausea, vomiting, fatigue, anorexia, or palpitations.  I have reviewed the records from the referring office.  The patient has a history of stage V chronic kidney disease.  On 12/12/2018 creatinine was 3.25.  GFR was 14.  Past Medical History:  Diagnosis Date  . Anxiety   . Arthritis   . Cancer ([place])   . Cataract   . Chronic kidney disease   . Depression   . Diabetes mellitus without complication ([place])   . HIV (human immunodeficiency virus infection) ([place])   .  HIV infection ([place])   . Hyperlipidemia   . Hypertension   . Substance abuse ([place])     History reviewed. No pertinent family history.  SOCIAL HISTORY: Social History   Socioeconomic History  . Marital status: Single    Spouse name: Not on file  . Number of children: Not on file  . Years of education: Not on file  . Highest education level: Not on file  Occupational History  . Not on file  Social Needs  . Financial resource strain: Not on file  . Food insecurity    Worry: Not on file    Inability: Not on file  . Transportation needs    Medical: Not on file    Non-medical: Not on file  Tobacco Use  . Smoking status: Never Smoker  . Smokeless tobacco: Never Used  Substance and Sexual Activity  . Alcohol use: Not Currently    Frequency: Never  . Drug use: Never  . Sexual activity: Not on file  Lifestyle  . Physical activity    Days per week: Not on file    Minutes per session: Not on file  . Stress: Not on file  Relationships  . Social Herbalist on phone: Not on file    Gets together: Not on file    Attends religious service: Not on file    Active member of club or organization: Not on file    Attends meetings of clubs or  organizations: Not on file    Relationship status: Not on file  . Intimate partner violence    Fear of current or ex partner: Not on file    Emotionally abused: Not on file    Physically abused: Not on file    Forced sexual activity: Not on file  Other Topics Concern  . Not on file  Social History Narrative  . Not on file    Allergies  Allergen Reactions  . Percodan [Oxycodone-Aspirin] Other (See Comments)    Prickly feeling in hands, arms and torso    Current Outpatient Medications  Medication Sig Dispense Refill  . amLODipine (NORVASC) 10 MG tablet Take 10 mg by mouth daily.    Marland Kitchen atorvastatin (LIPITOR) 20 MG tablet TAKE ONE TABLET BY MOUTH EVERY DAY (Patient taking differently: Take 20 mg by mouth daily. ) 30 tablet 5  .  Calcium Carbonate (CALCIUM 600 PO) Take 600 mg by mouth daily.    . DULoxetine (CYMBALTA) 60 MG capsule Take 60 mg by mouth daily.    Marland Kitchen emtricitabine-rilpivir-tenofovir AF (ODEFSEY) 200-25-25 MG TABS tablet Take 1 tablet by mouth daily.    . fesoterodine (TOVIAZ) 8 MG TB24 tablet Take 8 mg by mouth daily.    . insulin NPH-regular Human (NOVOLIN 70/30) (70-30) 100 UNIT/ML injection Inject 100 Units into the skin every morning.     . Melatonin 5 MG TABS Take 5 mg by mouth at bedtime.    . mirabegron ER (MYRBETRIQ) 25 MG TB24 tablet Take 25 mg by mouth daily.    . MULTIPLE VITAMIN PO Take 1 tablet by mouth daily.    . Omega-3 Fatty Acids (FISH OIL) 1200 MG CAPS Take 1,200 mg by mouth 2 (two) times daily.     . psyllium (REGULOID) 0.52 g capsule Take 0.52 g by mouth daily. Fiber Laxative    . risperiDONE (RISPERDAL) 1 MG tablet Take 1 mg by mouth at bedtime.    . sodium bicarbonate 650 MG tablet Take 650 mg by mouth 2 (two) times daily.    . Tamsulosin HCl (FLOMAX) 0.4 MG CAPS TAKE ONE CAPSULE BY MOUTH EVERY DAY 30 capsule 6  . zolpidem (AMBIEN) 10 MG tablet Take 10 mg by mouth at bedtime as needed for sleep.     No current facility-administered medications for this visit.     REVIEW OF SYSTEMS:  [X]  denotes positive finding, [ ]  denotes negative finding Cardiac  Comments:  Chest pain or chest pressure:    Shortness of breath upon exertion:    Short of breath when lying flat:    Irregular heart rhythm:        Vascular    Pain in calf, thigh, or hip brought on by ambulation:    Pain in feet at night that wakes you up from your sleep:     Blood clot in your veins:    Leg swelling:  x       Pulmonary    Oxygen at home:    Productive cough:     Wheezing:         Neurologic    Sudden weakness in arms or legs:  x   Sudden numbness in arms or legs:  x   Sudden onset of difficulty speaking or slurred speech:    Temporary loss of vision in one eye:     Problems with dizziness:  x        Gastrointestinal    Blood in stool:  Vomited blood:         Genitourinary    Burning when urinating:     Blood in urine: x       Psychiatric    Major depression:  x       Hematologic    Bleeding problems:    Problems with blood clotting too easily:        Skin    Rashes or ulcers:        Constitutional    Fever or chills:     PHYSICAL EXAM:   Vitals:   04/25/19 1033  BP: 128/79  Pulse: 80  Resp: 20  Temp: 97.7 F (36.5 C)  SpO2: 98%  Weight: 229 lb 11.2 oz (104.2 kg)  Height: 5\' 9"  (1.753 m)    GENERAL: The patient is a well-nourished male, in no acute distress. The vital signs are documented above. CARDIAC: There is a regular rate and rhythm.  VASCULAR: I do not detect carotid bruits. He has palpable radial pulses bilaterally. He has no significant lower extremity swelling. PULMONARY: There is good air exchange bilaterally without wheezing or rales. ABDOMEN: Soft and non-tender with normal pitched bowel sounds.  MUSCULOSKELETAL: There are no major deformities or cyanosis. NEUROLOGIC: No focal weakness or paresthesias are detected. SKIN: There are no ulcers or rashes noted. PSYCHIATRIC: The patient has a normal affect.  DATA:    VEIN MAP: I have independently interpreted the patient's vein map.  On the right side the forearm and upper arm cephalic vein looks small.  The basilic vein looks marginal in size.  On the left side the forearm cephalic vein looks small.  The upper arm cephalic vein looks marginal in size.  The basilic vein looks marginal in size.  ARTERIAL DUPLEX: I have independently interpreted his arterial duplex scan.  On the right side there is a triphasic radial and ulnar waveform.  The brachial artery measures 0.49 cm in diameter.  On the left side there is a triphasic radial and ulnar waveform.  LABS: His GFR is 16.

## 2019-04-30 ENCOUNTER — Encounter (HOSPITAL_COMMUNITY): Payer: Self-pay | Admitting: *Deleted

## 2019-04-30 ENCOUNTER — Other Ambulatory Visit: Payer: Self-pay

## 2019-04-30 NOTE — Progress Notes (Signed)
Spoke with pt for pre-op call. Pt denies cardiac history. Pt is treated for HTN and Diabetes, type 2. Last A1C was 5.3 on 01/26/19 per Dr. Max Sane office. Pt states his fasting blood sugar is usually between 110-140. Pt instructed not to take his Novolin 70/30 in the AM. Instructed pt to check his blood sugar when he gets up and every 2 hours until he leaves for the hospital. If blood sugar is 70 or below, treat with 1/2 cup of clear juice (apple or cranberry) and recheck blood sugar 15 minutes after drinking juice. If blood sugar continues to be 70 or below, call the Short Stay department and ask to speak to a nurse.  Pt will have Covid Test done on arrival in the AM. He had transportation issues.   Coronavirus Screening  Have you experienced the following symptoms:  Cough  NO Fever (>100.43F)  NO Runny nose NO Sore throat NO Difficulty breathing/shortness of breath  NO  Have you or a family member traveled in the last 14 days and where? NO  Patient reminded that hospital visitation restrictions are in effect and the importance of the restrictions. Informed pt that he may 1 visitor come into the hospital and wait in the waiting room while he is getting ready for surgery and during surgery. He voiced understanding.

## 2019-05-01 ENCOUNTER — Encounter (HOSPITAL_COMMUNITY): Payer: Self-pay

## 2019-05-01 ENCOUNTER — Encounter (HOSPITAL_COMMUNITY)
Admission: RE | Disposition: A | Discharge status: Home / Self Care | Payer: Self-pay | Source: Home / Self Care | Attending: Vascular Surgery

## 2019-05-01 ENCOUNTER — Ambulatory Visit (HOSPITAL_COMMUNITY)
Admission: RE | Admit: 2019-05-01 | Discharge: 2019-05-01 | Disposition: A | Discharge status: Home / Self Care | Payer: Medicare HMO | Attending: Vascular Surgery | Admitting: Vascular Surgery

## 2019-05-01 ENCOUNTER — Ambulatory Visit (HOSPITAL_COMMUNITY): Payer: Medicare HMO | Admitting: Certified Registered"

## 2019-05-01 DIAGNOSIS — F419 Anxiety disorder, unspecified: Secondary | ICD-10-CM | POA: Insufficient documentation

## 2019-05-01 DIAGNOSIS — E1122 Type 2 diabetes mellitus with diabetic chronic kidney disease: Secondary | ICD-10-CM | POA: Insufficient documentation

## 2019-05-01 DIAGNOSIS — F329 Major depressive disorder, single episode, unspecified: Secondary | ICD-10-CM | POA: Insufficient documentation

## 2019-05-01 DIAGNOSIS — Z20828 Contact with and (suspected) exposure to other viral communicable diseases: Secondary | ICD-10-CM | POA: Diagnosis not present

## 2019-05-01 DIAGNOSIS — Z79899 Other long term (current) drug therapy: Secondary | ICD-10-CM | POA: Diagnosis not present

## 2019-05-01 DIAGNOSIS — N184 Chronic kidney disease, stage 4 (severe): Secondary | ICD-10-CM

## 2019-05-01 DIAGNOSIS — E785 Hyperlipidemia, unspecified: Secondary | ICD-10-CM | POA: Diagnosis not present

## 2019-05-01 DIAGNOSIS — Z21 Asymptomatic human immunodeficiency virus [HIV] infection status: Secondary | ICD-10-CM | POA: Insufficient documentation

## 2019-05-01 DIAGNOSIS — E1136 Type 2 diabetes mellitus with diabetic cataract: Secondary | ICD-10-CM | POA: Insufficient documentation

## 2019-05-01 DIAGNOSIS — Z6833 Body mass index (BMI) 33.0-33.9, adult: Secondary | ICD-10-CM | POA: Insufficient documentation

## 2019-05-01 DIAGNOSIS — N186 End stage renal disease: Secondary | ICD-10-CM | POA: Insufficient documentation

## 2019-05-01 DIAGNOSIS — I120 Hypertensive chronic kidney disease with stage 5 chronic kidney disease or end stage renal disease: Secondary | ICD-10-CM | POA: Insufficient documentation

## 2019-05-01 DIAGNOSIS — Z794 Long term (current) use of insulin: Secondary | ICD-10-CM | POA: Diagnosis not present

## 2019-05-01 HISTORY — PX: AV FISTULA PLACEMENT: SHX1204

## 2019-05-01 LAB — POCT I-STAT 4, (NA,K, GLUC, HGB,HCT)
Glucose, Bld: 142 mg/dL — ABNORMAL HIGH (ref 70–99)
HCT: 24 % — ABNORMAL LOW (ref 39.0–52.0)
Hemoglobin: 8.2 g/dL — ABNORMAL LOW (ref 13.0–17.0)
Potassium: 4.7 mmol/L (ref 3.5–5.1)
Sodium: 136 mmol/L (ref 135–145)

## 2019-05-01 LAB — SARS CORONAVIRUS 2 BY RT PCR (HOSPITAL ORDER, PERFORMED IN ~~LOC~~ HOSPITAL LAB): SARS Coronavirus 2: NEGATIVE

## 2019-05-01 LAB — GLUCOSE, CAPILLARY
Glucose-Capillary: 148 mg/dL — ABNORMAL HIGH (ref 70–99)
Glucose-Capillary: 142 mg/dL — ABNORMAL HIGH (ref 70–99)
Glucose-Capillary: 141 mg/dL — ABNORMAL HIGH (ref 70–99)

## 2019-05-01 SURGERY — ARTERIOVENOUS (AV) FISTULA CREATION
Anesthesia: Monitor Anesthesia Care | Laterality: Left

## 2019-05-01 MED ORDER — LIDOCAINE-EPINEPHRINE (PF) 1 %-1:200000 IJ SOLN
INTRAMUSCULAR | Status: AC
  Filled 2019-05-01: qty 30

## 2019-05-01 MED ORDER — HEPARIN SODIUM (PORCINE) 1000 UNIT/ML IJ SOLN
INTRAMUSCULAR | Status: AC
  Filled 2019-05-01: qty 1

## 2019-05-01 MED ORDER — ACETAMINOPHEN 160 MG/5ML PO SOLN: 1000.0000 mg | Freq: Once | ORAL | Status: DC | PRN

## 2019-05-01 MED ORDER — MIDAZOLAM HCL 5 MG/5ML IJ SOLN
INTRAMUSCULAR | Status: DC | PRN
  Administered 2019-05-01: 1 mg via INTRAVENOUS

## 2019-05-01 MED ORDER — MIDAZOLAM HCL 2 MG/2ML IJ SOLN
INTRAMUSCULAR | Status: AC
  Filled 2019-05-01: qty 2

## 2019-05-01 MED ORDER — PROTAMINE SULFATE 10 MG/ML IV SOLN
INTRAVENOUS | Status: AC
  Filled 2019-05-01: qty 5

## 2019-05-01 MED ORDER — SODIUM CHLORIDE 0.9 % IV SOLN
INTRAVENOUS | Status: DC | PRN
  Administered 2019-05-01: 500 mL

## 2019-05-01 MED ORDER — LIDOCAINE HCL (PF) 1 % IJ SOLN
INTRAMUSCULAR | Status: AC
  Filled 2019-05-01: qty 30

## 2019-05-01 MED ORDER — CEFAZOLIN SODIUM-DEXTROSE 2-4 GM/100ML-% IV SOLN
INTRAVENOUS | Status: AC
  Filled 2019-05-01: qty 100

## 2019-05-01 MED ORDER — HEPARIN SODIUM (PORCINE) 1000 UNIT/ML IJ SOLN
INTRAMUSCULAR | Status: DC | PRN
  Administered 2019-05-01: 9000 [IU] via INTRAVENOUS

## 2019-05-01 MED ORDER — ACETAMINOPHEN 500 MG PO TABS: 1000.0000 mg | ORAL_TABLET | Freq: Once | ORAL | Status: DC | PRN

## 2019-05-01 MED ORDER — PROPOFOL 500 MG/50ML IV EMUL
INTRAVENOUS | Status: DC | PRN
  Administered 2019-05-01: 75 ug/kg/min via INTRAVENOUS

## 2019-05-01 MED ORDER — SODIUM CHLORIDE 0.9 % IV SOLN
INTRAVENOUS | Status: DC
  Administered 2019-05-01 (×2): via INTRAVENOUS

## 2019-05-01 MED ORDER — CEFAZOLIN SODIUM-DEXTROSE 2-4 GM/100ML-% IV SOLN
2.0000 g | INTRAVENOUS | Status: AC
  Administered 2019-05-01: 12:00:00 2 g via INTRAVENOUS

## 2019-05-01 MED ORDER — FENTANYL CITRATE (PF) 250 MCG/5ML IJ SOLN
INTRAMUSCULAR | Status: AC
  Filled 2019-05-01: qty 5

## 2019-05-01 MED ORDER — FENTANYL CITRATE (PF) 100 MCG/2ML IJ SOLN: 25.0000 ug | INTRAMUSCULAR | Status: DC | PRN

## 2019-05-01 MED ORDER — LIDOCAINE 2% (20 MG/ML) 5 ML SYRINGE
INTRAMUSCULAR | Status: AC
  Filled 2019-05-01: qty 5

## 2019-05-01 MED ORDER — PROTAMINE SULFATE 10 MG/ML IV SOLN
INTRAVENOUS | Status: DC | PRN
  Administered 2019-05-01 (×2): 20 mg via INTRAVENOUS
  Administered 2019-05-01: 10 mg via INTRAVENOUS

## 2019-05-01 MED ORDER — STERILE WATER FOR IRRIGATION IR SOLN
Status: DC | PRN
  Administered 2019-05-01: 1000 mL

## 2019-05-01 MED ORDER — ACETAMINOPHEN 10 MG/ML IV SOLN: 1000.0000 mg | Freq: Once | INTRAVENOUS | Status: DC | PRN

## 2019-05-01 MED ORDER — HYDROCODONE-ACETAMINOPHEN 7.5-325 MG PO TABS: 1.0000 | ORAL_TABLET | Freq: Once | ORAL | Status: DC | PRN

## 2019-05-01 MED ORDER — TRAMADOL HCL 50 MG PO TABS: 50.0000 mg | ORAL_TABLET | Freq: Four times a day (QID) | ORAL | 0 refills | Status: DC | PRN

## 2019-05-01 MED ORDER — LIDOCAINE 2% (20 MG/ML) 5 ML SYRINGE
INTRAMUSCULAR | Status: DC | PRN
  Administered 2019-05-01: 60 mg via INTRAVENOUS

## 2019-05-01 MED ORDER — PROTAMINE SULFATE 10 MG/ML IV SOLN
INTRAVENOUS | Status: AC
  Filled 2019-05-01: qty 25

## 2019-05-01 MED ORDER — FENTANYL CITRATE (PF) 250 MCG/5ML IJ SOLN
INTRAMUSCULAR | Status: DC | PRN
  Administered 2019-05-01: 50 ug via INTRAVENOUS

## 2019-05-01 MED ORDER — 0.9 % SODIUM CHLORIDE (POUR BTL) OPTIME
TOPICAL | Status: DC | PRN
  Administered 2019-05-01: 1000 mL

## 2019-05-01 MED ORDER — LIDOCAINE-EPINEPHRINE (PF) 1 %-1:200000 IJ SOLN
INTRAMUSCULAR | Status: DC | PRN
  Administered 2019-05-01: 10 mL

## 2019-05-01 MED ORDER — SODIUM CHLORIDE 0.9 % IV SOLN
INTRAVENOUS | Status: AC
  Filled 2019-05-01: qty 1.2

## 2019-05-01 SURGICAL SUPPLY — 30 items
ARMBAND PINK RESTRICT EXTREMIT (MISCELLANEOUS) ×6 IMPLANT
CANISTER SUCT 3000ML PPV (MISCELLANEOUS) ×3 IMPLANT
CANNULA VESSEL 3MM 2 BLNT TIP (CANNULA) ×3 IMPLANT
CLIP VESOCCLUDE MED 6/CT (CLIP) ×3 IMPLANT
CLIP VESOCCLUDE SM WIDE 6/CT (CLIP) ×3 IMPLANT
COVER PROBE W GEL 5X96 (DRAPES) IMPLANT
COVER WAND RF STERILE (DRAPES) ×3 IMPLANT
DECANTER SPIKE VIAL GLASS SM (MISCELLANEOUS) ×3 IMPLANT
DERMABOND ADVANCED (GAUZE/BANDAGES/DRESSINGS) ×2
DERMABOND ADVANCED .7 DNX12 (GAUZE/BANDAGES/DRESSINGS) ×1 IMPLANT
ELECT REM PT RETURN 9FT ADLT (ELECTROSURGICAL) ×3
ELECTRODE REM PT RTRN 9FT ADLT (ELECTROSURGICAL) ×1 IMPLANT
GLOVE BIO SURGEON STRL SZ7.5 (GLOVE) ×3 IMPLANT
GLOVE BIOGEL PI IND STRL 8 (GLOVE) ×1 IMPLANT
GLOVE BIOGEL PI INDICATOR 8 (GLOVE) ×2
GOWN STRL REUS W/ TWL LRG LVL3 (GOWN DISPOSABLE) ×3 IMPLANT
GOWN STRL REUS W/TWL LRG LVL3 (GOWN DISPOSABLE) ×6
KIT BASIN OR (CUSTOM PROCEDURE TRAY) ×3 IMPLANT
KIT TURNOVER KIT B (KITS) ×3 IMPLANT
NS IRRIG 1000ML POUR BTL (IV SOLUTION) ×3 IMPLANT
PACK CV ACCESS (CUSTOM PROCEDURE TRAY) ×3 IMPLANT
PAD ARMBOARD 7.5X6 YLW CONV (MISCELLANEOUS) ×6 IMPLANT
SPONGE SURGIFOAM ABS GEL 100 (HEMOSTASIS) IMPLANT
SUT PROLENE 6 0 BV (SUTURE) ×6 IMPLANT
SUT VIC AB 3-0 SH 27 (SUTURE) ×2
SUT VIC AB 3-0 SH 27X BRD (SUTURE) ×1 IMPLANT
SUT VICRYL 4-0 PS2 18IN ABS (SUTURE) ×3 IMPLANT
TOWEL GREEN STERILE (TOWEL DISPOSABLE) ×3 IMPLANT
UNDERPAD 30X30 (UNDERPADS AND DIAPERS) ×3 IMPLANT
WATER STERILE IRR 1000ML POUR (IV SOLUTION) ×3 IMPLANT

## 2019-05-01 NOTE — Anesthesia Preprocedure Evaluation (Addendum)
Anesthesia Evaluation  Patient identified by MRN, date of birth, ID band Patient awake    Reviewed: Allergy & Precautions, NPO status , Patient's Chart, lab work & pertinent test results  History of Anesthesia Complications Negative for: history of anesthetic complications  Airway Mallampati: III  TM Distance: >3 FB Neck ROM: Full    Dental  (+) Dental Advisory Given, Partial Upper   Pulmonary neg recent URI,    breath sounds clear to auscultation       Cardiovascular hypertension, Pt. on medications (-) angina(-) Past MI and (-) CHF  Rhythm:Regular     Neuro/Psych PSYCHIATRIC DISORDERS Anxiety Depression negative neurological ROS     GI/Hepatic negative GI ROS, Neg liver ROS,   Endo/Other  diabetes, Insulin DependentMorbid obesity  Renal/GU CRFRenal disease     Musculoskeletal  (+) Arthritis ,   Abdominal   Peds  Hematology  (+) Blood dyscrasia, anemia , HIV,   Anesthesia Other Findings   Reproductive/Obstetrics                            Anesthesia Physical Anesthesia Plan  ASA: IV  Anesthesia Plan: MAC   Post-op Pain Management:    Induction: Intravenous  PONV Risk Score and Plan: 1 and Treatment may vary due to age or medical condition and Propofol infusion  Airway Management Planned: Nasal Cannula  Additional Equipment: None  Intra-op Plan:   Post-operative Plan:   Informed Consent: I have reviewed the patients History and Physical, chart, labs and discussed the procedure including the risks, benefits and alternatives for the proposed anesthesia with the patient or authorized representative who has indicated his/her understanding and acceptance.     Dental advisory given  Plan Discussed with: CRNA and Surgeon  Anesthesia Plan Comments:         Anesthesia Quick Evaluation

## 2019-05-01 NOTE — Interval H&P Note (Signed)
History and Physical Interval Note:  05/01/2019 11:06 AM  Darrell [name]  has presented today for surgery, with the diagnosis of chronic kidney disease.  The various methods of treatment have been discussed with the patient and family. After consideration of risks, benefits and other options for treatment, the patient has consented to  Procedure(s): ARTERIOVENOUS (AV) FISTULA CREATION VERSUS GRAFT PLACEMENT (Left) as a surgical intervention.  The patient's history has been reviewed, patient examined, no change in status, stable for surgery.  I have reviewed the patient's chart and labs.  Questions were answered to the patient's satisfaction.     Deitra Mayo

## 2019-05-01 NOTE — Op Note (Signed)
    NAME: Darrell [name]    MRN: [id number] DOB: 18-Mar-1941    DATE OF OPERATION: 05/01/2019  PREOP DIAGNOSIS:    Stage IV chronic kidney disease  POSTOP DIAGNOSIS:    Same  PROCEDURE:    Left brachiocephalic AV fistula  SURGEON: Judeth Cornfield. Scot Dock, MD, FACS  ASSIST: Linus Orn, SA  ANESTHESIA: Local with sedation  EBL: Minimal  INDICATIONS:    [name] is a 78 y.o. male who is not yet on dialysis.  He presents for new access.  FINDINGS:   4 mm upper arm cephalic vein  TECHNIQUE:   The patient was taken to the operating room and sedated by anesthesia.  The left upper extremity was prepped and draped in the usual sterile fashion.  After the skin was anesthetized with 1% lidocaine, a transverse incision was made just above the antecubital level after the skin was anesthetized.  The dissection was carried down to the brachial artery which was controlled with a vessel loop.  This had a good pulse and was soft without significant plaque.  The cephalic vein was dissected free.  It was ligated distally and and was an approximately 3.5 to 4 mm vein.  The patient was heparinized.  The brachial artery was clamped proximally and distally and a longitudinal arteriotomy was made.  The vein was spatulated and sewn end-to-side to the artery using continuous 6-0 Prolene suture.  At the completion there was a good thrill in the fistula.  There was a good radial signal with the Doppler.  The heparin was partially reversed with protamine.  The wound was closed with a deep layer of 3-0 Vicryl and the skin closed with 4-0 Vicryl.  Dermabond was applied.  The patient tolerated the procedure well and was transferred to the recovery room in stable condition.  All needle and sponge counts were correct.  Deitra Mayo, MD, FACS [facility]  DATE OF DICTATION:   05/01/2019

## 2019-05-01 NOTE — Transfer of Care (Signed)
Immediate Anesthesia Transfer of Care Note  Patient: Darrell [name]  Procedure(s) Performed: ARTERIOVENOUS (AV) FISTULA CREATION (Left )  Patient Location: PACU  Anesthesia Type:MAC  Level of Consciousness: awake, alert  and oriented  Airway & Oxygen Therapy: Patient Spontanous Breathing and Patient connected to face mask oxygen  Post-op Assessment: Report given to RN and Post -op Vital signs reviewed and stable  Post vital signs: Reviewed and stable  Last Vitals:  Vitals Value Taken Time  BP    Temp    Pulse 84 05/01/19 1248  Resp 18 05/01/19 1248  SpO2 100 % 05/01/19 1248  Vitals shown include unvalidated device data.  Last Pain:  Vitals:   05/01/19 0852  TempSrc:   PainSc: 0-No pain      Patients Stated Pain Goal: 2 (45/80/99 8338)  Complications: No apparent anesthesia complications

## 2019-05-02 ENCOUNTER — Encounter (HOSPITAL_COMMUNITY): Payer: Self-pay | Admitting: Vascular Surgery

## 2019-05-08 DIAGNOSIS — N401 Enlarged prostate with lower urinary tract symptoms: Secondary | ICD-10-CM | POA: Diagnosis not present

## 2019-05-08 DIAGNOSIS — M5416 Radiculopathy, lumbar region: Secondary | ICD-10-CM | POA: Diagnosis not present

## 2019-05-08 DIAGNOSIS — D696 Thrombocytopenia, unspecified: Secondary | ICD-10-CM | POA: Diagnosis not present

## 2019-05-08 DIAGNOSIS — F19982 Other psychoactive substance use, unspecified with psychoactive substance-induced sleep disorder: Secondary | ICD-10-CM | POA: Diagnosis not present

## 2019-05-08 DIAGNOSIS — R945 Abnormal results of liver function studies: Secondary | ICD-10-CM | POA: Diagnosis not present

## 2019-05-08 DIAGNOSIS — K5909 Other constipation: Secondary | ICD-10-CM | POA: Diagnosis not present

## 2019-05-08 DIAGNOSIS — B20 Human immunodeficiency virus [HIV] disease: Secondary | ICD-10-CM | POA: Diagnosis not present

## 2019-05-08 DIAGNOSIS — F200 Paranoid schizophrenia: Secondary | ICD-10-CM | POA: Diagnosis not present

## 2019-05-08 DIAGNOSIS — N184 Chronic kidney disease, stage 4 (severe): Secondary | ICD-10-CM | POA: Diagnosis not present

## 2019-05-09 NOTE — Anesthesia Postprocedure Evaluation (Signed)
Anesthesia Post Note  Patient: Darrell [name]  Procedure(s) Performed: ARTERIOVENOUS (AV) FISTULA CREATION (Left )     Patient location during evaluation: PACU Anesthesia Type: MAC Level of consciousness: awake and alert Pain management: pain level controlled Vital Signs Assessment: post-procedure vital signs reviewed and stable Respiratory status: spontaneous breathing, nonlabored ventilation, respiratory function stable and patient connected to nasal cannula oxygen Cardiovascular status: stable and blood pressure returned to baseline Postop Assessment: no apparent nausea or vomiting Anesthetic complications: no    Last Vitals:  Vitals:   05/01/19 1307 05/01/19 1318  BP: (!) 126/57 (!) 121/53  Pulse: 77 73  Resp: 14 13  Temp:  (!) 36.4 C  SpO2: 100% 100%    Last Pain:  Vitals:   05/01/19 1250  TempSrc:   PainSc: 0-No pain                 Macarthur Lorusso

## 2019-05-10 DIAGNOSIS — N189 Chronic kidney disease, unspecified: Secondary | ICD-10-CM | POA: Diagnosis not present

## 2019-05-10 DIAGNOSIS — B20 Human immunodeficiency virus [HIV] disease: Secondary | ICD-10-CM | POA: Diagnosis not present

## 2019-05-15 DIAGNOSIS — D696 Thrombocytopenia, unspecified: Secondary | ICD-10-CM | POA: Diagnosis not present

## 2019-05-15 DIAGNOSIS — F19982 Other psychoactive substance use, unspecified with psychoactive substance-induced sleep disorder: Secondary | ICD-10-CM | POA: Diagnosis not present

## 2019-05-15 DIAGNOSIS — N184 Chronic kidney disease, stage 4 (severe): Secondary | ICD-10-CM | POA: Diagnosis not present

## 2019-05-15 DIAGNOSIS — R945 Abnormal results of liver function studies: Secondary | ICD-10-CM | POA: Diagnosis not present

## 2019-05-15 DIAGNOSIS — N401 Enlarged prostate with lower urinary tract symptoms: Secondary | ICD-10-CM | POA: Diagnosis not present

## 2019-05-15 DIAGNOSIS — R351 Nocturia: Secondary | ICD-10-CM | POA: Diagnosis not present

## 2019-05-15 DIAGNOSIS — K5909 Other constipation: Secondary | ICD-10-CM | POA: Diagnosis not present

## 2019-05-15 DIAGNOSIS — B20 Human immunodeficiency virus [HIV] disease: Secondary | ICD-10-CM | POA: Diagnosis not present

## 2019-05-15 DIAGNOSIS — F200 Paranoid schizophrenia: Secondary | ICD-10-CM | POA: Diagnosis not present

## 2019-05-28 DIAGNOSIS — N185 Chronic kidney disease, stage 5: Secondary | ICD-10-CM | POA: Diagnosis not present

## 2019-05-28 DIAGNOSIS — D649 Anemia, unspecified: Secondary | ICD-10-CM

## 2019-05-28 DIAGNOSIS — Z21 Asymptomatic human immunodeficiency virus [HIV] infection status: Secondary | ICD-10-CM

## 2019-05-28 DIAGNOSIS — W19XXXA Unspecified fall, initial encounter: Secondary | ICD-10-CM | POA: Diagnosis not present

## 2019-05-28 DIAGNOSIS — I10 Essential (primary) hypertension: Secondary | ICD-10-CM | POA: Diagnosis not present

## 2019-05-28 DIAGNOSIS — R5381 Other malaise: Secondary | ICD-10-CM | POA: Diagnosis not present

## 2019-05-28 DIAGNOSIS — R29700 NIHSS score 0: Secondary | ICD-10-CM | POA: Diagnosis not present

## 2019-05-28 DIAGNOSIS — Z03818 Encounter for observation for suspected exposure to other biological agents ruled out: Secondary | ICD-10-CM | POA: Diagnosis not present

## 2019-05-28 DIAGNOSIS — M1990 Unspecified osteoarthritis, unspecified site: Secondary | ICD-10-CM | POA: Diagnosis not present

## 2019-05-28 DIAGNOSIS — R262 Difficulty in walking, not elsewhere classified: Secondary | ICD-10-CM | POA: Diagnosis not present

## 2019-05-28 DIAGNOSIS — N179 Acute kidney failure, unspecified: Secondary | ICD-10-CM | POA: Diagnosis not present

## 2019-05-28 DIAGNOSIS — E1165 Type 2 diabetes mellitus with hyperglycemia: Secondary | ICD-10-CM | POA: Diagnosis not present

## 2019-05-28 DIAGNOSIS — N400 Enlarged prostate without lower urinary tract symptoms: Secondary | ICD-10-CM | POA: Diagnosis not present

## 2019-05-28 DIAGNOSIS — D696 Thrombocytopenia, unspecified: Secondary | ICD-10-CM | POA: Diagnosis not present

## 2019-05-28 DIAGNOSIS — I129 Hypertensive chronic kidney disease with stage 1 through stage 4 chronic kidney disease, or unspecified chronic kidney disease: Secondary | ICD-10-CM | POA: Diagnosis not present

## 2019-05-28 DIAGNOSIS — N390 Urinary tract infection, site not specified: Secondary | ICD-10-CM

## 2019-05-28 DIAGNOSIS — R109 Unspecified abdominal pain: Secondary | ICD-10-CM

## 2019-05-28 DIAGNOSIS — Z9181 History of falling: Secondary | ICD-10-CM | POA: Diagnosis not present

## 2019-05-28 DIAGNOSIS — K5730 Diverticulosis of large intestine without perforation or abscess without bleeding: Secondary | ICD-10-CM | POA: Diagnosis not present

## 2019-05-28 DIAGNOSIS — I120 Hypertensive chronic kidney disease with stage 5 chronic kidney disease or end stage renal disease: Secondary | ICD-10-CM | POA: Diagnosis not present

## 2019-05-28 DIAGNOSIS — R55 Syncope and collapse: Secondary | ICD-10-CM | POA: Diagnosis not present

## 2019-05-28 DIAGNOSIS — E7800 Pure hypercholesterolemia, unspecified: Secondary | ICD-10-CM | POA: Diagnosis not present

## 2019-05-28 DIAGNOSIS — N189 Chronic kidney disease, unspecified: Secondary | ICD-10-CM | POA: Diagnosis not present

## 2019-05-28 DIAGNOSIS — Z79899 Other long term (current) drug therapy: Secondary | ICD-10-CM | POA: Diagnosis not present

## 2019-05-28 DIAGNOSIS — R531 Weakness: Secondary | ICD-10-CM | POA: Diagnosis not present

## 2019-05-29 DIAGNOSIS — R109 Unspecified abdominal pain: Secondary | ICD-10-CM | POA: Diagnosis not present

## 2019-05-29 DIAGNOSIS — R262 Difficulty in walking, not elsewhere classified: Secondary | ICD-10-CM | POA: Diagnosis not present

## 2019-05-29 DIAGNOSIS — N179 Acute kidney failure, unspecified: Secondary | ICD-10-CM | POA: Diagnosis not present

## 2019-05-29 DIAGNOSIS — Z21 Asymptomatic human immunodeficiency virus [HIV] infection status: Secondary | ICD-10-CM | POA: Diagnosis not present

## 2019-05-29 DIAGNOSIS — E1165 Type 2 diabetes mellitus with hyperglycemia: Secondary | ICD-10-CM | POA: Diagnosis not present

## 2019-05-30 DIAGNOSIS — N390 Urinary tract infection, site not specified: Secondary | ICD-10-CM | POA: Diagnosis not present

## 2019-05-30 DIAGNOSIS — Z741 Need for assistance with personal care: Secondary | ICD-10-CM | POA: Diagnosis not present

## 2019-05-30 DIAGNOSIS — M6281 Muscle weakness (generalized): Secondary | ICD-10-CM | POA: Diagnosis not present

## 2019-05-30 DIAGNOSIS — N400 Enlarged prostate without lower urinary tract symptoms: Secondary | ICD-10-CM | POA: Diagnosis not present

## 2019-05-30 DIAGNOSIS — N179 Acute kidney failure, unspecified: Secondary | ICD-10-CM | POA: Diagnosis not present

## 2019-05-30 DIAGNOSIS — D696 Thrombocytopenia, unspecified: Secondary | ICD-10-CM | POA: Diagnosis not present

## 2019-05-30 DIAGNOSIS — R109 Unspecified abdominal pain: Secondary | ICD-10-CM | POA: Diagnosis not present

## 2019-05-30 DIAGNOSIS — M2550 Pain in unspecified joint: Secondary | ICD-10-CM | POA: Diagnosis not present

## 2019-05-30 DIAGNOSIS — E1165 Type 2 diabetes mellitus with hyperglycemia: Secondary | ICD-10-CM | POA: Diagnosis not present

## 2019-05-30 DIAGNOSIS — N185 Chronic kidney disease, stage 5: Secondary | ICD-10-CM | POA: Diagnosis not present

## 2019-05-30 DIAGNOSIS — R262 Difficulty in walking, not elsewhere classified: Secondary | ICD-10-CM | POA: Diagnosis not present

## 2019-05-30 DIAGNOSIS — R2689 Other abnormalities of gait and mobility: Secondary | ICD-10-CM | POA: Diagnosis not present

## 2019-05-30 DIAGNOSIS — E7800 Pure hypercholesterolemia, unspecified: Secondary | ICD-10-CM | POA: Diagnosis not present

## 2019-05-30 DIAGNOSIS — I10 Essential (primary) hypertension: Secondary | ICD-10-CM | POA: Diagnosis not present

## 2019-05-30 DIAGNOSIS — R29700 NIHSS score 0: Secondary | ICD-10-CM | POA: Diagnosis not present

## 2019-05-30 DIAGNOSIS — R2681 Unsteadiness on feet: Secondary | ICD-10-CM | POA: Diagnosis not present

## 2019-05-30 DIAGNOSIS — M1990 Unspecified osteoarthritis, unspecified site: Secondary | ICD-10-CM | POA: Diagnosis not present

## 2019-05-30 DIAGNOSIS — Z21 Asymptomatic human immunodeficiency virus [HIV] infection status: Secondary | ICD-10-CM | POA: Diagnosis not present

## 2019-05-30 DIAGNOSIS — N189 Chronic kidney disease, unspecified: Secondary | ICD-10-CM | POA: Diagnosis not present

## 2019-05-30 DIAGNOSIS — I120 Hypertensive chronic kidney disease with stage 5 chronic kidney disease or end stage renal disease: Secondary | ICD-10-CM | POA: Diagnosis not present

## 2019-05-30 DIAGNOSIS — I700 Atherosclerosis of aorta: Secondary | ICD-10-CM | POA: Diagnosis not present

## 2019-05-30 DIAGNOSIS — Z79899 Other long term (current) drug therapy: Secondary | ICD-10-CM | POA: Diagnosis not present

## 2019-05-30 DIAGNOSIS — Z7401 Bed confinement status: Secondary | ICD-10-CM | POA: Diagnosis not present

## 2019-05-30 DIAGNOSIS — R531 Weakness: Secondary | ICD-10-CM | POA: Diagnosis not present

## 2019-05-30 DIAGNOSIS — R5381 Other malaise: Secondary | ICD-10-CM | POA: Diagnosis not present

## 2019-05-30 DIAGNOSIS — Z9181 History of falling: Secondary | ICD-10-CM | POA: Diagnosis not present

## 2019-06-01 DIAGNOSIS — E1165 Type 2 diabetes mellitus with hyperglycemia: Secondary | ICD-10-CM | POA: Diagnosis not present

## 2019-06-01 DIAGNOSIS — R262 Difficulty in walking, not elsewhere classified: Secondary | ICD-10-CM | POA: Diagnosis not present

## 2019-06-01 DIAGNOSIS — M2550 Pain in unspecified joint: Secondary | ICD-10-CM | POA: Diagnosis not present

## 2019-06-01 DIAGNOSIS — E1129 Type 2 diabetes mellitus with other diabetic kidney complication: Secondary | ICD-10-CM | POA: Diagnosis not present

## 2019-06-01 DIAGNOSIS — I10 Essential (primary) hypertension: Secondary | ICD-10-CM | POA: Diagnosis not present

## 2019-06-01 DIAGNOSIS — E1159 Type 2 diabetes mellitus with other circulatory complications: Secondary | ICD-10-CM | POA: Diagnosis not present

## 2019-06-01 DIAGNOSIS — N390 Urinary tract infection, site not specified: Secondary | ICD-10-CM | POA: Diagnosis not present

## 2019-06-01 DIAGNOSIS — M6281 Muscle weakness (generalized): Secondary | ICD-10-CM | POA: Diagnosis not present

## 2019-06-01 DIAGNOSIS — N189 Chronic kidney disease, unspecified: Secondary | ICD-10-CM | POA: Diagnosis not present

## 2019-06-01 DIAGNOSIS — I700 Atherosclerosis of aorta: Secondary | ICD-10-CM | POA: Diagnosis not present

## 2019-06-01 DIAGNOSIS — Z7401 Bed confinement status: Secondary | ICD-10-CM | POA: Diagnosis not present

## 2019-06-01 DIAGNOSIS — Z21 Asymptomatic human immunodeficiency virus [HIV] infection status: Secondary | ICD-10-CM | POA: Diagnosis not present

## 2019-06-01 DIAGNOSIS — Z741 Need for assistance with personal care: Secondary | ICD-10-CM | POA: Diagnosis not present

## 2019-06-01 DIAGNOSIS — R2689 Other abnormalities of gait and mobility: Secondary | ICD-10-CM | POA: Diagnosis not present

## 2019-06-01 DIAGNOSIS — N185 Chronic kidney disease, stage 5: Secondary | ICD-10-CM | POA: Diagnosis not present

## 2019-06-01 DIAGNOSIS — Z20828 Contact with and (suspected) exposure to other viral communicable diseases: Secondary | ICD-10-CM | POA: Diagnosis not present

## 2019-06-01 DIAGNOSIS — R531 Weakness: Secondary | ICD-10-CM | POA: Diagnosis not present

## 2019-06-01 DIAGNOSIS — E46 Unspecified protein-calorie malnutrition: Secondary | ICD-10-CM | POA: Diagnosis not present

## 2019-06-01 DIAGNOSIS — D696 Thrombocytopenia, unspecified: Secondary | ICD-10-CM | POA: Diagnosis not present

## 2019-06-01 DIAGNOSIS — R5381 Other malaise: Secondary | ICD-10-CM | POA: Diagnosis not present

## 2019-06-01 DIAGNOSIS — I120 Hypertensive chronic kidney disease with stage 5 chronic kidney disease or end stage renal disease: Secondary | ICD-10-CM | POA: Diagnosis not present

## 2019-06-01 DIAGNOSIS — R2681 Unsteadiness on feet: Secondary | ICD-10-CM | POA: Diagnosis not present

## 2019-06-01 DIAGNOSIS — N400 Enlarged prostate without lower urinary tract symptoms: Secondary | ICD-10-CM | POA: Diagnosis not present

## 2019-06-01 DIAGNOSIS — E1149 Type 2 diabetes mellitus with other diabetic neurological complication: Secondary | ICD-10-CM | POA: Diagnosis not present

## 2019-06-01 DIAGNOSIS — N179 Acute kidney failure, unspecified: Secondary | ICD-10-CM | POA: Diagnosis not present

## 2019-06-01 DIAGNOSIS — Z9181 History of falling: Secondary | ICD-10-CM | POA: Diagnosis not present

## 2019-06-01 DIAGNOSIS — R109 Unspecified abdominal pain: Secondary | ICD-10-CM | POA: Diagnosis not present

## 2019-06-08 DIAGNOSIS — R5381 Other malaise: Secondary | ICD-10-CM | POA: Diagnosis not present

## 2019-06-08 DIAGNOSIS — E1129 Type 2 diabetes mellitus with other diabetic kidney complication: Secondary | ICD-10-CM | POA: Diagnosis not present

## 2019-06-08 DIAGNOSIS — E1159 Type 2 diabetes mellitus with other circulatory complications: Secondary | ICD-10-CM | POA: Diagnosis not present

## 2019-06-08 DIAGNOSIS — N185 Chronic kidney disease, stage 5: Secondary | ICD-10-CM | POA: Diagnosis not present

## 2019-06-12 ENCOUNTER — Other Ambulatory Visit: Payer: Self-pay

## 2019-06-12 DIAGNOSIS — R5381 Other malaise: Secondary | ICD-10-CM | POA: Diagnosis not present

## 2019-06-12 DIAGNOSIS — Z21 Asymptomatic human immunodeficiency virus [HIV] infection status: Secondary | ICD-10-CM | POA: Diagnosis not present

## 2019-06-12 DIAGNOSIS — Z9181 History of falling: Secondary | ICD-10-CM | POA: Diagnosis not present

## 2019-06-12 DIAGNOSIS — N189 Chronic kidney disease, unspecified: Secondary | ICD-10-CM

## 2019-06-12 DIAGNOSIS — E46 Unspecified protein-calorie malnutrition: Secondary | ICD-10-CM | POA: Diagnosis not present

## 2019-06-13 ENCOUNTER — Ambulatory Visit (HOSPITAL_COMMUNITY): Payer: Medicare HMO | Attending: Vascular Surgery

## 2019-06-13 ENCOUNTER — Encounter: Payer: Medicare HMO | Admitting: Vascular Surgery

## 2019-06-15 DIAGNOSIS — R5381 Other malaise: Secondary | ICD-10-CM | POA: Diagnosis not present

## 2019-06-15 DIAGNOSIS — I700 Atherosclerosis of aorta: Secondary | ICD-10-CM | POA: Diagnosis not present

## 2019-06-15 DIAGNOSIS — E1129 Type 2 diabetes mellitus with other diabetic kidney complication: Secondary | ICD-10-CM | POA: Diagnosis not present

## 2019-06-15 DIAGNOSIS — E1149 Type 2 diabetes mellitus with other diabetic neurological complication: Secondary | ICD-10-CM | POA: Diagnosis not present

## 2019-06-20 ENCOUNTER — Other Ambulatory Visit: Payer: Self-pay

## 2019-06-20 NOTE — Patient Outreach (Signed)
[facility]) Care Management  06/20/2019  Darrell [name] 1941/05/01 [id number]     Referral received from [place]:  This [employer] member has been assigned to you as they have discharged from an inpatient admission from [facility] on 06/18/19. Please outreach and determine if there are any needs for transition. Patient is marked as pending until further notice.  Initial outreach unsuccessful. Will mail unsuccessful outreach letter and follow-up within 3-4 business days.  [place] Care Management [phone]

## 2019-06-21 DIAGNOSIS — N185 Chronic kidney disease, stage 5: Secondary | ICD-10-CM | POA: Diagnosis not present

## 2019-06-21 DIAGNOSIS — F209 Schizophrenia, unspecified: Secondary | ICD-10-CM | POA: Diagnosis not present

## 2019-06-21 DIAGNOSIS — F329 Major depressive disorder, single episode, unspecified: Secondary | ICD-10-CM | POA: Diagnosis not present

## 2019-06-21 DIAGNOSIS — E1165 Type 2 diabetes mellitus with hyperglycemia: Secondary | ICD-10-CM | POA: Diagnosis not present

## 2019-06-21 DIAGNOSIS — D631 Anemia in chronic kidney disease: Secondary | ICD-10-CM | POA: Diagnosis not present

## 2019-06-21 DIAGNOSIS — I129 Hypertensive chronic kidney disease with stage 1 through stage 4 chronic kidney disease, or unspecified chronic kidney disease: Secondary | ICD-10-CM | POA: Diagnosis not present

## 2019-06-21 DIAGNOSIS — F419 Anxiety disorder, unspecified: Secondary | ICD-10-CM | POA: Diagnosis not present

## 2019-06-21 DIAGNOSIS — E1122 Type 2 diabetes mellitus with diabetic chronic kidney disease: Secondary | ICD-10-CM | POA: Diagnosis not present

## 2019-06-21 DIAGNOSIS — N3001 Acute cystitis with hematuria: Secondary | ICD-10-CM | POA: Diagnosis not present

## 2019-06-22 DIAGNOSIS — N3001 Acute cystitis with hematuria: Secondary | ICD-10-CM | POA: Diagnosis not present

## 2019-06-22 DIAGNOSIS — F209 Schizophrenia, unspecified: Secondary | ICD-10-CM | POA: Diagnosis not present

## 2019-06-22 DIAGNOSIS — F419 Anxiety disorder, unspecified: Secondary | ICD-10-CM | POA: Diagnosis not present

## 2019-06-22 DIAGNOSIS — F329 Major depressive disorder, single episode, unspecified: Secondary | ICD-10-CM | POA: Diagnosis not present

## 2019-06-22 DIAGNOSIS — I129 Hypertensive chronic kidney disease with stage 1 through stage 4 chronic kidney disease, or unspecified chronic kidney disease: Secondary | ICD-10-CM | POA: Diagnosis not present

## 2019-06-22 DIAGNOSIS — D631 Anemia in chronic kidney disease: Secondary | ICD-10-CM | POA: Diagnosis not present

## 2019-06-22 DIAGNOSIS — N185 Chronic kidney disease, stage 5: Secondary | ICD-10-CM | POA: Diagnosis not present

## 2019-06-22 DIAGNOSIS — E1165 Type 2 diabetes mellitus with hyperglycemia: Secondary | ICD-10-CM | POA: Diagnosis not present

## 2019-06-22 DIAGNOSIS — E1122 Type 2 diabetes mellitus with diabetic chronic kidney disease: Secondary | ICD-10-CM | POA: Diagnosis not present

## 2019-06-26 ENCOUNTER — Other Ambulatory Visit: Payer: Self-pay

## 2019-06-26 NOTE — Patient Outreach (Signed)
[facility]) Care Management  06/26/2019  Cane Dubray 1941-03-15 [id number]    2nd unsuccessful outreach attempt. Will follow-up within 3-4 business days.     [place] Care Management [phone]

## 2019-06-27 DIAGNOSIS — R945 Abnormal results of liver function studies: Secondary | ICD-10-CM | POA: Diagnosis not present

## 2019-06-27 DIAGNOSIS — I770 Arteriovenous fistula, acquired: Secondary | ICD-10-CM | POA: Diagnosis not present

## 2019-06-27 DIAGNOSIS — Z23 Encounter for immunization: Secondary | ICD-10-CM | POA: Diagnosis not present

## 2019-06-27 DIAGNOSIS — F200 Paranoid schizophrenia: Secondary | ICD-10-CM | POA: Diagnosis not present

## 2019-06-27 DIAGNOSIS — K5909 Other constipation: Secondary | ICD-10-CM | POA: Diagnosis not present

## 2019-06-27 DIAGNOSIS — B20 Human immunodeficiency virus [HIV] disease: Secondary | ICD-10-CM | POA: Diagnosis not present

## 2019-06-27 DIAGNOSIS — E1122 Type 2 diabetes mellitus with diabetic chronic kidney disease: Secondary | ICD-10-CM | POA: Diagnosis not present

## 2019-06-27 DIAGNOSIS — E109 Type 1 diabetes mellitus without complications: Secondary | ICD-10-CM | POA: Diagnosis not present

## 2019-06-27 DIAGNOSIS — E872 Acidosis: Secondary | ICD-10-CM | POA: Diagnosis not present

## 2019-06-27 DIAGNOSIS — D649 Anemia, unspecified: Secondary | ICD-10-CM | POA: Diagnosis not present

## 2019-06-27 DIAGNOSIS — N184 Chronic kidney disease, stage 4 (severe): Secondary | ICD-10-CM | POA: Diagnosis not present

## 2019-06-27 DIAGNOSIS — I129 Hypertensive chronic kidney disease with stage 1 through stage 4 chronic kidney disease, or unspecified chronic kidney disease: Secondary | ICD-10-CM | POA: Diagnosis not present

## 2019-06-27 DIAGNOSIS — D696 Thrombocytopenia, unspecified: Secondary | ICD-10-CM | POA: Diagnosis not present

## 2019-06-27 DIAGNOSIS — N401 Enlarged prostate with lower urinary tract symptoms: Secondary | ICD-10-CM | POA: Diagnosis not present

## 2019-06-27 DIAGNOSIS — E1159 Type 2 diabetes mellitus with other circulatory complications: Secondary | ICD-10-CM | POA: Diagnosis not present

## 2019-06-27 DIAGNOSIS — R351 Nocturia: Secondary | ICD-10-CM | POA: Diagnosis not present

## 2019-06-27 DIAGNOSIS — Z8546 Personal history of malignant neoplasm of prostate: Secondary | ICD-10-CM | POA: Diagnosis not present

## 2019-06-27 DIAGNOSIS — F19982 Other psychoactive substance use, unspecified with psychoactive substance-induced sleep disorder: Secondary | ICD-10-CM | POA: Diagnosis not present

## 2019-06-27 DIAGNOSIS — R809 Proteinuria, unspecified: Secondary | ICD-10-CM | POA: Diagnosis not present

## 2019-06-28 DIAGNOSIS — N3001 Acute cystitis with hematuria: Secondary | ICD-10-CM | POA: Diagnosis not present

## 2019-06-28 DIAGNOSIS — F209 Schizophrenia, unspecified: Secondary | ICD-10-CM | POA: Diagnosis not present

## 2019-06-28 DIAGNOSIS — Z9181 History of falling: Secondary | ICD-10-CM | POA: Diagnosis not present

## 2019-06-28 DIAGNOSIS — I672 Cerebral atherosclerosis: Secondary | ICD-10-CM | POA: Diagnosis not present

## 2019-06-28 DIAGNOSIS — I129 Hypertensive chronic kidney disease with stage 1 through stage 4 chronic kidney disease, or unspecified chronic kidney disease: Secondary | ICD-10-CM | POA: Diagnosis not present

## 2019-06-28 DIAGNOSIS — D631 Anemia in chronic kidney disease: Secondary | ICD-10-CM | POA: Diagnosis not present

## 2019-06-28 DIAGNOSIS — E1165 Type 2 diabetes mellitus with hyperglycemia: Secondary | ICD-10-CM | POA: Diagnosis not present

## 2019-06-28 DIAGNOSIS — I700 Atherosclerosis of aorta: Secondary | ICD-10-CM | POA: Diagnosis not present

## 2019-06-28 DIAGNOSIS — F419 Anxiety disorder, unspecified: Secondary | ICD-10-CM | POA: Diagnosis not present

## 2019-06-28 DIAGNOSIS — M1991 Primary osteoarthritis, unspecified site: Secondary | ICD-10-CM | POA: Diagnosis not present

## 2019-06-28 DIAGNOSIS — W19XXXD Unspecified fall, subsequent encounter: Secondary | ICD-10-CM | POA: Diagnosis not present

## 2019-06-28 DIAGNOSIS — Z9842 Cataract extraction status, left eye: Secondary | ICD-10-CM | POA: Diagnosis not present

## 2019-06-28 DIAGNOSIS — E1122 Type 2 diabetes mellitus with diabetic chronic kidney disease: Secondary | ICD-10-CM | POA: Diagnosis not present

## 2019-06-28 DIAGNOSIS — I864 Gastric varices: Secondary | ICD-10-CM | POA: Diagnosis not present

## 2019-06-28 DIAGNOSIS — G319 Degenerative disease of nervous system, unspecified: Secondary | ICD-10-CM | POA: Diagnosis not present

## 2019-06-28 DIAGNOSIS — G8929 Other chronic pain: Secondary | ICD-10-CM | POA: Diagnosis not present

## 2019-06-28 DIAGNOSIS — Z96653 Presence of artificial knee joint, bilateral: Secondary | ICD-10-CM | POA: Diagnosis not present

## 2019-06-28 DIAGNOSIS — E7800 Pure hypercholesterolemia, unspecified: Secondary | ICD-10-CM | POA: Diagnosis not present

## 2019-06-28 DIAGNOSIS — M545 Low back pain: Secondary | ICD-10-CM | POA: Diagnosis not present

## 2019-06-28 DIAGNOSIS — N185 Chronic kidney disease, stage 5: Secondary | ICD-10-CM | POA: Diagnosis not present

## 2019-06-28 DIAGNOSIS — Z9841 Cataract extraction status, right eye: Secondary | ICD-10-CM | POA: Diagnosis not present

## 2019-06-28 DIAGNOSIS — F329 Major depressive disorder, single episode, unspecified: Secondary | ICD-10-CM | POA: Diagnosis not present

## 2019-06-29 ENCOUNTER — Other Ambulatory Visit: Payer: Self-pay

## 2019-06-29 NOTE — Patient Outreach (Signed)
[facility]) Care Management  06/29/2019  Darrell [name] 1941-05-20 [id number]    3rd unsuccessful outreach attempt. Case closure pending.    [place] Care Management [phone]

## 2019-07-03 DIAGNOSIS — M1991 Primary osteoarthritis, unspecified site: Secondary | ICD-10-CM | POA: Diagnosis not present

## 2019-07-03 DIAGNOSIS — G319 Degenerative disease of nervous system, unspecified: Secondary | ICD-10-CM | POA: Diagnosis not present

## 2019-07-03 DIAGNOSIS — M545 Low back pain: Secondary | ICD-10-CM | POA: Diagnosis not present

## 2019-07-03 DIAGNOSIS — I700 Atherosclerosis of aorta: Secondary | ICD-10-CM | POA: Diagnosis not present

## 2019-07-03 DIAGNOSIS — E1122 Type 2 diabetes mellitus with diabetic chronic kidney disease: Secondary | ICD-10-CM | POA: Diagnosis not present

## 2019-07-03 DIAGNOSIS — D631 Anemia in chronic kidney disease: Secondary | ICD-10-CM | POA: Diagnosis not present

## 2019-07-03 DIAGNOSIS — I129 Hypertensive chronic kidney disease with stage 1 through stage 4 chronic kidney disease, or unspecified chronic kidney disease: Secondary | ICD-10-CM | POA: Diagnosis not present

## 2019-07-03 DIAGNOSIS — G8929 Other chronic pain: Secondary | ICD-10-CM | POA: Diagnosis not present

## 2019-07-03 DIAGNOSIS — W19XXXD Unspecified fall, subsequent encounter: Secondary | ICD-10-CM | POA: Diagnosis not present

## 2019-07-03 DIAGNOSIS — F419 Anxiety disorder, unspecified: Secondary | ICD-10-CM | POA: Diagnosis not present

## 2019-07-03 DIAGNOSIS — Z96653 Presence of artificial knee joint, bilateral: Secondary | ICD-10-CM | POA: Diagnosis not present

## 2019-07-03 DIAGNOSIS — Z9842 Cataract extraction status, left eye: Secondary | ICD-10-CM | POA: Diagnosis not present

## 2019-07-03 DIAGNOSIS — N185 Chronic kidney disease, stage 5: Secondary | ICD-10-CM | POA: Diagnosis not present

## 2019-07-03 DIAGNOSIS — I864 Gastric varices: Secondary | ICD-10-CM | POA: Diagnosis not present

## 2019-07-03 DIAGNOSIS — Z9181 History of falling: Secondary | ICD-10-CM | POA: Diagnosis not present

## 2019-07-03 DIAGNOSIS — I672 Cerebral atherosclerosis: Secondary | ICD-10-CM | POA: Diagnosis not present

## 2019-07-03 DIAGNOSIS — N3001 Acute cystitis with hematuria: Secondary | ICD-10-CM | POA: Diagnosis not present

## 2019-07-03 DIAGNOSIS — F209 Schizophrenia, unspecified: Secondary | ICD-10-CM | POA: Diagnosis not present

## 2019-07-03 DIAGNOSIS — E1165 Type 2 diabetes mellitus with hyperglycemia: Secondary | ICD-10-CM | POA: Diagnosis not present

## 2019-07-03 DIAGNOSIS — F329 Major depressive disorder, single episode, unspecified: Secondary | ICD-10-CM | POA: Diagnosis not present

## 2019-07-03 DIAGNOSIS — Z9841 Cataract extraction status, right eye: Secondary | ICD-10-CM | POA: Diagnosis not present

## 2019-07-03 DIAGNOSIS — E7800 Pure hypercholesterolemia, unspecified: Secondary | ICD-10-CM | POA: Diagnosis not present

## 2019-07-04 DIAGNOSIS — D631 Anemia in chronic kidney disease: Secondary | ICD-10-CM | POA: Diagnosis not present

## 2019-07-04 DIAGNOSIS — I129 Hypertensive chronic kidney disease with stage 1 through stage 4 chronic kidney disease, or unspecified chronic kidney disease: Secondary | ICD-10-CM | POA: Diagnosis not present

## 2019-07-04 DIAGNOSIS — F419 Anxiety disorder, unspecified: Secondary | ICD-10-CM | POA: Diagnosis not present

## 2019-07-04 DIAGNOSIS — F329 Major depressive disorder, single episode, unspecified: Secondary | ICD-10-CM | POA: Diagnosis not present

## 2019-07-04 DIAGNOSIS — E7800 Pure hypercholesterolemia, unspecified: Secondary | ICD-10-CM | POA: Diagnosis not present

## 2019-07-04 DIAGNOSIS — M1991 Primary osteoarthritis, unspecified site: Secondary | ICD-10-CM | POA: Diagnosis not present

## 2019-07-04 DIAGNOSIS — I864 Gastric varices: Secondary | ICD-10-CM | POA: Diagnosis not present

## 2019-07-04 DIAGNOSIS — Z96653 Presence of artificial knee joint, bilateral: Secondary | ICD-10-CM | POA: Diagnosis not present

## 2019-07-04 DIAGNOSIS — W19XXXD Unspecified fall, subsequent encounter: Secondary | ICD-10-CM | POA: Diagnosis not present

## 2019-07-04 DIAGNOSIS — Z9181 History of falling: Secondary | ICD-10-CM | POA: Diagnosis not present

## 2019-07-04 DIAGNOSIS — Z9841 Cataract extraction status, right eye: Secondary | ICD-10-CM | POA: Diagnosis not present

## 2019-07-04 DIAGNOSIS — I700 Atherosclerosis of aorta: Secondary | ICD-10-CM | POA: Diagnosis not present

## 2019-07-04 DIAGNOSIS — N3001 Acute cystitis with hematuria: Secondary | ICD-10-CM | POA: Diagnosis not present

## 2019-07-04 DIAGNOSIS — N185 Chronic kidney disease, stage 5: Secondary | ICD-10-CM | POA: Diagnosis not present

## 2019-07-04 DIAGNOSIS — E1122 Type 2 diabetes mellitus with diabetic chronic kidney disease: Secondary | ICD-10-CM | POA: Diagnosis not present

## 2019-07-04 DIAGNOSIS — M545 Low back pain: Secondary | ICD-10-CM | POA: Diagnosis not present

## 2019-07-04 DIAGNOSIS — F209 Schizophrenia, unspecified: Secondary | ICD-10-CM | POA: Diagnosis not present

## 2019-07-04 DIAGNOSIS — G8929 Other chronic pain: Secondary | ICD-10-CM | POA: Diagnosis not present

## 2019-07-04 DIAGNOSIS — Z9842 Cataract extraction status, left eye: Secondary | ICD-10-CM | POA: Diagnosis not present

## 2019-07-04 DIAGNOSIS — E1165 Type 2 diabetes mellitus with hyperglycemia: Secondary | ICD-10-CM | POA: Diagnosis not present

## 2019-07-04 DIAGNOSIS — I672 Cerebral atherosclerosis: Secondary | ICD-10-CM | POA: Diagnosis not present

## 2019-07-04 DIAGNOSIS — G319 Degenerative disease of nervous system, unspecified: Secondary | ICD-10-CM | POA: Diagnosis not present

## 2019-07-05 DIAGNOSIS — F419 Anxiety disorder, unspecified: Secondary | ICD-10-CM | POA: Diagnosis not present

## 2019-07-05 DIAGNOSIS — F329 Major depressive disorder, single episode, unspecified: Secondary | ICD-10-CM | POA: Diagnosis not present

## 2019-07-05 DIAGNOSIS — I864 Gastric varices: Secondary | ICD-10-CM | POA: Diagnosis not present

## 2019-07-05 DIAGNOSIS — I700 Atherosclerosis of aorta: Secondary | ICD-10-CM | POA: Diagnosis not present

## 2019-07-05 DIAGNOSIS — Z9842 Cataract extraction status, left eye: Secondary | ICD-10-CM | POA: Diagnosis not present

## 2019-07-05 DIAGNOSIS — G319 Degenerative disease of nervous system, unspecified: Secondary | ICD-10-CM | POA: Diagnosis not present

## 2019-07-05 DIAGNOSIS — E1165 Type 2 diabetes mellitus with hyperglycemia: Secondary | ICD-10-CM | POA: Diagnosis not present

## 2019-07-05 DIAGNOSIS — M545 Low back pain: Secondary | ICD-10-CM | POA: Diagnosis not present

## 2019-07-05 DIAGNOSIS — N185 Chronic kidney disease, stage 5: Secondary | ICD-10-CM | POA: Diagnosis not present

## 2019-07-05 DIAGNOSIS — M1991 Primary osteoarthritis, unspecified site: Secondary | ICD-10-CM | POA: Diagnosis not present

## 2019-07-05 DIAGNOSIS — Z9181 History of falling: Secondary | ICD-10-CM | POA: Diagnosis not present

## 2019-07-05 DIAGNOSIS — I129 Hypertensive chronic kidney disease with stage 1 through stage 4 chronic kidney disease, or unspecified chronic kidney disease: Secondary | ICD-10-CM | POA: Diagnosis not present

## 2019-07-05 DIAGNOSIS — N3001 Acute cystitis with hematuria: Secondary | ICD-10-CM | POA: Diagnosis not present

## 2019-07-05 DIAGNOSIS — E1122 Type 2 diabetes mellitus with diabetic chronic kidney disease: Secondary | ICD-10-CM | POA: Diagnosis not present

## 2019-07-05 DIAGNOSIS — Z96653 Presence of artificial knee joint, bilateral: Secondary | ICD-10-CM | POA: Diagnosis not present

## 2019-07-05 DIAGNOSIS — I672 Cerebral atherosclerosis: Secondary | ICD-10-CM | POA: Diagnosis not present

## 2019-07-05 DIAGNOSIS — W19XXXD Unspecified fall, subsequent encounter: Secondary | ICD-10-CM | POA: Diagnosis not present

## 2019-07-05 DIAGNOSIS — G8929 Other chronic pain: Secondary | ICD-10-CM | POA: Diagnosis not present

## 2019-07-05 DIAGNOSIS — Z9841 Cataract extraction status, right eye: Secondary | ICD-10-CM | POA: Diagnosis not present

## 2019-07-05 DIAGNOSIS — E7800 Pure hypercholesterolemia, unspecified: Secondary | ICD-10-CM | POA: Diagnosis not present

## 2019-07-05 DIAGNOSIS — D631 Anemia in chronic kidney disease: Secondary | ICD-10-CM | POA: Diagnosis not present

## 2019-07-05 DIAGNOSIS — F209 Schizophrenia, unspecified: Secondary | ICD-10-CM | POA: Diagnosis not present

## 2019-07-06 DIAGNOSIS — Z9842 Cataract extraction status, left eye: Secondary | ICD-10-CM | POA: Diagnosis not present

## 2019-07-06 DIAGNOSIS — D631 Anemia in chronic kidney disease: Secondary | ICD-10-CM | POA: Diagnosis not present

## 2019-07-06 DIAGNOSIS — F329 Major depressive disorder, single episode, unspecified: Secondary | ICD-10-CM | POA: Diagnosis not present

## 2019-07-06 DIAGNOSIS — I700 Atherosclerosis of aorta: Secondary | ICD-10-CM | POA: Diagnosis not present

## 2019-07-06 DIAGNOSIS — E1165 Type 2 diabetes mellitus with hyperglycemia: Secondary | ICD-10-CM | POA: Diagnosis not present

## 2019-07-06 DIAGNOSIS — G319 Degenerative disease of nervous system, unspecified: Secondary | ICD-10-CM | POA: Diagnosis not present

## 2019-07-06 DIAGNOSIS — N185 Chronic kidney disease, stage 5: Secondary | ICD-10-CM | POA: Diagnosis not present

## 2019-07-06 DIAGNOSIS — F419 Anxiety disorder, unspecified: Secondary | ICD-10-CM | POA: Diagnosis not present

## 2019-07-06 DIAGNOSIS — E1122 Type 2 diabetes mellitus with diabetic chronic kidney disease: Secondary | ICD-10-CM | POA: Diagnosis not present

## 2019-07-06 DIAGNOSIS — I129 Hypertensive chronic kidney disease with stage 1 through stage 4 chronic kidney disease, or unspecified chronic kidney disease: Secondary | ICD-10-CM | POA: Diagnosis not present

## 2019-07-06 DIAGNOSIS — Z9181 History of falling: Secondary | ICD-10-CM | POA: Diagnosis not present

## 2019-07-06 DIAGNOSIS — W19XXXD Unspecified fall, subsequent encounter: Secondary | ICD-10-CM | POA: Diagnosis not present

## 2019-07-06 DIAGNOSIS — E7800 Pure hypercholesterolemia, unspecified: Secondary | ICD-10-CM | POA: Diagnosis not present

## 2019-07-06 DIAGNOSIS — I672 Cerebral atherosclerosis: Secondary | ICD-10-CM | POA: Diagnosis not present

## 2019-07-06 DIAGNOSIS — I864 Gastric varices: Secondary | ICD-10-CM | POA: Diagnosis not present

## 2019-07-06 DIAGNOSIS — M1991 Primary osteoarthritis, unspecified site: Secondary | ICD-10-CM | POA: Diagnosis not present

## 2019-07-06 DIAGNOSIS — Z96653 Presence of artificial knee joint, bilateral: Secondary | ICD-10-CM | POA: Diagnosis not present

## 2019-07-06 DIAGNOSIS — F209 Schizophrenia, unspecified: Secondary | ICD-10-CM | POA: Diagnosis not present

## 2019-07-06 DIAGNOSIS — G8929 Other chronic pain: Secondary | ICD-10-CM | POA: Diagnosis not present

## 2019-07-06 DIAGNOSIS — M545 Low back pain: Secondary | ICD-10-CM | POA: Diagnosis not present

## 2019-07-06 DIAGNOSIS — N3001 Acute cystitis with hematuria: Secondary | ICD-10-CM | POA: Diagnosis not present

## 2019-07-06 DIAGNOSIS — Z9841 Cataract extraction status, right eye: Secondary | ICD-10-CM | POA: Diagnosis not present

## 2019-07-09 DIAGNOSIS — R945 Abnormal results of liver function studies: Secondary | ICD-10-CM | POA: Diagnosis not present

## 2019-07-09 DIAGNOSIS — D696 Thrombocytopenia, unspecified: Secondary | ICD-10-CM | POA: Diagnosis not present

## 2019-07-10 DIAGNOSIS — G319 Degenerative disease of nervous system, unspecified: Secondary | ICD-10-CM | POA: Diagnosis not present

## 2019-07-10 DIAGNOSIS — W19XXXD Unspecified fall, subsequent encounter: Secondary | ICD-10-CM | POA: Diagnosis not present

## 2019-07-10 DIAGNOSIS — G8929 Other chronic pain: Secondary | ICD-10-CM | POA: Diagnosis not present

## 2019-07-10 DIAGNOSIS — M1991 Primary osteoarthritis, unspecified site: Secondary | ICD-10-CM | POA: Diagnosis not present

## 2019-07-10 DIAGNOSIS — I700 Atherosclerosis of aorta: Secondary | ICD-10-CM | POA: Diagnosis not present

## 2019-07-10 DIAGNOSIS — D631 Anemia in chronic kidney disease: Secondary | ICD-10-CM | POA: Diagnosis not present

## 2019-07-10 DIAGNOSIS — F209 Schizophrenia, unspecified: Secondary | ICD-10-CM | POA: Diagnosis not present

## 2019-07-10 DIAGNOSIS — E7800 Pure hypercholesterolemia, unspecified: Secondary | ICD-10-CM | POA: Diagnosis not present

## 2019-07-10 DIAGNOSIS — Z9842 Cataract extraction status, left eye: Secondary | ICD-10-CM | POA: Diagnosis not present

## 2019-07-10 DIAGNOSIS — E1165 Type 2 diabetes mellitus with hyperglycemia: Secondary | ICD-10-CM | POA: Diagnosis not present

## 2019-07-10 DIAGNOSIS — Z96653 Presence of artificial knee joint, bilateral: Secondary | ICD-10-CM | POA: Diagnosis not present

## 2019-07-10 DIAGNOSIS — I129 Hypertensive chronic kidney disease with stage 1 through stage 4 chronic kidney disease, or unspecified chronic kidney disease: Secondary | ICD-10-CM | POA: Diagnosis not present

## 2019-07-10 DIAGNOSIS — Z9181 History of falling: Secondary | ICD-10-CM | POA: Diagnosis not present

## 2019-07-10 DIAGNOSIS — N185 Chronic kidney disease, stage 5: Secondary | ICD-10-CM | POA: Diagnosis not present

## 2019-07-10 DIAGNOSIS — Z9841 Cataract extraction status, right eye: Secondary | ICD-10-CM | POA: Diagnosis not present

## 2019-07-10 DIAGNOSIS — E1122 Type 2 diabetes mellitus with diabetic chronic kidney disease: Secondary | ICD-10-CM | POA: Diagnosis not present

## 2019-07-10 DIAGNOSIS — N3001 Acute cystitis with hematuria: Secondary | ICD-10-CM | POA: Diagnosis not present

## 2019-07-10 DIAGNOSIS — F419 Anxiety disorder, unspecified: Secondary | ICD-10-CM | POA: Diagnosis not present

## 2019-07-10 DIAGNOSIS — I672 Cerebral atherosclerosis: Secondary | ICD-10-CM | POA: Diagnosis not present

## 2019-07-10 DIAGNOSIS — M545 Low back pain: Secondary | ICD-10-CM | POA: Diagnosis not present

## 2019-07-10 DIAGNOSIS — F329 Major depressive disorder, single episode, unspecified: Secondary | ICD-10-CM | POA: Diagnosis not present

## 2019-07-10 DIAGNOSIS — I864 Gastric varices: Secondary | ICD-10-CM | POA: Diagnosis not present

## 2019-07-11 DIAGNOSIS — E1165 Type 2 diabetes mellitus with hyperglycemia: Secondary | ICD-10-CM | POA: Diagnosis not present

## 2019-07-11 DIAGNOSIS — N3001 Acute cystitis with hematuria: Secondary | ICD-10-CM | POA: Diagnosis not present

## 2019-07-11 DIAGNOSIS — M545 Low back pain: Secondary | ICD-10-CM | POA: Diagnosis not present

## 2019-07-11 DIAGNOSIS — I672 Cerebral atherosclerosis: Secondary | ICD-10-CM | POA: Diagnosis not present

## 2019-07-11 DIAGNOSIS — N185 Chronic kidney disease, stage 5: Secondary | ICD-10-CM | POA: Diagnosis not present

## 2019-07-11 DIAGNOSIS — I129 Hypertensive chronic kidney disease with stage 1 through stage 4 chronic kidney disease, or unspecified chronic kidney disease: Secondary | ICD-10-CM | POA: Diagnosis not present

## 2019-07-11 DIAGNOSIS — I864 Gastric varices: Secondary | ICD-10-CM | POA: Diagnosis not present

## 2019-07-11 DIAGNOSIS — E1122 Type 2 diabetes mellitus with diabetic chronic kidney disease: Secondary | ICD-10-CM | POA: Diagnosis not present

## 2019-07-11 DIAGNOSIS — F419 Anxiety disorder, unspecified: Secondary | ICD-10-CM | POA: Diagnosis not present

## 2019-07-11 DIAGNOSIS — Z9842 Cataract extraction status, left eye: Secondary | ICD-10-CM | POA: Diagnosis not present

## 2019-07-11 DIAGNOSIS — F329 Major depressive disorder, single episode, unspecified: Secondary | ICD-10-CM | POA: Diagnosis not present

## 2019-07-11 DIAGNOSIS — F209 Schizophrenia, unspecified: Secondary | ICD-10-CM | POA: Diagnosis not present

## 2019-07-11 DIAGNOSIS — G319 Degenerative disease of nervous system, unspecified: Secondary | ICD-10-CM | POA: Diagnosis not present

## 2019-07-11 DIAGNOSIS — Z96653 Presence of artificial knee joint, bilateral: Secondary | ICD-10-CM | POA: Diagnosis not present

## 2019-07-11 DIAGNOSIS — E7800 Pure hypercholesterolemia, unspecified: Secondary | ICD-10-CM | POA: Diagnosis not present

## 2019-07-11 DIAGNOSIS — Z9841 Cataract extraction status, right eye: Secondary | ICD-10-CM | POA: Diagnosis not present

## 2019-07-11 DIAGNOSIS — W19XXXD Unspecified fall, subsequent encounter: Secondary | ICD-10-CM | POA: Diagnosis not present

## 2019-07-11 DIAGNOSIS — I700 Atherosclerosis of aorta: Secondary | ICD-10-CM | POA: Diagnosis not present

## 2019-07-11 DIAGNOSIS — Z9181 History of falling: Secondary | ICD-10-CM | POA: Diagnosis not present

## 2019-07-11 DIAGNOSIS — M1991 Primary osteoarthritis, unspecified site: Secondary | ICD-10-CM | POA: Diagnosis not present

## 2019-07-11 DIAGNOSIS — D631 Anemia in chronic kidney disease: Secondary | ICD-10-CM | POA: Diagnosis not present

## 2019-07-11 DIAGNOSIS — G8929 Other chronic pain: Secondary | ICD-10-CM | POA: Diagnosis not present

## 2019-07-17 DIAGNOSIS — D631 Anemia in chronic kidney disease: Secondary | ICD-10-CM | POA: Diagnosis not present

## 2019-07-17 DIAGNOSIS — N184 Chronic kidney disease, stage 4 (severe): Secondary | ICD-10-CM | POA: Diagnosis not present

## 2019-07-18 DIAGNOSIS — M545 Low back pain: Secondary | ICD-10-CM | POA: Diagnosis not present

## 2019-07-18 DIAGNOSIS — N185 Chronic kidney disease, stage 5: Secondary | ICD-10-CM | POA: Diagnosis not present

## 2019-07-18 DIAGNOSIS — Z9181 History of falling: Secondary | ICD-10-CM | POA: Diagnosis not present

## 2019-07-18 DIAGNOSIS — F209 Schizophrenia, unspecified: Secondary | ICD-10-CM | POA: Diagnosis not present

## 2019-07-18 DIAGNOSIS — F329 Major depressive disorder, single episode, unspecified: Secondary | ICD-10-CM | POA: Diagnosis not present

## 2019-07-18 DIAGNOSIS — I672 Cerebral atherosclerosis: Secondary | ICD-10-CM | POA: Diagnosis not present

## 2019-07-18 DIAGNOSIS — Z96653 Presence of artificial knee joint, bilateral: Secondary | ICD-10-CM | POA: Diagnosis not present

## 2019-07-18 DIAGNOSIS — E7800 Pure hypercholesterolemia, unspecified: Secondary | ICD-10-CM | POA: Diagnosis not present

## 2019-07-18 DIAGNOSIS — N3001 Acute cystitis with hematuria: Secondary | ICD-10-CM | POA: Diagnosis not present

## 2019-07-18 DIAGNOSIS — F419 Anxiety disorder, unspecified: Secondary | ICD-10-CM | POA: Diagnosis not present

## 2019-07-18 DIAGNOSIS — G319 Degenerative disease of nervous system, unspecified: Secondary | ICD-10-CM | POA: Diagnosis not present

## 2019-07-18 DIAGNOSIS — W19XXXD Unspecified fall, subsequent encounter: Secondary | ICD-10-CM | POA: Diagnosis not present

## 2019-07-18 DIAGNOSIS — I129 Hypertensive chronic kidney disease with stage 1 through stage 4 chronic kidney disease, or unspecified chronic kidney disease: Secondary | ICD-10-CM | POA: Diagnosis not present

## 2019-07-18 DIAGNOSIS — G8929 Other chronic pain: Secondary | ICD-10-CM | POA: Diagnosis not present

## 2019-07-18 DIAGNOSIS — Z9841 Cataract extraction status, right eye: Secondary | ICD-10-CM | POA: Diagnosis not present

## 2019-07-18 DIAGNOSIS — M1991 Primary osteoarthritis, unspecified site: Secondary | ICD-10-CM | POA: Diagnosis not present

## 2019-07-18 DIAGNOSIS — D631 Anemia in chronic kidney disease: Secondary | ICD-10-CM | POA: Diagnosis not present

## 2019-07-18 DIAGNOSIS — Z9842 Cataract extraction status, left eye: Secondary | ICD-10-CM | POA: Diagnosis not present

## 2019-07-18 DIAGNOSIS — E1122 Type 2 diabetes mellitus with diabetic chronic kidney disease: Secondary | ICD-10-CM | POA: Diagnosis not present

## 2019-07-18 DIAGNOSIS — I700 Atherosclerosis of aorta: Secondary | ICD-10-CM | POA: Diagnosis not present

## 2019-07-18 DIAGNOSIS — E1165 Type 2 diabetes mellitus with hyperglycemia: Secondary | ICD-10-CM | POA: Diagnosis not present

## 2019-07-18 DIAGNOSIS — I864 Gastric varices: Secondary | ICD-10-CM | POA: Diagnosis not present

## 2019-07-19 DIAGNOSIS — M545 Low back pain: Secondary | ICD-10-CM | POA: Diagnosis not present

## 2019-07-19 DIAGNOSIS — G8929 Other chronic pain: Secondary | ICD-10-CM | POA: Diagnosis not present

## 2019-07-19 DIAGNOSIS — W19XXXD Unspecified fall, subsequent encounter: Secondary | ICD-10-CM | POA: Diagnosis not present

## 2019-07-19 DIAGNOSIS — I129 Hypertensive chronic kidney disease with stage 1 through stage 4 chronic kidney disease, or unspecified chronic kidney disease: Secondary | ICD-10-CM | POA: Diagnosis not present

## 2019-07-19 DIAGNOSIS — N3001 Acute cystitis with hematuria: Secondary | ICD-10-CM | POA: Diagnosis not present

## 2019-07-19 DIAGNOSIS — I864 Gastric varices: Secondary | ICD-10-CM | POA: Diagnosis not present

## 2019-07-19 DIAGNOSIS — E7800 Pure hypercholesterolemia, unspecified: Secondary | ICD-10-CM | POA: Diagnosis not present

## 2019-07-19 DIAGNOSIS — N185 Chronic kidney disease, stage 5: Secondary | ICD-10-CM | POA: Diagnosis not present

## 2019-07-19 DIAGNOSIS — F329 Major depressive disorder, single episode, unspecified: Secondary | ICD-10-CM | POA: Diagnosis not present

## 2019-07-19 DIAGNOSIS — E1122 Type 2 diabetes mellitus with diabetic chronic kidney disease: Secondary | ICD-10-CM | POA: Diagnosis not present

## 2019-07-19 DIAGNOSIS — Z9181 History of falling: Secondary | ICD-10-CM | POA: Diagnosis not present

## 2019-07-19 DIAGNOSIS — Z96653 Presence of artificial knee joint, bilateral: Secondary | ICD-10-CM | POA: Diagnosis not present

## 2019-07-19 DIAGNOSIS — N189 Chronic kidney disease, unspecified: Secondary | ICD-10-CM | POA: Diagnosis not present

## 2019-07-19 DIAGNOSIS — E1165 Type 2 diabetes mellitus with hyperglycemia: Secondary | ICD-10-CM | POA: Diagnosis not present

## 2019-07-19 DIAGNOSIS — I672 Cerebral atherosclerosis: Secondary | ICD-10-CM | POA: Diagnosis not present

## 2019-07-19 DIAGNOSIS — Z9841 Cataract extraction status, right eye: Secondary | ICD-10-CM | POA: Diagnosis not present

## 2019-07-19 DIAGNOSIS — Z9842 Cataract extraction status, left eye: Secondary | ICD-10-CM | POA: Diagnosis not present

## 2019-07-19 DIAGNOSIS — F419 Anxiety disorder, unspecified: Secondary | ICD-10-CM | POA: Diagnosis not present

## 2019-07-19 DIAGNOSIS — G319 Degenerative disease of nervous system, unspecified: Secondary | ICD-10-CM | POA: Diagnosis not present

## 2019-07-19 DIAGNOSIS — D631 Anemia in chronic kidney disease: Secondary | ICD-10-CM | POA: Diagnosis not present

## 2019-07-19 DIAGNOSIS — F209 Schizophrenia, unspecified: Secondary | ICD-10-CM | POA: Diagnosis not present

## 2019-07-19 DIAGNOSIS — I700 Atherosclerosis of aorta: Secondary | ICD-10-CM | POA: Diagnosis not present

## 2019-07-19 DIAGNOSIS — M1991 Primary osteoarthritis, unspecified site: Secondary | ICD-10-CM | POA: Diagnosis not present

## 2019-07-20 DIAGNOSIS — I700 Atherosclerosis of aorta: Secondary | ICD-10-CM | POA: Diagnosis not present

## 2019-07-20 DIAGNOSIS — E1165 Type 2 diabetes mellitus with hyperglycemia: Secondary | ICD-10-CM | POA: Diagnosis not present

## 2019-07-20 DIAGNOSIS — G8929 Other chronic pain: Secondary | ICD-10-CM | POA: Diagnosis not present

## 2019-07-20 DIAGNOSIS — M1991 Primary osteoarthritis, unspecified site: Secondary | ICD-10-CM | POA: Diagnosis not present

## 2019-07-20 DIAGNOSIS — Z96653 Presence of artificial knee joint, bilateral: Secondary | ICD-10-CM | POA: Diagnosis not present

## 2019-07-20 DIAGNOSIS — I864 Gastric varices: Secondary | ICD-10-CM | POA: Diagnosis not present

## 2019-07-20 DIAGNOSIS — F329 Major depressive disorder, single episode, unspecified: Secondary | ICD-10-CM | POA: Diagnosis not present

## 2019-07-20 DIAGNOSIS — I672 Cerebral atherosclerosis: Secondary | ICD-10-CM | POA: Diagnosis not present

## 2019-07-20 DIAGNOSIS — F419 Anxiety disorder, unspecified: Secondary | ICD-10-CM | POA: Diagnosis not present

## 2019-07-20 DIAGNOSIS — E7800 Pure hypercholesterolemia, unspecified: Secondary | ICD-10-CM | POA: Diagnosis not present

## 2019-07-20 DIAGNOSIS — Z9842 Cataract extraction status, left eye: Secondary | ICD-10-CM | POA: Diagnosis not present

## 2019-07-20 DIAGNOSIS — N3001 Acute cystitis with hematuria: Secondary | ICD-10-CM | POA: Diagnosis not present

## 2019-07-20 DIAGNOSIS — D631 Anemia in chronic kidney disease: Secondary | ICD-10-CM | POA: Diagnosis not present

## 2019-07-20 DIAGNOSIS — M545 Low back pain: Secondary | ICD-10-CM | POA: Diagnosis not present

## 2019-07-20 DIAGNOSIS — W19XXXD Unspecified fall, subsequent encounter: Secondary | ICD-10-CM | POA: Diagnosis not present

## 2019-07-20 DIAGNOSIS — N185 Chronic kidney disease, stage 5: Secondary | ICD-10-CM | POA: Diagnosis not present

## 2019-07-20 DIAGNOSIS — Z9841 Cataract extraction status, right eye: Secondary | ICD-10-CM | POA: Diagnosis not present

## 2019-07-20 DIAGNOSIS — F209 Schizophrenia, unspecified: Secondary | ICD-10-CM | POA: Diagnosis not present

## 2019-07-20 DIAGNOSIS — Z9181 History of falling: Secondary | ICD-10-CM | POA: Diagnosis not present

## 2019-07-20 DIAGNOSIS — I129 Hypertensive chronic kidney disease with stage 1 through stage 4 chronic kidney disease, or unspecified chronic kidney disease: Secondary | ICD-10-CM | POA: Diagnosis not present

## 2019-07-20 DIAGNOSIS — G319 Degenerative disease of nervous system, unspecified: Secondary | ICD-10-CM | POA: Diagnosis not present

## 2019-07-20 DIAGNOSIS — E1122 Type 2 diabetes mellitus with diabetic chronic kidney disease: Secondary | ICD-10-CM | POA: Diagnosis not present

## 2019-07-25 DIAGNOSIS — G8929 Other chronic pain: Secondary | ICD-10-CM | POA: Diagnosis not present

## 2019-07-25 DIAGNOSIS — Z9841 Cataract extraction status, right eye: Secondary | ICD-10-CM | POA: Diagnosis not present

## 2019-07-25 DIAGNOSIS — I700 Atherosclerosis of aorta: Secondary | ICD-10-CM | POA: Diagnosis not present

## 2019-07-25 DIAGNOSIS — G319 Degenerative disease of nervous system, unspecified: Secondary | ICD-10-CM | POA: Diagnosis not present

## 2019-07-25 DIAGNOSIS — E7800 Pure hypercholesterolemia, unspecified: Secondary | ICD-10-CM | POA: Diagnosis not present

## 2019-07-25 DIAGNOSIS — N185 Chronic kidney disease, stage 5: Secondary | ICD-10-CM | POA: Diagnosis not present

## 2019-07-25 DIAGNOSIS — E1122 Type 2 diabetes mellitus with diabetic chronic kidney disease: Secondary | ICD-10-CM | POA: Diagnosis not present

## 2019-07-25 DIAGNOSIS — E1165 Type 2 diabetes mellitus with hyperglycemia: Secondary | ICD-10-CM | POA: Diagnosis not present

## 2019-07-25 DIAGNOSIS — Z9842 Cataract extraction status, left eye: Secondary | ICD-10-CM | POA: Diagnosis not present

## 2019-07-25 DIAGNOSIS — I129 Hypertensive chronic kidney disease with stage 1 through stage 4 chronic kidney disease, or unspecified chronic kidney disease: Secondary | ICD-10-CM | POA: Diagnosis not present

## 2019-07-25 DIAGNOSIS — I864 Gastric varices: Secondary | ICD-10-CM | POA: Diagnosis not present

## 2019-07-25 DIAGNOSIS — D631 Anemia in chronic kidney disease: Secondary | ICD-10-CM | POA: Diagnosis not present

## 2019-07-25 DIAGNOSIS — I672 Cerebral atherosclerosis: Secondary | ICD-10-CM | POA: Diagnosis not present

## 2019-07-25 DIAGNOSIS — Z9181 History of falling: Secondary | ICD-10-CM | POA: Diagnosis not present

## 2019-07-25 DIAGNOSIS — M545 Low back pain: Secondary | ICD-10-CM | POA: Diagnosis not present

## 2019-07-25 DIAGNOSIS — N3001 Acute cystitis with hematuria: Secondary | ICD-10-CM | POA: Diagnosis not present

## 2019-07-25 DIAGNOSIS — W19XXXD Unspecified fall, subsequent encounter: Secondary | ICD-10-CM | POA: Diagnosis not present

## 2019-07-25 DIAGNOSIS — M1991 Primary osteoarthritis, unspecified site: Secondary | ICD-10-CM | POA: Diagnosis not present

## 2019-07-25 DIAGNOSIS — Z96653 Presence of artificial knee joint, bilateral: Secondary | ICD-10-CM | POA: Diagnosis not present

## 2019-07-26 DIAGNOSIS — E1165 Type 2 diabetes mellitus with hyperglycemia: Secondary | ICD-10-CM | POA: Diagnosis not present

## 2019-07-26 DIAGNOSIS — M1991 Primary osteoarthritis, unspecified site: Secondary | ICD-10-CM | POA: Diagnosis not present

## 2019-07-26 DIAGNOSIS — Z9181 History of falling: Secondary | ICD-10-CM | POA: Diagnosis not present

## 2019-07-26 DIAGNOSIS — M545 Low back pain: Secondary | ICD-10-CM | POA: Diagnosis not present

## 2019-07-26 DIAGNOSIS — N185 Chronic kidney disease, stage 5: Secondary | ICD-10-CM | POA: Diagnosis not present

## 2019-07-26 DIAGNOSIS — Z9841 Cataract extraction status, right eye: Secondary | ICD-10-CM | POA: Diagnosis not present

## 2019-07-26 DIAGNOSIS — I672 Cerebral atherosclerosis: Secondary | ICD-10-CM | POA: Diagnosis not present

## 2019-07-26 DIAGNOSIS — E7800 Pure hypercholesterolemia, unspecified: Secondary | ICD-10-CM | POA: Diagnosis not present

## 2019-07-26 DIAGNOSIS — I129 Hypertensive chronic kidney disease with stage 1 through stage 4 chronic kidney disease, or unspecified chronic kidney disease: Secondary | ICD-10-CM | POA: Diagnosis not present

## 2019-07-26 DIAGNOSIS — G319 Degenerative disease of nervous system, unspecified: Secondary | ICD-10-CM | POA: Diagnosis not present

## 2019-07-26 DIAGNOSIS — N3001 Acute cystitis with hematuria: Secondary | ICD-10-CM | POA: Diagnosis not present

## 2019-07-26 DIAGNOSIS — I864 Gastric varices: Secondary | ICD-10-CM | POA: Diagnosis not present

## 2019-07-26 DIAGNOSIS — Z96653 Presence of artificial knee joint, bilateral: Secondary | ICD-10-CM | POA: Diagnosis not present

## 2019-07-26 DIAGNOSIS — Z9842 Cataract extraction status, left eye: Secondary | ICD-10-CM | POA: Diagnosis not present

## 2019-07-26 DIAGNOSIS — W19XXXD Unspecified fall, subsequent encounter: Secondary | ICD-10-CM | POA: Diagnosis not present

## 2019-07-26 DIAGNOSIS — E1122 Type 2 diabetes mellitus with diabetic chronic kidney disease: Secondary | ICD-10-CM | POA: Diagnosis not present

## 2019-07-26 DIAGNOSIS — D631 Anemia in chronic kidney disease: Secondary | ICD-10-CM | POA: Diagnosis not present

## 2019-07-26 DIAGNOSIS — I700 Atherosclerosis of aorta: Secondary | ICD-10-CM | POA: Diagnosis not present

## 2019-07-26 DIAGNOSIS — G8929 Other chronic pain: Secondary | ICD-10-CM | POA: Diagnosis not present

## 2019-07-30 DIAGNOSIS — D631 Anemia in chronic kidney disease: Secondary | ICD-10-CM | POA: Diagnosis not present

## 2019-07-30 DIAGNOSIS — G8929 Other chronic pain: Secondary | ICD-10-CM | POA: Diagnosis not present

## 2019-07-30 DIAGNOSIS — Z9181 History of falling: Secondary | ICD-10-CM | POA: Diagnosis not present

## 2019-07-30 DIAGNOSIS — W19XXXD Unspecified fall, subsequent encounter: Secondary | ICD-10-CM | POA: Diagnosis not present

## 2019-07-30 DIAGNOSIS — Z9842 Cataract extraction status, left eye: Secondary | ICD-10-CM | POA: Diagnosis not present

## 2019-07-30 DIAGNOSIS — E7800 Pure hypercholesterolemia, unspecified: Secondary | ICD-10-CM | POA: Diagnosis not present

## 2019-07-30 DIAGNOSIS — I672 Cerebral atherosclerosis: Secondary | ICD-10-CM | POA: Diagnosis not present

## 2019-07-30 DIAGNOSIS — I700 Atherosclerosis of aorta: Secondary | ICD-10-CM | POA: Diagnosis not present

## 2019-07-30 DIAGNOSIS — E1122 Type 2 diabetes mellitus with diabetic chronic kidney disease: Secondary | ICD-10-CM | POA: Diagnosis not present

## 2019-07-30 DIAGNOSIS — N3001 Acute cystitis with hematuria: Secondary | ICD-10-CM | POA: Diagnosis not present

## 2019-07-30 DIAGNOSIS — Z96653 Presence of artificial knee joint, bilateral: Secondary | ICD-10-CM | POA: Diagnosis not present

## 2019-07-30 DIAGNOSIS — M545 Low back pain: Secondary | ICD-10-CM | POA: Diagnosis not present

## 2019-07-30 DIAGNOSIS — I864 Gastric varices: Secondary | ICD-10-CM | POA: Diagnosis not present

## 2019-07-30 DIAGNOSIS — M1991 Primary osteoarthritis, unspecified site: Secondary | ICD-10-CM | POA: Diagnosis not present

## 2019-07-30 DIAGNOSIS — N185 Chronic kidney disease, stage 5: Secondary | ICD-10-CM | POA: Diagnosis not present

## 2019-07-30 DIAGNOSIS — G319 Degenerative disease of nervous system, unspecified: Secondary | ICD-10-CM | POA: Diagnosis not present

## 2019-07-30 DIAGNOSIS — I129 Hypertensive chronic kidney disease with stage 1 through stage 4 chronic kidney disease, or unspecified chronic kidney disease: Secondary | ICD-10-CM | POA: Diagnosis not present

## 2019-07-30 DIAGNOSIS — Z9841 Cataract extraction status, right eye: Secondary | ICD-10-CM | POA: Diagnosis not present

## 2019-07-30 DIAGNOSIS — E1165 Type 2 diabetes mellitus with hyperglycemia: Secondary | ICD-10-CM | POA: Diagnosis not present

## 2019-07-31 DIAGNOSIS — N184 Chronic kidney disease, stage 4 (severe): Secondary | ICD-10-CM | POA: Diagnosis not present

## 2019-07-31 DIAGNOSIS — D631 Anemia in chronic kidney disease: Secondary | ICD-10-CM | POA: Diagnosis not present

## 2019-08-02 DIAGNOSIS — G319 Degenerative disease of nervous system, unspecified: Secondary | ICD-10-CM | POA: Diagnosis not present

## 2019-08-02 DIAGNOSIS — E1122 Type 2 diabetes mellitus with diabetic chronic kidney disease: Secondary | ICD-10-CM | POA: Diagnosis not present

## 2019-08-02 DIAGNOSIS — G8929 Other chronic pain: Secondary | ICD-10-CM | POA: Diagnosis not present

## 2019-08-02 DIAGNOSIS — D631 Anemia in chronic kidney disease: Secondary | ICD-10-CM | POA: Diagnosis not present

## 2019-08-02 DIAGNOSIS — W19XXXD Unspecified fall, subsequent encounter: Secondary | ICD-10-CM | POA: Diagnosis not present

## 2019-08-02 DIAGNOSIS — I672 Cerebral atherosclerosis: Secondary | ICD-10-CM | POA: Diagnosis not present

## 2019-08-02 DIAGNOSIS — M1991 Primary osteoarthritis, unspecified site: Secondary | ICD-10-CM | POA: Diagnosis not present

## 2019-08-02 DIAGNOSIS — Z96653 Presence of artificial knee joint, bilateral: Secondary | ICD-10-CM | POA: Diagnosis not present

## 2019-08-02 DIAGNOSIS — M545 Low back pain: Secondary | ICD-10-CM | POA: Diagnosis not present

## 2019-08-02 DIAGNOSIS — Z9841 Cataract extraction status, right eye: Secondary | ICD-10-CM | POA: Diagnosis not present

## 2019-08-02 DIAGNOSIS — I129 Hypertensive chronic kidney disease with stage 1 through stage 4 chronic kidney disease, or unspecified chronic kidney disease: Secondary | ICD-10-CM | POA: Diagnosis not present

## 2019-08-02 DIAGNOSIS — I700 Atherosclerosis of aorta: Secondary | ICD-10-CM | POA: Diagnosis not present

## 2019-08-02 DIAGNOSIS — N3001 Acute cystitis with hematuria: Secondary | ICD-10-CM | POA: Diagnosis not present

## 2019-08-02 DIAGNOSIS — E1165 Type 2 diabetes mellitus with hyperglycemia: Secondary | ICD-10-CM | POA: Diagnosis not present

## 2019-08-02 DIAGNOSIS — Z9181 History of falling: Secondary | ICD-10-CM | POA: Diagnosis not present

## 2019-08-02 DIAGNOSIS — E7800 Pure hypercholesterolemia, unspecified: Secondary | ICD-10-CM | POA: Diagnosis not present

## 2019-08-02 DIAGNOSIS — N185 Chronic kidney disease, stage 5: Secondary | ICD-10-CM | POA: Diagnosis not present

## 2019-08-02 DIAGNOSIS — I864 Gastric varices: Secondary | ICD-10-CM | POA: Diagnosis not present

## 2019-08-02 DIAGNOSIS — Z9842 Cataract extraction status, left eye: Secondary | ICD-10-CM | POA: Diagnosis not present

## 2019-08-03 DIAGNOSIS — E1122 Type 2 diabetes mellitus with diabetic chronic kidney disease: Secondary | ICD-10-CM | POA: Diagnosis not present

## 2019-08-03 DIAGNOSIS — N3001 Acute cystitis with hematuria: Secondary | ICD-10-CM | POA: Diagnosis not present

## 2019-08-03 DIAGNOSIS — N185 Chronic kidney disease, stage 5: Secondary | ICD-10-CM | POA: Diagnosis not present

## 2019-08-03 DIAGNOSIS — Z9841 Cataract extraction status, right eye: Secondary | ICD-10-CM | POA: Diagnosis not present

## 2019-08-03 DIAGNOSIS — Z96653 Presence of artificial knee joint, bilateral: Secondary | ICD-10-CM | POA: Diagnosis not present

## 2019-08-03 DIAGNOSIS — G8929 Other chronic pain: Secondary | ICD-10-CM | POA: Diagnosis not present

## 2019-08-03 DIAGNOSIS — Z9181 History of falling: Secondary | ICD-10-CM | POA: Diagnosis not present

## 2019-08-03 DIAGNOSIS — G319 Degenerative disease of nervous system, unspecified: Secondary | ICD-10-CM | POA: Diagnosis not present

## 2019-08-03 DIAGNOSIS — M545 Low back pain: Secondary | ICD-10-CM | POA: Diagnosis not present

## 2019-08-03 DIAGNOSIS — W19XXXD Unspecified fall, subsequent encounter: Secondary | ICD-10-CM | POA: Diagnosis not present

## 2019-08-03 DIAGNOSIS — I672 Cerebral atherosclerosis: Secondary | ICD-10-CM | POA: Diagnosis not present

## 2019-08-03 DIAGNOSIS — I700 Atherosclerosis of aorta: Secondary | ICD-10-CM | POA: Diagnosis not present

## 2019-08-03 DIAGNOSIS — E7800 Pure hypercholesterolemia, unspecified: Secondary | ICD-10-CM | POA: Diagnosis not present

## 2019-08-03 DIAGNOSIS — I129 Hypertensive chronic kidney disease with stage 1 through stage 4 chronic kidney disease, or unspecified chronic kidney disease: Secondary | ICD-10-CM | POA: Diagnosis not present

## 2019-08-03 DIAGNOSIS — E1165 Type 2 diabetes mellitus with hyperglycemia: Secondary | ICD-10-CM | POA: Diagnosis not present

## 2019-08-03 DIAGNOSIS — D631 Anemia in chronic kidney disease: Secondary | ICD-10-CM | POA: Diagnosis not present

## 2019-08-03 DIAGNOSIS — Z9842 Cataract extraction status, left eye: Secondary | ICD-10-CM | POA: Diagnosis not present

## 2019-08-03 DIAGNOSIS — I864 Gastric varices: Secondary | ICD-10-CM | POA: Diagnosis not present

## 2019-08-03 DIAGNOSIS — M1991 Primary osteoarthritis, unspecified site: Secondary | ICD-10-CM | POA: Diagnosis not present

## 2019-08-04 DIAGNOSIS — H8110 Benign paroxysmal vertigo, unspecified ear: Secondary | ICD-10-CM | POA: Diagnosis not present

## 2019-08-04 DIAGNOSIS — R42 Dizziness and giddiness: Secondary | ICD-10-CM | POA: Diagnosis not present

## 2019-08-04 DIAGNOSIS — R531 Weakness: Secondary | ICD-10-CM | POA: Diagnosis not present

## 2019-08-08 DIAGNOSIS — Z96653 Presence of artificial knee joint, bilateral: Secondary | ICD-10-CM | POA: Diagnosis not present

## 2019-08-08 DIAGNOSIS — G8929 Other chronic pain: Secondary | ICD-10-CM | POA: Diagnosis not present

## 2019-08-08 DIAGNOSIS — E1122 Type 2 diabetes mellitus with diabetic chronic kidney disease: Secondary | ICD-10-CM | POA: Diagnosis not present

## 2019-08-08 DIAGNOSIS — M545 Low back pain: Secondary | ICD-10-CM | POA: Diagnosis not present

## 2019-08-08 DIAGNOSIS — Z9841 Cataract extraction status, right eye: Secondary | ICD-10-CM | POA: Diagnosis not present

## 2019-08-08 DIAGNOSIS — N3001 Acute cystitis with hematuria: Secondary | ICD-10-CM | POA: Diagnosis not present

## 2019-08-08 DIAGNOSIS — Z9181 History of falling: Secondary | ICD-10-CM | POA: Diagnosis not present

## 2019-08-08 DIAGNOSIS — I864 Gastric varices: Secondary | ICD-10-CM | POA: Diagnosis not present

## 2019-08-08 DIAGNOSIS — I672 Cerebral atherosclerosis: Secondary | ICD-10-CM | POA: Diagnosis not present

## 2019-08-08 DIAGNOSIS — E1165 Type 2 diabetes mellitus with hyperglycemia: Secondary | ICD-10-CM | POA: Diagnosis not present

## 2019-08-08 DIAGNOSIS — M1991 Primary osteoarthritis, unspecified site: Secondary | ICD-10-CM | POA: Diagnosis not present

## 2019-08-08 DIAGNOSIS — E7800 Pure hypercholesterolemia, unspecified: Secondary | ICD-10-CM | POA: Diagnosis not present

## 2019-08-08 DIAGNOSIS — D631 Anemia in chronic kidney disease: Secondary | ICD-10-CM | POA: Diagnosis not present

## 2019-08-08 DIAGNOSIS — I129 Hypertensive chronic kidney disease with stage 1 through stage 4 chronic kidney disease, or unspecified chronic kidney disease: Secondary | ICD-10-CM | POA: Diagnosis not present

## 2019-08-08 DIAGNOSIS — I700 Atherosclerosis of aorta: Secondary | ICD-10-CM | POA: Diagnosis not present

## 2019-08-08 DIAGNOSIS — G319 Degenerative disease of nervous system, unspecified: Secondary | ICD-10-CM | POA: Diagnosis not present

## 2019-08-08 DIAGNOSIS — Z9842 Cataract extraction status, left eye: Secondary | ICD-10-CM | POA: Diagnosis not present

## 2019-08-08 DIAGNOSIS — N185 Chronic kidney disease, stage 5: Secondary | ICD-10-CM | POA: Diagnosis not present

## 2019-08-08 DIAGNOSIS — W19XXXD Unspecified fall, subsequent encounter: Secondary | ICD-10-CM | POA: Diagnosis not present

## 2019-08-10 DIAGNOSIS — E1169 Type 2 diabetes mellitus with other specified complication: Secondary | ICD-10-CM | POA: Diagnosis not present

## 2019-08-10 DIAGNOSIS — D509 Iron deficiency anemia, unspecified: Secondary | ICD-10-CM | POA: Diagnosis not present

## 2019-08-15 DIAGNOSIS — Z9841 Cataract extraction status, right eye: Secondary | ICD-10-CM | POA: Diagnosis not present

## 2019-08-15 DIAGNOSIS — E1122 Type 2 diabetes mellitus with diabetic chronic kidney disease: Secondary | ICD-10-CM | POA: Diagnosis not present

## 2019-08-15 DIAGNOSIS — E1165 Type 2 diabetes mellitus with hyperglycemia: Secondary | ICD-10-CM | POA: Diagnosis not present

## 2019-08-15 DIAGNOSIS — I129 Hypertensive chronic kidney disease with stage 1 through stage 4 chronic kidney disease, or unspecified chronic kidney disease: Secondary | ICD-10-CM | POA: Diagnosis not present

## 2019-08-15 DIAGNOSIS — N185 Chronic kidney disease, stage 5: Secondary | ICD-10-CM | POA: Diagnosis not present

## 2019-08-15 DIAGNOSIS — I672 Cerebral atherosclerosis: Secondary | ICD-10-CM | POA: Diagnosis not present

## 2019-08-15 DIAGNOSIS — Z96653 Presence of artificial knee joint, bilateral: Secondary | ICD-10-CM | POA: Diagnosis not present

## 2019-08-15 DIAGNOSIS — E7800 Pure hypercholesterolemia, unspecified: Secondary | ICD-10-CM | POA: Diagnosis not present

## 2019-08-15 DIAGNOSIS — Z9842 Cataract extraction status, left eye: Secondary | ICD-10-CM | POA: Diagnosis not present

## 2019-08-15 DIAGNOSIS — G8929 Other chronic pain: Secondary | ICD-10-CM | POA: Diagnosis not present

## 2019-08-15 DIAGNOSIS — N3001 Acute cystitis with hematuria: Secondary | ICD-10-CM | POA: Diagnosis not present

## 2019-08-15 DIAGNOSIS — G319 Degenerative disease of nervous system, unspecified: Secondary | ICD-10-CM | POA: Diagnosis not present

## 2019-08-15 DIAGNOSIS — D631 Anemia in chronic kidney disease: Secondary | ICD-10-CM | POA: Diagnosis not present

## 2019-08-15 DIAGNOSIS — Z9181 History of falling: Secondary | ICD-10-CM | POA: Diagnosis not present

## 2019-08-15 DIAGNOSIS — W19XXXD Unspecified fall, subsequent encounter: Secondary | ICD-10-CM | POA: Diagnosis not present

## 2019-08-15 DIAGNOSIS — M545 Low back pain: Secondary | ICD-10-CM | POA: Diagnosis not present

## 2019-08-15 DIAGNOSIS — I700 Atherosclerosis of aorta: Secondary | ICD-10-CM | POA: Diagnosis not present

## 2019-08-15 DIAGNOSIS — I864 Gastric varices: Secondary | ICD-10-CM | POA: Diagnosis not present

## 2019-08-15 DIAGNOSIS — M1991 Primary osteoarthritis, unspecified site: Secondary | ICD-10-CM | POA: Diagnosis not present

## 2019-08-16 DIAGNOSIS — Z9181 History of falling: Secondary | ICD-10-CM | POA: Diagnosis not present

## 2019-08-16 DIAGNOSIS — I6523 Occlusion and stenosis of bilateral carotid arteries: Secondary | ICD-10-CM | POA: Diagnosis not present

## 2019-08-16 DIAGNOSIS — E1122 Type 2 diabetes mellitus with diabetic chronic kidney disease: Secondary | ICD-10-CM | POA: Diagnosis not present

## 2019-08-16 DIAGNOSIS — R69 Illness, unspecified: Secondary | ICD-10-CM | POA: Diagnosis not present

## 2019-08-16 DIAGNOSIS — R6510 Systemic inflammatory response syndrome (SIRS) of non-infectious origin without acute organ dysfunction: Secondary | ICD-10-CM | POA: Diagnosis not present

## 2019-08-16 DIAGNOSIS — R42 Dizziness and giddiness: Secondary | ICD-10-CM | POA: Diagnosis not present

## 2019-08-16 DIAGNOSIS — R531 Weakness: Secondary | ICD-10-CM | POA: Diagnosis not present

## 2019-08-16 DIAGNOSIS — I132 Hypertensive heart and chronic kidney disease with heart failure and with stage 5 chronic kidney disease, or end stage renal disease: Secondary | ICD-10-CM | POA: Diagnosis not present

## 2019-08-16 DIAGNOSIS — E1169 Type 2 diabetes mellitus with other specified complication: Secondary | ICD-10-CM | POA: Diagnosis not present

## 2019-08-16 DIAGNOSIS — Z23 Encounter for immunization: Secondary | ICD-10-CM | POA: Diagnosis not present

## 2019-08-16 DIAGNOSIS — J311 Chronic nasopharyngitis: Secondary | ICD-10-CM | POA: Diagnosis not present

## 2019-08-16 DIAGNOSIS — K5900 Constipation, unspecified: Secondary | ICD-10-CM | POA: Diagnosis not present

## 2019-08-16 DIAGNOSIS — N185 Chronic kidney disease, stage 5: Secondary | ICD-10-CM | POA: Diagnosis not present

## 2019-08-16 DIAGNOSIS — K5730 Diverticulosis of large intestine without perforation or abscess without bleeding: Secondary | ICD-10-CM | POA: Diagnosis not present

## 2019-08-16 DIAGNOSIS — Z79899 Other long term (current) drug therapy: Secondary | ICD-10-CM | POA: Diagnosis not present

## 2019-08-16 DIAGNOSIS — E785 Hyperlipidemia, unspecified: Secondary | ICD-10-CM | POA: Diagnosis not present

## 2019-08-16 DIAGNOSIS — I129 Hypertensive chronic kidney disease with stage 1 through stage 4 chronic kidney disease, or unspecified chronic kidney disease: Secondary | ICD-10-CM | POA: Diagnosis not present

## 2019-08-16 DIAGNOSIS — M6281 Muscle weakness (generalized): Secondary | ICD-10-CM | POA: Diagnosis not present

## 2019-08-16 DIAGNOSIS — Z743 Need for continuous supervision: Secondary | ICD-10-CM | POA: Diagnosis not present

## 2019-08-16 DIAGNOSIS — I770 Arteriovenous fistula, acquired: Secondary | ICD-10-CM | POA: Diagnosis not present

## 2019-08-16 DIAGNOSIS — M1990 Unspecified osteoarthritis, unspecified site: Secondary | ICD-10-CM | POA: Diagnosis not present

## 2019-08-16 DIAGNOSIS — N179 Acute kidney failure, unspecified: Secondary | ICD-10-CM | POA: Diagnosis not present

## 2019-08-16 DIAGNOSIS — Z7401 Bed confinement status: Secondary | ICD-10-CM | POA: Diagnosis not present

## 2019-08-16 DIAGNOSIS — Z7984 Long term (current) use of oral hypoglycemic drugs: Secondary | ICD-10-CM | POA: Diagnosis not present

## 2019-08-16 DIAGNOSIS — I10 Essential (primary) hypertension: Secondary | ICD-10-CM

## 2019-08-16 DIAGNOSIS — I951 Orthostatic hypotension: Secondary | ICD-10-CM | POA: Diagnosis not present

## 2019-08-16 DIAGNOSIS — R55 Syncope and collapse: Secondary | ICD-10-CM | POA: Diagnosis not present

## 2019-08-16 DIAGNOSIS — Z03818 Encounter for observation for suspected exposure to other biological agents ruled out: Secondary | ICD-10-CM | POA: Diagnosis not present

## 2019-08-16 DIAGNOSIS — R188 Other ascites: Secondary | ICD-10-CM | POA: Diagnosis not present

## 2019-08-16 DIAGNOSIS — Z8719 Personal history of other diseases of the digestive system: Secondary | ICD-10-CM | POA: Diagnosis not present

## 2019-08-16 DIAGNOSIS — E860 Dehydration: Secondary | ICD-10-CM | POA: Diagnosis not present

## 2019-08-16 DIAGNOSIS — K7460 Unspecified cirrhosis of liver: Secondary | ICD-10-CM | POA: Diagnosis not present

## 2019-08-16 DIAGNOSIS — N189 Chronic kidney disease, unspecified: Secondary | ICD-10-CM | POA: Diagnosis not present

## 2019-08-16 DIAGNOSIS — G63 Polyneuropathy in diseases classified elsewhere: Secondary | ICD-10-CM | POA: Diagnosis not present

## 2019-08-16 DIAGNOSIS — R52 Pain, unspecified: Secondary | ICD-10-CM | POA: Diagnosis not present

## 2019-08-16 DIAGNOSIS — N3281 Overactive bladder: Secondary | ICD-10-CM | POA: Diagnosis not present

## 2019-08-16 DIAGNOSIS — R262 Difficulty in walking, not elsewhere classified: Secondary | ICD-10-CM | POA: Diagnosis not present

## 2019-08-16 DIAGNOSIS — I4891 Unspecified atrial fibrillation: Secondary | ICD-10-CM | POA: Diagnosis not present

## 2019-08-16 DIAGNOSIS — I313 Pericardial effusion (noninflammatory): Secondary | ICD-10-CM | POA: Diagnosis not present

## 2019-08-16 DIAGNOSIS — I509 Heart failure, unspecified: Secondary | ICD-10-CM | POA: Diagnosis not present

## 2019-08-16 DIAGNOSIS — G4700 Insomnia, unspecified: Secondary | ICD-10-CM | POA: Diagnosis not present

## 2019-08-16 DIAGNOSIS — R109 Unspecified abdominal pain: Secondary | ICD-10-CM | POA: Diagnosis not present

## 2019-08-16 DIAGNOSIS — W19XXXA Unspecified fall, initial encounter: Secondary | ICD-10-CM | POA: Diagnosis not present

## 2019-08-16 DIAGNOSIS — R278 Other lack of coordination: Secondary | ICD-10-CM | POA: Diagnosis not present

## 2019-08-16 DIAGNOSIS — R5381 Other malaise: Secondary | ICD-10-CM | POA: Diagnosis not present

## 2019-08-18 DIAGNOSIS — I313 Pericardial effusion (noninflammatory): Secondary | ICD-10-CM

## 2019-08-20 DIAGNOSIS — R55 Syncope and collapse: Secondary | ICD-10-CM | POA: Diagnosis not present

## 2019-08-20 DIAGNOSIS — R531 Weakness: Secondary | ICD-10-CM | POA: Diagnosis not present

## 2019-08-20 DIAGNOSIS — I951 Orthostatic hypotension: Secondary | ICD-10-CM | POA: Diagnosis not present

## 2019-08-20 DIAGNOSIS — R109 Unspecified abdominal pain: Secondary | ICD-10-CM | POA: Diagnosis not present

## 2019-08-21 DIAGNOSIS — R531 Weakness: Secondary | ICD-10-CM | POA: Diagnosis not present

## 2019-08-21 DIAGNOSIS — I951 Orthostatic hypotension: Secondary | ICD-10-CM | POA: Diagnosis not present

## 2019-08-21 DIAGNOSIS — R55 Syncope and collapse: Secondary | ICD-10-CM | POA: Diagnosis not present

## 2019-08-21 DIAGNOSIS — R109 Unspecified abdominal pain: Secondary | ICD-10-CM | POA: Diagnosis not present

## 2019-08-22 DIAGNOSIS — I110 Hypertensive heart disease with heart failure: Secondary | ICD-10-CM | POA: Diagnosis not present

## 2019-08-22 DIAGNOSIS — J311 Chronic nasopharyngitis: Secondary | ICD-10-CM | POA: Diagnosis not present

## 2019-08-22 DIAGNOSIS — E569 Vitamin deficiency, unspecified: Secondary | ICD-10-CM | POA: Diagnosis not present

## 2019-08-22 DIAGNOSIS — R278 Other lack of coordination: Secondary | ICD-10-CM | POA: Diagnosis not present

## 2019-08-22 DIAGNOSIS — M1990 Unspecified osteoarthritis, unspecified site: Secondary | ICD-10-CM | POA: Diagnosis not present

## 2019-08-22 DIAGNOSIS — M6281 Muscle weakness (generalized): Secondary | ICD-10-CM | POA: Diagnosis not present

## 2019-08-22 DIAGNOSIS — Z8719 Personal history of other diseases of the digestive system: Secondary | ICD-10-CM | POA: Diagnosis not present

## 2019-08-22 DIAGNOSIS — Z9181 History of falling: Secondary | ICD-10-CM | POA: Diagnosis not present

## 2019-08-22 DIAGNOSIS — R6510 Systemic inflammatory response syndrome (SIRS) of non-infectious origin without acute organ dysfunction: Secondary | ICD-10-CM | POA: Diagnosis not present

## 2019-08-22 DIAGNOSIS — E785 Hyperlipidemia, unspecified: Secondary | ICD-10-CM | POA: Diagnosis not present

## 2019-08-22 DIAGNOSIS — Z7401 Bed confinement status: Secondary | ICD-10-CM | POA: Diagnosis not present

## 2019-08-22 DIAGNOSIS — Z23 Encounter for immunization: Secondary | ICD-10-CM | POA: Diagnosis not present

## 2019-08-22 DIAGNOSIS — Z79899 Other long term (current) drug therapy: Secondary | ICD-10-CM | POA: Diagnosis not present

## 2019-08-22 DIAGNOSIS — E860 Dehydration: Secondary | ICD-10-CM | POA: Diagnosis not present

## 2019-08-22 DIAGNOSIS — R262 Difficulty in walking, not elsewhere classified: Secondary | ICD-10-CM | POA: Diagnosis not present

## 2019-08-22 DIAGNOSIS — I5032 Chronic diastolic (congestive) heart failure: Secondary | ICD-10-CM | POA: Diagnosis not present

## 2019-08-22 DIAGNOSIS — N185 Chronic kidney disease, stage 5: Secondary | ICD-10-CM | POA: Diagnosis not present

## 2019-08-22 DIAGNOSIS — K5900 Constipation, unspecified: Secondary | ICD-10-CM | POA: Diagnosis not present

## 2019-08-22 DIAGNOSIS — Z743 Need for continuous supervision: Secondary | ICD-10-CM | POA: Diagnosis not present

## 2019-08-22 DIAGNOSIS — I951 Orthostatic hypotension: Secondary | ICD-10-CM | POA: Diagnosis not present

## 2019-08-22 DIAGNOSIS — I4891 Unspecified atrial fibrillation: Secondary | ICD-10-CM | POA: Diagnosis not present

## 2019-08-22 DIAGNOSIS — E1169 Type 2 diabetes mellitus with other specified complication: Secondary | ICD-10-CM | POA: Diagnosis not present

## 2019-08-22 DIAGNOSIS — I770 Arteriovenous fistula, acquired: Secondary | ICD-10-CM | POA: Diagnosis not present

## 2019-08-22 DIAGNOSIS — R5381 Other malaise: Secondary | ICD-10-CM | POA: Diagnosis not present

## 2019-08-22 DIAGNOSIS — N179 Acute kidney failure, unspecified: Secondary | ICD-10-CM | POA: Diagnosis not present

## 2019-08-22 DIAGNOSIS — N3281 Overactive bladder: Secondary | ICD-10-CM | POA: Diagnosis not present

## 2019-08-22 DIAGNOSIS — R531 Weakness: Secondary | ICD-10-CM | POA: Diagnosis not present

## 2019-08-22 DIAGNOSIS — I132 Hypertensive heart and chronic kidney disease with heart failure and with stage 5 chronic kidney disease, or end stage renal disease: Secondary | ICD-10-CM | POA: Diagnosis not present

## 2019-08-22 DIAGNOSIS — R109 Unspecified abdominal pain: Secondary | ICD-10-CM | POA: Diagnosis not present

## 2019-08-22 DIAGNOSIS — G63 Polyneuropathy in diseases classified elsewhere: Secondary | ICD-10-CM | POA: Diagnosis not present

## 2019-08-22 DIAGNOSIS — G4700 Insomnia, unspecified: Secondary | ICD-10-CM | POA: Diagnosis not present

## 2019-08-22 DIAGNOSIS — I509 Heart failure, unspecified: Secondary | ICD-10-CM | POA: Diagnosis not present

## 2019-08-22 DIAGNOSIS — R69 Illness, unspecified: Secondary | ICD-10-CM | POA: Diagnosis not present

## 2019-08-22 DIAGNOSIS — E1122 Type 2 diabetes mellitus with diabetic chronic kidney disease: Secondary | ICD-10-CM | POA: Diagnosis not present

## 2019-08-22 DIAGNOSIS — R55 Syncope and collapse: Secondary | ICD-10-CM | POA: Diagnosis not present

## 2019-08-26 DIAGNOSIS — E1169 Type 2 diabetes mellitus with other specified complication: Secondary | ICD-10-CM | POA: Diagnosis not present

## 2019-08-26 DIAGNOSIS — E569 Vitamin deficiency, unspecified: Secondary | ICD-10-CM | POA: Diagnosis not present

## 2019-08-26 DIAGNOSIS — E785 Hyperlipidemia, unspecified: Secondary | ICD-10-CM | POA: Diagnosis not present

## 2019-08-26 DIAGNOSIS — R55 Syncope and collapse: Secondary | ICD-10-CM | POA: Diagnosis not present

## 2019-08-27 DIAGNOSIS — I5032 Chronic diastolic (congestive) heart failure: Secondary | ICD-10-CM | POA: Diagnosis not present

## 2019-08-27 DIAGNOSIS — N185 Chronic kidney disease, stage 5: Secondary | ICD-10-CM | POA: Diagnosis not present

## 2019-08-27 DIAGNOSIS — I110 Hypertensive heart disease with heart failure: Secondary | ICD-10-CM | POA: Diagnosis not present

## 2019-08-27 DIAGNOSIS — E1122 Type 2 diabetes mellitus with diabetic chronic kidney disease: Secondary | ICD-10-CM | POA: Diagnosis not present

## 2019-09-04 DIAGNOSIS — I951 Orthostatic hypotension: Secondary | ICD-10-CM | POA: Diagnosis not present

## 2019-09-04 DIAGNOSIS — I110 Hypertensive heart disease with heart failure: Secondary | ICD-10-CM | POA: Diagnosis not present

## 2019-09-04 DIAGNOSIS — N185 Chronic kidney disease, stage 5: Secondary | ICD-10-CM | POA: Diagnosis not present

## 2019-09-04 DIAGNOSIS — E1122 Type 2 diabetes mellitus with diabetic chronic kidney disease: Secondary | ICD-10-CM | POA: Diagnosis not present

## 2019-09-06 DIAGNOSIS — N185 Chronic kidney disease, stage 5: Secondary | ICD-10-CM | POA: Diagnosis not present

## 2019-09-06 DIAGNOSIS — E1122 Type 2 diabetes mellitus with diabetic chronic kidney disease: Secondary | ICD-10-CM | POA: Diagnosis not present

## 2019-09-06 DIAGNOSIS — I110 Hypertensive heart disease with heart failure: Secondary | ICD-10-CM | POA: Diagnosis not present

## 2019-09-06 DIAGNOSIS — I951 Orthostatic hypotension: Secondary | ICD-10-CM | POA: Diagnosis not present

## 2019-09-14 DIAGNOSIS — R52 Pain, unspecified: Secondary | ICD-10-CM | POA: Diagnosis not present

## 2019-09-14 DIAGNOSIS — I10 Essential (primary) hypertension: Secondary | ICD-10-CM | POA: Diagnosis not present

## 2019-09-14 DIAGNOSIS — E1169 Type 2 diabetes mellitus with other specified complication: Secondary | ICD-10-CM

## 2019-09-14 DIAGNOSIS — D62 Acute posthemorrhagic anemia: Secondary | ICD-10-CM | POA: Diagnosis not present

## 2019-09-14 DIAGNOSIS — Z794 Long term (current) use of insulin: Secondary | ICD-10-CM | POA: Diagnosis not present

## 2019-09-14 DIAGNOSIS — N3091 Cystitis, unspecified with hematuria: Secondary | ICD-10-CM | POA: Diagnosis not present

## 2019-09-14 DIAGNOSIS — E119 Type 2 diabetes mellitus without complications: Secondary | ICD-10-CM | POA: Diagnosis not present

## 2019-09-14 DIAGNOSIS — D494 Neoplasm of unspecified behavior of bladder: Secondary | ICD-10-CM | POA: Diagnosis not present

## 2019-09-14 DIAGNOSIS — D631 Anemia in chronic kidney disease: Secondary | ICD-10-CM | POA: Diagnosis not present

## 2019-09-14 DIAGNOSIS — B965 Pseudomonas (aeruginosa) (mallei) (pseudomallei) as the cause of diseases classified elsewhere: Secondary | ICD-10-CM | POA: Diagnosis not present

## 2019-09-14 DIAGNOSIS — E872 Acidosis: Secondary | ICD-10-CM | POA: Diagnosis not present

## 2019-09-14 DIAGNOSIS — C672 Malignant neoplasm of lateral wall of bladder: Secondary | ICD-10-CM | POA: Diagnosis not present

## 2019-09-14 DIAGNOSIS — D696 Thrombocytopenia, unspecified: Secondary | ICD-10-CM | POA: Diagnosis not present

## 2019-09-14 DIAGNOSIS — E1122 Type 2 diabetes mellitus with diabetic chronic kidney disease: Secondary | ICD-10-CM | POA: Diagnosis not present

## 2019-09-14 DIAGNOSIS — N329 Bladder disorder, unspecified: Secondary | ICD-10-CM | POA: Diagnosis not present

## 2019-09-14 DIAGNOSIS — G894 Chronic pain syndrome: Secondary | ICD-10-CM | POA: Diagnosis not present

## 2019-09-14 DIAGNOSIS — K5900 Constipation, unspecified: Secondary | ICD-10-CM | POA: Diagnosis not present

## 2019-09-14 DIAGNOSIS — N139 Obstructive and reflux uropathy, unspecified: Secondary | ICD-10-CM | POA: Diagnosis not present

## 2019-09-14 DIAGNOSIS — K5730 Diverticulosis of large intestine without perforation or abscess without bleeding: Secondary | ICD-10-CM | POA: Diagnosis not present

## 2019-09-14 DIAGNOSIS — B3749 Other urogenital candidiasis: Secondary | ICD-10-CM | POA: Diagnosis not present

## 2019-09-14 DIAGNOSIS — E860 Dehydration: Secondary | ICD-10-CM | POA: Diagnosis not present

## 2019-09-14 DIAGNOSIS — R58 Hemorrhage, not elsewhere classified: Secondary | ICD-10-CM | POA: Diagnosis not present

## 2019-09-14 DIAGNOSIS — N3001 Acute cystitis with hematuria: Secondary | ICD-10-CM | POA: Diagnosis not present

## 2019-09-14 DIAGNOSIS — I120 Hypertensive chronic kidney disease with stage 5 chronic kidney disease or end stage renal disease: Secondary | ICD-10-CM | POA: Diagnosis not present

## 2019-09-14 DIAGNOSIS — N3011 Interstitial cystitis (chronic) with hematuria: Secondary | ICD-10-CM | POA: Diagnosis not present

## 2019-09-14 DIAGNOSIS — M1990 Unspecified osteoarthritis, unspecified site: Secondary | ICD-10-CM | POA: Diagnosis not present

## 2019-09-14 DIAGNOSIS — N185 Chronic kidney disease, stage 5: Secondary | ICD-10-CM | POA: Diagnosis not present

## 2019-09-14 DIAGNOSIS — E440 Moderate protein-calorie malnutrition: Secondary | ICD-10-CM | POA: Diagnosis not present

## 2019-09-14 DIAGNOSIS — E785 Hyperlipidemia, unspecified: Secondary | ICD-10-CM | POA: Diagnosis not present

## 2019-09-14 DIAGNOSIS — M79621 Pain in right upper arm: Secondary | ICD-10-CM | POA: Diagnosis not present

## 2019-09-14 DIAGNOSIS — N179 Acute kidney failure, unspecified: Secondary | ICD-10-CM | POA: Diagnosis not present

## 2019-09-14 DIAGNOSIS — M7989 Other specified soft tissue disorders: Secondary | ICD-10-CM | POA: Diagnosis not present

## 2019-09-14 DIAGNOSIS — G4700 Insomnia, unspecified: Secondary | ICD-10-CM | POA: Diagnosis not present

## 2019-09-14 DIAGNOSIS — Z743 Need for continuous supervision: Secondary | ICD-10-CM | POA: Diagnosis not present

## 2019-09-14 DIAGNOSIS — Z452 Encounter for adjustment and management of vascular access device: Secondary | ICD-10-CM | POA: Diagnosis not present

## 2019-09-14 DIAGNOSIS — Z79899 Other long term (current) drug therapy: Secondary | ICD-10-CM | POA: Diagnosis not present

## 2019-09-14 DIAGNOSIS — R310 Gross hematuria: Secondary | ICD-10-CM | POA: Diagnosis not present

## 2019-09-14 DIAGNOSIS — R109 Unspecified abdominal pain: Secondary | ICD-10-CM | POA: Diagnosis not present

## 2019-09-14 DIAGNOSIS — R1030 Lower abdominal pain, unspecified: Secondary | ICD-10-CM | POA: Diagnosis not present

## 2019-09-17 DIAGNOSIS — N329 Bladder disorder, unspecified: Secondary | ICD-10-CM | POA: Diagnosis not present

## 2019-09-17 DIAGNOSIS — R310 Gross hematuria: Secondary | ICD-10-CM | POA: Diagnosis not present

## 2019-10-09 ENCOUNTER — Encounter (HOSPITAL_COMMUNITY): Payer: Self-pay | Admitting: Family Medicine

## 2019-10-09 ENCOUNTER — Other Ambulatory Visit: Payer: Self-pay

## 2019-10-09 ENCOUNTER — Inpatient Hospital Stay (HOSPITAL_COMMUNITY)
Admission: AD | Admit: 2019-10-09 | Discharge: 2019-10-16 | DRG: 666 | Disposition: A | Discharge status: Skilled Nursing Facility | Payer: Medicare Other | Source: Other Acute Inpatient Hospital | Attending: Internal Medicine | Admitting: Internal Medicine

## 2019-10-09 DIAGNOSIS — R2681 Unsteadiness on feet: Secondary | ICD-10-CM | POA: Diagnosis not present

## 2019-10-09 DIAGNOSIS — R262 Difficulty in walking, not elsewhere classified: Secondary | ICD-10-CM | POA: Diagnosis not present

## 2019-10-09 DIAGNOSIS — E1129 Type 2 diabetes mellitus with other diabetic kidney complication: Secondary | ICD-10-CM | POA: Diagnosis present

## 2019-10-09 DIAGNOSIS — R531 Weakness: Secondary | ICD-10-CM | POA: Diagnosis not present

## 2019-10-09 DIAGNOSIS — Z79899 Other long term (current) drug therapy: Secondary | ICD-10-CM | POA: Diagnosis not present

## 2019-10-09 DIAGNOSIS — D62 Acute posthemorrhagic anemia: Secondary | ICD-10-CM | POA: Diagnosis present

## 2019-10-09 DIAGNOSIS — Z743 Need for continuous supervision: Secondary | ICD-10-CM | POA: Diagnosis not present

## 2019-10-09 DIAGNOSIS — Z7401 Bed confinement status: Secondary | ICD-10-CM | POA: Diagnosis not present

## 2019-10-09 DIAGNOSIS — N184 Chronic kidney disease, stage 4 (severe): Secondary | ICD-10-CM | POA: Diagnosis not present

## 2019-10-09 DIAGNOSIS — I10 Essential (primary) hypertension: Secondary | ICD-10-CM | POA: Diagnosis not present

## 2019-10-09 DIAGNOSIS — D696 Thrombocytopenia, unspecified: Secondary | ICD-10-CM | POA: Diagnosis present

## 2019-10-09 DIAGNOSIS — N319 Neuromuscular dysfunction of bladder, unspecified: Secondary | ICD-10-CM | POA: Diagnosis present

## 2019-10-09 DIAGNOSIS — R319 Hematuria, unspecified: Secondary | ICD-10-CM | POA: Diagnosis present

## 2019-10-09 DIAGNOSIS — Z8546 Personal history of malignant neoplasm of prostate: Secondary | ICD-10-CM

## 2019-10-09 DIAGNOSIS — N401 Enlarged prostate with lower urinary tract symptoms: Secondary | ICD-10-CM | POA: Diagnosis present

## 2019-10-09 DIAGNOSIS — B20 Human immunodeficiency virus [HIV] disease: Secondary | ICD-10-CM | POA: Diagnosis present

## 2019-10-09 DIAGNOSIS — F209 Schizophrenia, unspecified: Secondary | ICD-10-CM | POA: Diagnosis present

## 2019-10-09 DIAGNOSIS — Z794 Long term (current) use of insulin: Secondary | ICD-10-CM | POA: Diagnosis not present

## 2019-10-09 DIAGNOSIS — Z6833 Body mass index (BMI) 33.0-33.9, adult: Secondary | ICD-10-CM | POA: Diagnosis not present

## 2019-10-09 DIAGNOSIS — Z923 Personal history of irradiation: Secondary | ICD-10-CM | POA: Diagnosis not present

## 2019-10-09 DIAGNOSIS — E785 Hyperlipidemia, unspecified: Secondary | ICD-10-CM | POA: Diagnosis present

## 2019-10-09 DIAGNOSIS — Y842 Radiological procedure and radiotherapy as the cause of abnormal reaction of the patient, or of later complication, without mention of misadventure at the time of the procedure: Secondary | ICD-10-CM | POA: Diagnosis present

## 2019-10-09 DIAGNOSIS — D500 Iron deficiency anemia secondary to blood loss (chronic): Secondary | ICD-10-CM | POA: Diagnosis not present

## 2019-10-09 DIAGNOSIS — N3041 Irradiation cystitis with hematuria: Secondary | ICD-10-CM | POA: Diagnosis not present

## 2019-10-09 DIAGNOSIS — M6281 Muscle weakness (generalized): Secondary | ICD-10-CM | POA: Diagnosis not present

## 2019-10-09 DIAGNOSIS — R509 Fever, unspecified: Secondary | ICD-10-CM | POA: Diagnosis not present

## 2019-10-09 DIAGNOSIS — E669 Obesity, unspecified: Secondary | ICD-10-CM | POA: Diagnosis present

## 2019-10-09 DIAGNOSIS — Z66 Do not resuscitate: Secondary | ICD-10-CM | POA: Diagnosis present

## 2019-10-09 DIAGNOSIS — I129 Hypertensive chronic kidney disease with stage 1 through stage 4 chronic kidney disease, or unspecified chronic kidney disease: Secondary | ICD-10-CM | POA: Diagnosis present

## 2019-10-09 DIAGNOSIS — D631 Anemia in chronic kidney disease: Secondary | ICD-10-CM | POA: Diagnosis not present

## 2019-10-09 DIAGNOSIS — E860 Dehydration: Secondary | ICD-10-CM | POA: Diagnosis not present

## 2019-10-09 DIAGNOSIS — E1122 Type 2 diabetes mellitus with diabetic chronic kidney disease: Secondary | ICD-10-CM | POA: Diagnosis not present

## 2019-10-09 DIAGNOSIS — M2550 Pain in unspecified joint: Secondary | ICD-10-CM | POA: Diagnosis not present

## 2019-10-09 DIAGNOSIS — N185 Chronic kidney disease, stage 5: Secondary | ICD-10-CM | POA: Diagnosis not present

## 2019-10-09 DIAGNOSIS — Z20822 Contact with and (suspected) exposure to covid-19: Secondary | ICD-10-CM | POA: Diagnosis not present

## 2019-10-09 DIAGNOSIS — R310 Gross hematuria: Secondary | ICD-10-CM | POA: Diagnosis not present

## 2019-10-09 DIAGNOSIS — N139 Obstructive and reflux uropathy, unspecified: Secondary | ICD-10-CM | POA: Diagnosis not present

## 2019-10-09 DIAGNOSIS — E7849 Other hyperlipidemia: Secondary | ICD-10-CM | POA: Diagnosis not present

## 2019-10-09 DIAGNOSIS — R109 Unspecified abdominal pain: Secondary | ICD-10-CM | POA: Diagnosis not present

## 2019-10-09 DIAGNOSIS — N179 Acute kidney failure, unspecified: Secondary | ICD-10-CM | POA: Diagnosis not present

## 2019-10-09 HISTORY — DX: Schizophrenia, unspecified: F20.9

## 2019-10-09 HISTORY — DX: Anemia, unspecified: D64.9

## 2019-10-10 ENCOUNTER — Encounter (HOSPITAL_COMMUNITY): Payer: Self-pay | Admitting: Family Medicine

## 2019-10-10 ENCOUNTER — Other Ambulatory Visit: Payer: Self-pay

## 2019-10-10 DIAGNOSIS — D62 Acute posthemorrhagic anemia: Secondary | ICD-10-CM | POA: Diagnosis present

## 2019-10-10 DIAGNOSIS — E1129 Type 2 diabetes mellitus with other diabetic kidney complication: Secondary | ICD-10-CM

## 2019-10-10 DIAGNOSIS — I10 Essential (primary) hypertension: Secondary | ICD-10-CM | POA: Diagnosis present

## 2019-10-10 DIAGNOSIS — D696 Thrombocytopenia, unspecified: Secondary | ICD-10-CM | POA: Diagnosis present

## 2019-10-10 DIAGNOSIS — N184 Chronic kidney disease, stage 4 (severe): Secondary | ICD-10-CM | POA: Diagnosis present

## 2019-10-10 DIAGNOSIS — R319 Hematuria, unspecified: Secondary | ICD-10-CM | POA: Diagnosis present

## 2019-10-10 LAB — COMPREHENSIVE METABOLIC PANEL
ALT: 26 U/L (ref 0–44)
AST: 24 U/L (ref 15–41)
Albumin: 1.7 g/dL — ABNORMAL LOW (ref 3.5–5.0)
Alkaline Phosphatase: 53 U/L (ref 38–126)
Anion gap: 6 (ref 5–15)
BUN: 45 mg/dL — ABNORMAL HIGH (ref 8–23)
CO2: 22 mmol/L (ref 22–32)
Calcium: 7.5 mg/dL — ABNORMAL LOW (ref 8.9–10.3)
Chloride: 112 mmol/L — ABNORMAL HIGH (ref 98–111)
Creatinine, Ser: 3.27 mg/dL — ABNORMAL HIGH (ref 0.61–1.24)
GFR calc Af Amer: 20 mL/min — ABNORMAL LOW (ref >60)
GFR calc non Af Amer: 17 mL/min — ABNORMAL LOW (ref >60)
Glucose, Bld: 98 mg/dL (ref 70–99)
Potassium: 4.8 mmol/L (ref 3.5–5.1)
Sodium: 140 mmol/L (ref 135–145)
Total Bilirubin: 0.6 mg/dL (ref 0.3–1.2)
Total Protein: 4.2 g/dL — ABNORMAL LOW (ref 6.5–8.1)

## 2019-10-10 LAB — BASIC METABOLIC PANEL
Anion gap: 7 (ref 5–15)
BUN: 46 mg/dL — ABNORMAL HIGH (ref 8–23)
CO2: 22 mmol/L (ref 22–32)
Calcium: 7.6 mg/dL — ABNORMAL LOW (ref 8.9–10.3)
Chloride: 112 mmol/L — ABNORMAL HIGH (ref 98–111)
Creatinine, Ser: 3.39 mg/dL — ABNORMAL HIGH (ref 0.61–1.24)
GFR calc Af Amer: 19 mL/min — ABNORMAL LOW (ref >60)
GFR calc non Af Amer: 17 mL/min — ABNORMAL LOW (ref >60)
Glucose, Bld: 99 mg/dL (ref 70–99)
Potassium: 4.9 mmol/L (ref 3.5–5.1)
Sodium: 141 mmol/L (ref 135–145)

## 2019-10-10 LAB — CBC WITH DIFFERENTIAL/PLATELET
Abs Immature Granulocytes: 0.09 10*3/uL — ABNORMAL HIGH (ref 0.00–0.07)
Basophils Absolute: 0 10*3/uL (ref 0.0–0.1)
Basophils Relative: 1 %
Eosinophils Absolute: 0.4 10*3/uL (ref 0.0–0.5)
Eosinophils Relative: 7 %
HCT: 22.4 % — ABNORMAL LOW (ref 39.0–52.0)
Hemoglobin: 7.2 g/dL — ABNORMAL LOW (ref 13.0–17.0)
Immature Granulocytes: 2 %
Lymphocytes Relative: 19 %
Lymphs Abs: 1.2 10*3/uL (ref 0.7–4.0)
MCH: 31.6 pg (ref 26.0–34.0)
MCHC: 32.1 g/dL (ref 30.0–36.0)
MCV: 98.2 fL (ref 80.0–100.0)
Monocytes Absolute: 0.6 10*3/uL (ref 0.1–1.0)
Monocytes Relative: 9 %
Neutro Abs: 3.9 10*3/uL (ref 1.7–7.7)
Neutrophils Relative %: 62 %
Platelets: 133 10*3/uL — ABNORMAL LOW (ref 150–400)
RBC: 2.28 MIL/uL — ABNORMAL LOW (ref 4.22–5.81)
RDW: 15.2 % (ref 11.5–15.5)
WBC: 6.2 10*3/uL (ref 4.0–10.5)
nRBC: 0 % (ref 0.0–0.2)

## 2019-10-10 LAB — PROTIME-INR
INR: 1.1 (ref 0.8–1.2)
Prothrombin Time: 14.2 seconds (ref 11.4–15.2)

## 2019-10-10 LAB — CBC
HCT: 23.3 % — ABNORMAL LOW (ref 39.0–52.0)
Hemoglobin: 7.6 g/dL — ABNORMAL LOW (ref 13.0–17.0)
MCH: 31.8 pg (ref 26.0–34.0)
MCHC: 32.6 g/dL (ref 30.0–36.0)
MCV: 97.5 fL (ref 80.0–100.0)
Platelets: 135 10*3/uL — ABNORMAL LOW (ref 150–400)
RBC: 2.39 MIL/uL — ABNORMAL LOW (ref 4.22–5.81)
RDW: 15.3 % (ref 11.5–15.5)
WBC: 6.3 10*3/uL (ref 4.0–10.5)
nRBC: 0 % (ref 0.0–0.2)

## 2019-10-10 LAB — GLUCOSE, CAPILLARY
Glucose-Capillary: 122 mg/dL — ABNORMAL HIGH (ref 70–99)
Glucose-Capillary: 147 mg/dL — ABNORMAL HIGH (ref 70–99)

## 2019-10-10 LAB — SARS CORONAVIRUS 2 (TAT 6-24 HRS): SARS Coronavirus 2: NEGATIVE

## 2019-10-10 LAB — ABO/RH: ABO/RH(D): O POS

## 2019-10-10 MED ORDER — AMLODIPINE BESYLATE 10 MG PO TABS
10.0000 mg | ORAL_TABLET | Freq: Every day | ORAL | Status: DC
  Administered 2019-10-10 – 2019-10-16 (×7): 10 mg via ORAL
  Filled 2019-10-10 (×7): qty 1

## 2019-10-10 MED ORDER — ATORVASTATIN CALCIUM 20 MG PO TABS
20.0000 mg | ORAL_TABLET | Freq: Every day | ORAL | Status: DC
  Administered 2019-10-10 – 2019-10-16 (×6): 20 mg via ORAL
  Filled 2019-10-10 (×6): qty 1

## 2019-10-10 MED ORDER — OMEGA-3-ACID ETHYL ESTERS 1 G PO CAPS
1.0000 g | ORAL_CAPSULE | Freq: Every day | ORAL | Status: DC
  Administered 2019-10-10 – 2019-10-16 (×6): 1 g via ORAL
  Filled 2019-10-10 (×6): qty 1

## 2019-10-10 MED ORDER — TAMSULOSIN HCL 0.4 MG PO CAPS
0.4000 mg | ORAL_CAPSULE | Freq: Every day | ORAL | Status: DC
  Administered 2019-10-10 – 2019-10-16 (×7): 0.4 mg via ORAL
  Filled 2019-10-10 (×6): qty 1

## 2019-10-10 MED ORDER — TRAZODONE HCL 100 MG PO TABS
300.0000 mg | ORAL_TABLET | Freq: Every day | ORAL | Status: DC
  Administered 2019-10-10 – 2019-10-15 (×6): 300 mg via ORAL
  Filled 2019-10-10 (×7): qty 3

## 2019-10-10 MED ORDER — ONDANSETRON HCL 4 MG/2ML IJ SOLN
4.0000 mg | Freq: Four times a day (QID) | INTRAMUSCULAR | Status: DC | PRN
  Administered 2019-10-11: 4 mg via INTRAVENOUS

## 2019-10-10 MED ORDER — DULOXETINE HCL 60 MG PO CPEP
60.0000 mg | ORAL_CAPSULE | Freq: Every day | ORAL | Status: DC
  Administered 2019-10-10 – 2019-10-16 (×7): 60 mg via ORAL
  Filled 2019-10-10 (×7): qty 1

## 2019-10-10 MED ORDER — ZOLPIDEM TARTRATE 5 MG PO TABS
5.0000 mg | ORAL_TABLET | Freq: Every day | ORAL | Status: DC
  Administered 2019-10-10 – 2019-10-15 (×6): 5 mg via ORAL
  Filled 2019-10-10 (×6): qty 1

## 2019-10-10 MED ORDER — RISPERIDONE 1 MG PO TABS
1.0000 mg | ORAL_TABLET | Freq: Every day | ORAL | Status: DC
  Administered 2019-10-10 – 2019-10-15 (×6): 1 mg via ORAL
  Filled 2019-10-10 (×6): qty 1

## 2019-10-10 MED ORDER — BUPROPION HCL ER (XL) 150 MG PO TB24
150.0000 mg | ORAL_TABLET | Freq: Every day | ORAL | Status: DC
  Administered 2019-10-10 – 2019-10-16 (×7): 150 mg via ORAL
  Filled 2019-10-10 (×7): qty 1

## 2019-10-10 MED ORDER — CALCIUM CARBONATE 1250 (500 CA) MG PO TABS
1250.0000 mg | ORAL_TABLET | Freq: Every day | ORAL | Status: DC
  Administered 2019-10-10 – 2019-10-15 (×5): 1250 mg via ORAL
  Filled 2019-10-10 (×5): qty 1

## 2019-10-10 MED ORDER — ONDANSETRON HCL 4 MG PO TABS: 4.0000 mg | ORAL_TABLET | Freq: Four times a day (QID) | ORAL | Status: DC | PRN

## 2019-10-10 MED ORDER — ACETAMINOPHEN 325 MG PO TABS
650.0000 mg | ORAL_TABLET | Freq: Four times a day (QID) | ORAL | Status: DC | PRN
  Administered 2019-10-10 – 2019-10-13 (×5): 650 mg via ORAL
  Filled 2019-10-10 (×5): qty 2

## 2019-10-10 MED ORDER — SODIUM CHLORIDE 0.9 % IV SOLN
2.0000 g | INTRAVENOUS | Status: DC
  Administered 2019-10-10 – 2019-10-11 (×2): 2 g via INTRAVENOUS
  Filled 2019-10-10 (×3): qty 2

## 2019-10-10 MED ORDER — ADULT MULTIVITAMIN W/MINERALS CH
1.0000 | ORAL_TABLET | Freq: Every day | ORAL | Status: DC
  Administered 2019-10-12 – 2019-10-16 (×5): 1 via ORAL
  Filled 2019-10-10 (×6): qty 1

## 2019-10-10 MED ORDER — KETOTIFEN FUMARATE 0.025 % OP SOLN: 1.0000 [drp] | Freq: Two times a day (BID) | OPHTHALMIC | Status: DC | PRN

## 2019-10-10 MED ORDER — DUTASTERIDE 0.5 MG PO CAPS
0.5000 mg | ORAL_CAPSULE | Freq: Every day | ORAL | Status: DC
  Administered 2019-10-10 – 2019-10-16 (×6): 0.5 mg via ORAL
  Filled 2019-10-10 (×7): qty 1

## 2019-10-10 MED ORDER — FESOTERODINE FUMARATE ER 8 MG PO TB24
8.0000 mg | ORAL_TABLET | Freq: Every day | ORAL | Status: DC
  Administered 2019-10-10 – 2019-10-16 (×6): 8 mg via ORAL
  Filled 2019-10-10 (×7): qty 1

## 2019-10-10 MED ORDER — MIRABEGRON ER 25 MG PO TB24
25.0000 mg | ORAL_TABLET | Freq: Every day | ORAL | Status: DC
  Administered 2019-10-10 – 2019-10-16 (×6): 25 mg via ORAL
  Filled 2019-10-10 (×7): qty 1

## 2019-10-10 MED ORDER — SODIUM CHLORIDE 0.9% FLUSH
10.0000 mL | Freq: Two times a day (BID) | INTRAVENOUS | Status: DC
  Administered 2019-10-10 – 2019-10-15 (×7): 10 mL

## 2019-10-10 MED ORDER — ACETAMINOPHEN 650 MG RE SUPP: 650.0000 mg | Freq: Four times a day (QID) | RECTAL | Status: DC | PRN

## 2019-10-10 MED ORDER — INSULIN ASPART PROT & ASPART (70-30 MIX) 100 UNIT/ML ~~LOC~~ SUSP
80.0000 [IU] | Freq: Every day | SUBCUTANEOUS | Status: DC
  Administered 2019-10-12 – 2019-10-16 (×3): 80 [IU] via SUBCUTANEOUS
  Filled 2019-10-10: qty 10

## 2019-10-10 MED ORDER — SODIUM CHLORIDE 0.9 % IV SOLN
1.0000 g | INTRAVENOUS | Status: DC
  Administered 2019-10-10: 1 g via INTRAVENOUS
  Filled 2019-10-10: qty 1

## 2019-10-10 MED ORDER — SODIUM CHLORIDE 0.9% FLUSH: 10.0000 mL | INTRAVENOUS | Status: DC | PRN

## 2019-10-10 MED ORDER — EMTRICITAB-RILPIVIR-TENOFOV AF 200-25-25 MG PO TABS
1.0000 | ORAL_TABLET | Freq: Every day | ORAL | Status: DC
  Administered 2019-10-10 – 2019-10-16 (×6): 1 via ORAL
  Filled 2019-10-10 (×8): qty 1

## 2019-10-10 MED ORDER — CHLORHEXIDINE GLUCONATE CLOTH 2 % EX PADS
6.0000 | MEDICATED_PAD | Freq: Every day | CUTANEOUS | Status: DC
  Administered 2019-10-10 – 2019-10-16 (×6): 6 via TOPICAL

## 2019-10-10 MED ORDER — INSULIN ASPART 100 UNIT/ML ~~LOC~~ SOLN
0.0000 [IU] | SUBCUTANEOUS | Status: DC
  Administered 2019-10-10 – 2019-10-12 (×3): 1 [IU] via SUBCUTANEOUS
  Administered 2019-10-12: 2 [IU] via SUBCUTANEOUS

## 2019-10-10 NOTE — Progress Notes (Signed)
Pharmacy Antibiotic Note  Darrell [name] is a 79 y.o. male admitted on 10/09/2019 with UTI.  Pharmacy has been consulted for cefepime dosing. PMH significant for Recent pseudomonas UTI and Stage V CKD with CrCl<30.  Plan: Increase Cefepime 2g IV q24 per current renal function & recent infection with pseudomonas  Weight: 229 lb 4.5 oz (104 kg)(aug 2020 weight)  Temp (24hrs), Avg:97.8 F (36.6 C), Min:97.6 F (36.4 C), Max:98 F (36.7 C)  Recent Labs  Lab 10/10/19 0021  WBC 6.2  CREATININE 3.27*    Estimated Creatinine Clearance: 22.8 mL/min (A) (by C-G formula based on SCr of 3.27 mg/dL (H)).    Allergies  Allergen Reactions  . Percodan [Oxycodone-Aspirin] Other (See Comments)    Prickly feeling in hands, arms and torso    Thank you for allowing pharmacy to be a part of this patient's care.  Netta Cedars, PharmD, BCPS 10/10/2019 9:33 AM

## 2019-10-10 NOTE — Progress Notes (Signed)
Patient has been irrigated twice today with moderate amount of clotting. Will continue CBI

## 2019-10-10 NOTE — H&P (Addendum)
History and Physical    Darrell [name] DDU:[id number] DOB: 10-16-1940 DOA: 10/09/2019  PCP: Cher Nakai, MD  Patient coming from: Patient was transferred from [facility].  Chief Complaint: Persistent hematuria.  HPI: Darrell [name] is a 79 y.o. male with history of chronic kidney disease stage IV status post AV fistula placement, diabetes mellitus, HIV, hypertension, chronic anemia who was admitted at [facility] the third week of December 2020 with complaints of abdominal pain and hematuria.  CT pelvis at that time showed large clot within the urinary bladder.  Urology was consulted and patient underwent cystoscopy on September 17, 2019 concerning for infiltrating bladder tumor or hemorrhagic cystitis.  Underwent fulguration and started on continuous bladder irrigation.  Despite which patient still had hematuria and had to be taken back to the OR on September 25, 2019 and underwent biopsy and resection and fulguration of the necrotic bladder tumor.  Pathology results showed no malignancy.  Due to persistent hematuria radiation oncology was consulted and at the time radiation oncology indicated no reason for radiation.  Patient was taken back to the OR on October 04, 2019 and underwent transurethral resection of the necrotic bladder tumor and transurethral resection of the adjacent prostate tissue.  Due to persistent hematuria Dr. Gilford Rile urologist on-call at [facility] was contacted and patient was transferred to further management.  During the stay at [facility] patient has received total of 12 units of PRBC.  Also patient had received antibiotics for Pseudomonas UTI.  Has had neurogenic bladder for which Foley was placed.  Patient's creatinine was around 3.5 hemoglobin was around 7.  In the last hemoglobin A1c on August 2020 was 4.8.  ED Course: Patient is a direct admit.  Review of Systems: As per HPI, rest all negative.   Past Medical History:  Diagnosis Date  . Anemia     . Anxiety   . Arthritis   . Cancer [facility])    prostate cancer  . Cataract   . Chronic kidney disease    Stage 4  . Depression   . Diabetes mellitus without complication ([place])   . HIV (human immunodeficiency virus infection) ([place])   . HIV infection ([place])   . Hyperlipidemia   . Hypertension   . Pneumonia 2018  . Schizophrenia ([place])   . Substance abuse [facility])     Past Surgical History:  Procedure Laterality Date  . AV FISTULA PLACEMENT Left 05/01/2019   Procedure: ARTERIOVENOUS (AV) FISTULA CREATION;  Surgeon: Angelia Mould, MD;  Location: [place];  Service: Vascular;  Laterality: Left;  . COLONOSCOPY    . EYE SURGERY    . JOINT REPLACEMENT     both knees     reports that he has never smoked. He has never used smokeless tobacco. He reports previous alcohol use. He reports that he does not use drugs.  Allergies  Allergen Reactions  . Percodan [Oxycodone-Aspirin] Other (See Comments)    Prickly feeling in hands, arms and torso    Family History  Family history unknown: Yes    Prior to Admission medications   Medication Sig Start Date End Date Taking? Authorizing Provider  amLODipine (NORVASC) 10 MG tablet Take 10 mg by mouth daily.   Yes [provider]  atorvastatin (LIPITOR) 20 MG tablet TAKE ONE TABLET BY MOUTH EVERY DAY Patient taking differently: Take 20 mg by mouth daily.  10/02/12  Yes Tommy Medal, Lavell Islam, MD  buPROPion (WELLBUTRIN XL) 150 MG 24 hr tablet Take  150 mg by mouth daily. 06/26/19  Yes [provider]  Calcium Carbonate (CALCIUM 600 PO) Take 600 mg by mouth daily.   Yes [provider]  DULoxetine (CYMBALTA) 60 MG capsule Take 60 mg by mouth daily.   Yes [provider]  dutasteride (AVODART) 0.5 MG capsule Take 0.5 mg by mouth daily.   Yes [provider]  emtricitabine-rilpivir-tenofovir AF (ODEFSEY) 200-25-25 MG TABS tablet Take 1 tablet by mouth daily. 04/26/16  Yes [provider]  fesoterodine  (TOVIAZ) 8 MG TB24 tablet Take 8 mg by mouth daily.   Yes [provider]  FIBER PO Take 1 tablet by mouth daily.   Yes [provider]  insulin NPH-regular Human (NOVOLIN 70/30) (70-30) 100 UNIT/ML injection Inject 100 Units into the skin daily.    Yes [provider]  ketotifen (ZADITOR) 0.025 % ophthalmic solution Place 1 drop into both eyes 2 (two) times daily as needed (irritated eyes).   Yes [provider]  lisinopril (ZESTRIL) 40 MG tablet Take 40 mg by mouth daily. 06/26/19  Yes [provider]  Melatonin 5 MG TABS Take 10 mg by mouth at bedtime.    Yes [provider]  mirabegron ER (MYRBETRIQ) 25 MG TB24 tablet Take 25 mg by mouth daily.   Yes [provider]  Multiple Vitamin (MULTIVITAMIN WITH MINERALS) TABS tablet Take 1 tablet by mouth daily.   Yes [provider]  Omega-3 Fatty Acids (FISH OIL) 1000 MG CAPS Take 1,000 mg by mouth 2 (two) times a day.   Yes [provider]  risperiDONE (RISPERDAL) 1 MG tablet Take 1 mg by mouth at bedtime.   Yes [provider]  Tamsulosin HCl (FLOMAX) 0.4 MG CAPS TAKE ONE CAPSULE BY MOUTH EVERY DAY Patient taking differently: Take 0.4 mg by mouth daily.  10/20/11  Yes Tommy Medal, Lavell Islam, MD  trazodone (DESYREL) 300 MG tablet Take 300 mg by mouth at bedtime.   Yes [provider]  zolpidem (AMBIEN) 10 MG tablet Take 10 mg by mouth at bedtime.    Yes [provider]  traMADol (ULTRAM) 50 MG tablet Take 1 tablet (50 mg total) by mouth every 6 (six) hours as needed. Patient not taking: Reported on 10/09/2019 05/01/19   Angelia Mould, MD    Physical Exam: Constitutional: Moderately built and nourished. Vitals:   10/09/19 2249  BP: 130/72  Pulse: 78  Resp: 18  Temp: 97.6 F (36.4 C)  TempSrc: Oral  SpO2: 100%   Eyes: Anicteric no pallor. ENMT: No discharge from the ears eyes nose or mouth. Neck: No mass felt.  No neck  rigidity. Respiratory: No rhonchi or crepitations. Cardiovascular: S1-S2 heard. Abdomen: Soft nontender bowel sounds present. Musculoskeletal: No edema. Skin: No rash. Neurologic: Alert awake oriented time place and person.  Moves all extremities. Psychiatric: Appears normal.   Labs on Admission: I have personally reviewed following labs and imaging studies  CBC: Recent Labs  Lab 10/10/19 0021  WBC 6.2  NEUTROABS 3.9  HGB 7.2*  HCT 22.4*  MCV 98.2  PLT 765*   Basic Metabolic Panel: Recent Labs  Lab 10/10/19 0021  NA 140  K 4.8  CL 112*  CO2 22  GLUCOSE 98  BUN 45*  CREATININE 3.27*  CALCIUM 7.5*   GFR: CrCl cannot be calculated (Unknown ideal weight.). Liver Function Tests: Recent Labs  Lab 10/10/19 0021  AST 24  ALT 26  ALKPHOS 53  BILITOT 0.6  PROT  4.2*  ALBUMIN 1.7*   No results for input(s): LIPASE, AMYLASE in the last 168 hours. No results for input(s): AMMONIA in the last 168 hours. Coagulation Profile: Recent Labs  Lab 10/10/19 0021  INR 1.1   Cardiac Enzymes: No results for input(s): CKTOTAL, CKMB, CKMBINDEX, TROPONINI in the last 168 hours. BNP (last 3 results) No results for input(s): PROBNP in the last 8760 hours. HbA1C: No results for input(s): HGBA1C in the last 72 hours. CBG: No results for input(s): GLUCAP in the last 168 hours. Lipid Profile: No results for input(s): CHOL, HDL, LDLCALC, TRIG, CHOLHDL, LDLDIRECT in the last 72 hours. Thyroid Function Tests: No results for input(s): TSH, T4TOTAL, FREET4, T3FREE, THYROIDAB in the last 72 hours. Anemia Panel: No results for input(s): VITAMINB12, FOLATE, FERRITIN, TIBC, IRON, RETICCTPCT in the last 72 hours. Urine analysis: No results found for: COLORURINE, APPEARANCEUR, LABSPEC, PHURINE, GLUCOSEU, HGBUR, BILIRUBINUR, KETONESUR, PROTEINUR, UROBILINOGEN, NITRITE, LEUKOCYTESUR Sepsis Labs: @LABRCNTIP (procalcitonin:4,lacticidven:4) )No results found for this or any previous visit  (from the past 240 hour(s)).   Radiological Exams on Admission: No results found.   Assessment/Plan Principal Problem:   Hematuria Active Problems:   CKD (chronic kidney disease) stage 4, GFR 15-29 ml/min (HCC)   DM (diabetes mellitus), type 2 with renal complications (HCC)   Acute blood loss anemia   Essential hypertension   Thrombocytopenia (HCC)    1. Gross hematuria with necrotic bladder mass status post recent cystoscopy and fulguration and biopsy, biopsy did not show any pregnancy per the report.  On continuous bladder irrigation.  Will consult urologist.  Presently we will keep patient on n.p.o. except medications. 2. Recent Pseudomonas UTI -for now we will keep patient on empiric antibiotics. 3. Diabetes mellitus type 2 on 100 units of insulin in the morning.  To have decreased to 80 units since patient may have to go for procedure.  On sliding scale coverage. 4. Acute blood loss anemia secondary to hematuria follow CBC. 5. Hypertension we will continue amlodipine hold lisinopril due to worsening renal function as needed IV hydralazine. 6. History of HIV last CD4 count not known continue antiretroviral check CD4 count with next blood draw. 7. History of previous prostate cancer. 8. In addition patient is also on Risperdal trazodone.  Patient will need inpatient status for further regimen.  Covid test is pending.   DVT prophylaxis: SCDs due to hematuria. Code Status: DNR confirmed with patient. Family Communication: Discussed with patient. Disposition Plan: To be determined. Consults called: Will need to consult urology. Admission status: Inpatient.   Rise Patience MD [facility] Pager [phone].  If 7PM-7AM, please contact night-coverage [email or web address] Password [email or web address]  10/10/2019, 2:00 AM

## 2019-10-10 NOTE — Plan of Care (Signed)
  Problem: Education: Goal: Knowledge of General Education information will improve Description Including pain rating scale, medication(s)/side effects and non-pharmacologic comfort measures Outcome: Progressing   

## 2019-10-10 NOTE — Progress Notes (Signed)
Pharmacy Antibiotic Note  Darrell [name] is a 79 y.o. male admitted on 10/09/2019 with UTI.  Pharmacy has been consulted for cefepime dosing.  Plan: Cefepime 1g IV q24 per current renal function  Weight: 229 lb 4.5 oz (104 kg)(aug 2020 weight)  Temp (24hrs), Avg:97.6 F (36.4 C), Min:97.6 F (36.4 C), Max:97.6 F (36.4 C)  Recent Labs  Lab 10/10/19 0021  WBC 6.2  CREATININE 3.27*    Estimated Creatinine Clearance: 22.8 mL/min (A) (by C-G formula based on SCr of 3.27 mg/dL (H)).    Allergies  Allergen Reactions  . Percodan [Oxycodone-Aspirin] Other (See Comments)    Prickly feeling in hands, arms and torso    Thank you for allowing pharmacy to be a part of this patient's care.  Kara Mead 10/10/2019 2:24 AM

## 2019-10-10 NOTE — Consult Note (Signed)
Urology Consult   Physician requesting consult: Dr. Tawanna Solo  Reason for consult: Hematuria  History of Present Illness: Darrell [name] is a 79 y.o. patient who is a very poor historian who was transferred to [facility] due to persistent hematuria.  Per his outside records, he has a complex medical history of poorly controlled diabetes, HIV, hypertension, and chronic anemia as well as stage IV chronic kidney disease status post AV fistula replacement.  His baseline serum creatinine is between 3 and 4.  By report, he was treated for a clinical stage T1c, Gleason 6 prostate cancer with external beam radiation therapy in the [facility] area completed in April 2016.  The patient was somewhat unclear about this but seem to recall that he had radiation therapy upon further prompting.  He has recently presented to [facility] in December and was noted to have significant hematuria requiring multiple blood transfusions during his hospitalization.  He was seen by Dr. Comer Locket who had recommended CT imaging.  He underwent a CT scan by report on 09/14/2027 that demonstrated hyperdense material within the bladder consistent with probable clot.  There was concern of perivesical stranding and he underwent a CT cystogram to rule out bladder perforation.  This was negative.  The patient also reportedly had a recent Pseudomonas urinary tract infection.  He was maintained on antibiotics and ultimately underwent cystoscopy in the operating room by Dr. Comer Locket on 09/17/2019.  Per his operative note, findings were consistent with cystitis versus tumor.  He underwent fulguration and biopsy.  Due to persistent/recurrent bleeding, he was up again taken to the operating room on 09/25/2019 and again underwent fulguration and further biopsy/resection.  Pathologic findings were consistent with chronic inflammation and necrosis with no malignancy identified.  Is unclear what his last PSA was.  He again underwent  operative intervention on 10/04/2019 with further fulguration.  He again required multiple blood transfusions in between these procedures.  Due to persistent concerns of hematuria despite CBI, Dr. Comer Locket had discussed further options and attempted to transfer him to [facility] who declined transfer.  There was subsequently discussion on 10/08/2019 with Dr. Lovena Neighbours in our group.  Despite recommendations, the urologist in [facility] apparently felt uncomfortable further addressing the situation and recommended transfer from hospitalist to hospitalist.  He finally presented to [facility] on 10/10/2019.  Currently, he denies any significant pain in his flank or abdomen.  His urine is completely clear on minimal CBI.  He is on chronic therapy with dutasteride presumably related to BPH.  He also takes Myrbetriq chronically.  His most recent serum creatinine is 3.4.  Past Medical History:  Diagnosis Date  . Anemia   . Anxiety   . Arthritis   . Cancer [facility])    prostate cancer  . Cataract   . Chronic kidney disease    Stage 4  . Depression   . Diabetes mellitus without complication ([place])   . HIV (human immunodeficiency virus infection) ([place])   . HIV infection ([place])   . Hyperlipidemia   . Hypertension   . Pneumonia 2018  . Schizophrenia ([place])   . Substance abuse [facility])     Past Surgical History:  Procedure Laterality Date  . AV FISTULA PLACEMENT Left 05/01/2019   Procedure: ARTERIOVENOUS (AV) FISTULA CREATION;  Surgeon: Angelia Mould, MD;  Location: [place];  Service: Vascular;  Laterality: Left;  . COLONOSCOPY    . EYE SURGERY    . JOINT REPLACEMENT  both knees    Current Hospital Medications:  Home Meds:  No current facility-administered medications on file prior to encounter.   Current Outpatient Medications on File Prior to Encounter  Medication Sig Dispense Refill  . amLODipine (NORVASC) 10 MG tablet Take 10 mg by mouth daily.    Marland Kitchen atorvastatin  (LIPITOR) 20 MG tablet TAKE ONE TABLET BY MOUTH EVERY DAY (Patient taking differently: Take 20 mg by mouth daily. ) 30 tablet 5  . buPROPion (WELLBUTRIN XL) 150 MG 24 hr tablet Take 150 mg by mouth daily.    . Calcium Carbonate (CALCIUM 600 PO) Take 600 mg by mouth daily.    . DULoxetine (CYMBALTA) 60 MG capsule Take 60 mg by mouth daily.    Marland Kitchen dutasteride (AVODART) 0.5 MG capsule Take 0.5 mg by mouth daily.    Marland Kitchen emtricitabine-rilpivir-tenofovir AF (ODEFSEY) 200-25-25 MG TABS tablet Take 1 tablet by mouth daily.    . fesoterodine (TOVIAZ) 8 MG TB24 tablet Take 8 mg by mouth daily.    Marland Kitchen FIBER PO Take 1 tablet by mouth daily.    . insulin NPH-regular Human (NOVOLIN 70/30) (70-30) 100 UNIT/ML injection Inject 100 Units into the skin daily.     Marland Kitchen ketotifen (ZADITOR) 0.025 % ophthalmic solution Place 1 drop into both eyes 2 (two) times daily as needed (irritated eyes).    Marland Kitchen lisinopril (ZESTRIL) 40 MG tablet Take 40 mg by mouth daily.    . Melatonin 5 MG TABS Take 10 mg by mouth at bedtime.     . mirabegron ER (MYRBETRIQ) 25 MG TB24 tablet Take 25 mg by mouth daily.    . Multiple Vitamin (MULTIVITAMIN WITH MINERALS) TABS tablet Take 1 tablet by mouth daily.    . Omega-3 Fatty Acids (FISH OIL) 1000 MG CAPS Take 1,000 mg by mouth 2 (two) times a day.    . risperiDONE (RISPERDAL) 1 MG tablet Take 1 mg by mouth at bedtime.    . Tamsulosin HCl (FLOMAX) 0.4 MG CAPS TAKE ONE CAPSULE BY MOUTH EVERY DAY (Patient taking differently: Take 0.4 mg by mouth daily. ) 30 capsule 6  . trazodone (DESYREL) 300 MG tablet Take 300 mg by mouth at bedtime.    Marland Kitchen zolpidem (AMBIEN) 10 MG tablet Take 10 mg by mouth at bedtime.     . traMADol (ULTRAM) 50 MG tablet Take 1 tablet (50 mg total) by mouth every 6 (six) hours as needed. (Patient not taking: Reported on 10/09/2019) 15 tablet 0     Scheduled Meds: . amLODipine  10 mg Oral Daily  . atorvastatin  20 mg Oral Daily  . buPROPion  150 mg Oral Daily  . calcium carbonate   1,250 mg Oral QAC breakfast  . Chlorhexidine Gluconate Cloth  6 each Topical Daily  . DULoxetine  60 mg Oral Daily  . dutasteride  0.5 mg Oral Daily  . emtricitabine-rilpivir-tenofovir AF  1 tablet Oral Daily  . fesoterodine  8 mg Oral Daily  . insulin aspart  0-9 Units Subcutaneous Q4H  . insulin aspart protamine- aspart  80 Units Subcutaneous Q breakfast  . mirabegron ER  25 mg Oral Daily  . multivitamin with minerals  1 tablet Oral Daily  . omega-3 acid ethyl esters  1 g Oral Daily  . risperiDONE  1 mg Oral QHS  . sodium chloride flush  10-40 mL Intracatheter Q12H  . tamsulosin  0.4 mg Oral Daily  . trazodone  300 mg Oral QHS  . zolpidem  5 mg  Oral QHS   Continuous Infusions: . ceFEPime (MAXIPIME) IV     PRN Meds:.acetaminophen **OR** acetaminophen, ketotifen, ondansetron **OR** ondansetron (ZOFRAN) IV, sodium chloride flush  Allergies:  Allergies  Allergen Reactions  . Percodan [Oxycodone-Aspirin] Other (See Comments)    Prickly feeling in hands, arms and torso    Family History  Family history unknown: Yes    Social History:  reports that he has never smoked. He has never used smokeless tobacco. He reports previous alcohol use. He reports that he does not use drugs.  ROS: A complete review of systems was performed.  All systems are negative except for pertinent findings as noted.  Physical Exam:  Vital signs in last 24 hours: Temp:  [97.6 F (36.4 C)-98.1 F (36.7 C)] 98.1 F (36.7 C) (01/13 1002) Pulse Rate:  [72-81] 81 (01/13 1002) Resp:  [18] 18 (01/13 1002) BP: (123-130)/(67-72) 128/67 (01/13 1002) SpO2:  [99 %-100 %] 99 % (01/13 1002) Weight:  [104 kg] 104 kg (01/13 0200) Constitutional:  Alert and oriented, No acute distress Cardiovascular: Regular rate and rhythm, No JVD Respiratory: Normal respiratory effort GI: Abdomen is soft, nontender, nondistended, no abdominal masses GU: No CVA tenderness, he has a three-way catheter in and his urine is  completely clear on minimal CBI. Lymphatic: No lymphadenopathy Neurologic: Grossly intact, no focal deficits  Laboratory Data:  Recent Labs    10/10/19 0021 10/10/19 0957  WBC 6.2 6.3  HGB 7.2* 7.6*  HCT 22.4* 23.3*  PLT 133* 135*    Recent Labs    10/10/19 0021 10/10/19 0957  NA 140 141  K 4.8 4.9  CL 112* 112*  GLUCOSE 98 99  BUN 45* 46*  CALCIUM 7.5* 7.6*  CREATININE 3.27* 3.39*     Results for orders placed or performed during the hospital encounter of 10/09/19 (from the past 24 hour(s))  ABO/Rh     Status: None   Collection Time: 10/10/19 12:12 AM  Result Value Ref Range   ABO/RH(D)      O POS Performed at [facility], [place] [address], [place], [place] [address]   Comprehensive metabolic panel     Status: Abnormal   Collection Time: 10/10/19 12:21 AM  Result Value Ref Range   Sodium 140 135 - 145 mmol/L   Potassium 4.8 3.5 - 5.1 mmol/L   Chloride 112 (H) 98 - 111 mmol/L   CO2 22 22 - 32 mmol/L   Glucose, Bld 98 70 - 99 mg/dL   BUN 45 (H) 8 - 23 mg/dL   Creatinine, Ser 3.27 (H) 0.61 - 1.24 mg/dL   Calcium 7.5 (L) 8.9 - 10.3 mg/dL   Total Protein 4.2 (L) 6.5 - 8.1 g/dL   Albumin 1.7 (L) 3.5 - 5.0 g/dL   AST 24 15 - 41 U/L   ALT 26 0 - 44 U/L   Alkaline Phosphatase 53 38 - 126 U/L   Total Bilirubin 0.6 0.3 - 1.2 mg/dL   GFR calc non Af Amer 17 (L) >60 mL/min   GFR calc Af Amer 20 (L) >60 mL/min   Anion gap 6 5 - 15  CBC with Differential/Platelet     Status: Abnormal   Collection Time: 10/10/19 12:21 AM  Result Value Ref Range   WBC 6.2 4.0 - 10.5 K/uL   RBC 2.28 (L) 4.22 - 5.81 MIL/uL   Hemoglobin 7.2 (L) 13.0 - 17.0 g/dL   HCT 22.4 (L) 39.0 - 52.0 %   MCV 98.2 80.0 -  100.0 fL   MCH 31.6 26.0 - 34.0 pg   MCHC 32.1 30.0 - 36.0 g/dL   RDW 15.2 11.5 - 15.5 %   Platelets 133 (L) 150 - 400 K/uL   nRBC 0.0 0.0 - 0.2 %   Neutrophils Relative % 62 %   Neutro Abs 3.9 1.7 - 7.7 K/uL   Lymphocytes Relative 19 %   Lymphs Abs 1.2 0.7 -  4.0 K/uL   Monocytes Relative 9 %   Monocytes Absolute 0.6 0.1 - 1.0 K/uL   Eosinophils Relative 7 %   Eosinophils Absolute 0.4 0.0 - 0.5 K/uL   Basophils Relative 1 %   Basophils Absolute 0.0 0.0 - 0.1 K/uL   Immature Granulocytes 2 %   Abs Immature Granulocytes 0.09 (H) 0.00 - 0.07 K/uL  Type and screen [place]     Status: None   Collection Time: 10/10/19 12:21 AM  Result Value Ref Range   ABO/RH(D) O POS    Antibody Screen NEG    Sample Expiration      10/13/2019,2359 Performed at [facility], [place] [address] [place], [place] [address]   Protime-INR     Status: None   Collection Time: 10/10/19 12:21 AM  Result Value Ref Range   Prothrombin Time 14.2 11.4 - 15.2 seconds   INR 1.1 0.8 - 1.2  Basic metabolic panel     Status: Abnormal   Collection Time: 10/10/19  9:57 AM  Result Value Ref Range   Sodium 141 135 - 145 mmol/L   Potassium 4.9 3.5 - 5.1 mmol/L   Chloride 112 (H) 98 - 111 mmol/L   CO2 22 22 - 32 mmol/L   Glucose, Bld 99 70 - 99 mg/dL   BUN 46 (H) 8 - 23 mg/dL   Creatinine, Ser 3.39 (H) 0.61 - 1.24 mg/dL   Calcium 7.6 (L) 8.9 - 10.3 mg/dL   GFR calc non Af Amer 17 (L) >60 mL/min   GFR calc Af Amer 19 (L) >60 mL/min   Anion gap 7 5 - 15  CBC     Status: Abnormal   Collection Time: 10/10/19  9:57 AM  Result Value Ref Range   WBC 6.3 4.0 - 10.5 K/uL   RBC 2.39 (L) 4.22 - 5.81 MIL/uL   Hemoglobin 7.6 (L) 13.0 - 17.0 g/dL   HCT 23.3 (L) 39.0 - 52.0 %   MCV 97.5 80.0 - 100.0 fL   MCH 31.8 26.0 - 34.0 pg   MCHC 32.6 30.0 - 36.0 g/dL   RDW 15.3 11.5 - 15.5 %   Platelets 135 (L) 150 - 400 K/uL   nRBC 0.0 0.0 - 0.2 %   No results found for this or any previous visit (from the past 240 hour(s)).  Renal Function: Recent Labs    10/10/19 0021 10/10/19 0957  CREATININE 3.27* 3.39*   Estimated Creatinine Clearance: 22 mL/min (A) (by C-G formula based on SCr of 3.39 mg/dL (H)).  Radiologic Imaging: No results  found.  I independently reviewed the above imaging studies.  Impression/Recommendation 1. Gross hematuria: His history is most consistent with radiation cystitis related to his prior radiation therapy for prostate cancer in 2016.  Currently, his urine is clear on minimal CBI and I would recommend continuing to titrate his CBI off.  There is no indication for operative intervention at this time.  He may resume a diet.  We will continue to monitor.  If his urine remains clear off  CBI, he likely can be discharged home with his catheter with plans for an outpatient voiding trial with Dr. Comer Locket.  2.  Prostate cancer: Continue follow-up with his primary urologist.  3.  BPH/LUTS: Continue dutasteride and Myrbetriq.  Dutch Gray 10/10/2019, 11:55 AM    Pryor Curia MD   CC: Dr. Tawanna Solo

## 2019-10-10 NOTE — Progress Notes (Signed)
PROGRESS NOTE    Darrell [name]  HYW:[id number] DOB: 12/08/1940 DOA: 10/09/2019 PCP: Cher Nakai, MD   Brief Narrative:  Patient is a 87-year male with history of CKD stage IV status post AV fistula placement, baseline creatinine between 3-4, diabetes mellitus type 2, HIV, hypertension, chronic normocytic anemia who was transferred from [facility] to here for the evaluation of persistent hematuria.  He was admitted to [facility] on the third week of December 2020 with complaints of abdomen pain, hematuria.  CT abdomen/pelvis at that time showed large clot within the urinary bladder. Urology was consulted and patient underwent cystoscopy on September 17, 2019 concerning for infiltrating bladder tumor or hemorrhagic cystitis.  Underwent fulguration and started on continuous bladder irrigation.  Despite which patient still had hematuria and had to be taken back to the OR on September 25, 2019 and underwent biopsy and resection and fulguration of the necrotic bladder tumor.  Pathology results showed no malignancy.  Due to persistent hematuria radiation oncology was consulted and at the time radiation oncology indicated no reason for radiation.  Patient was taken back to the OR on October 04, 2019 and underwent transurethral resection of the necrotic bladder tumor and transurethral resection of the adjacent prostate tissue.  Due to persistent hematuria Dr. Gilford Rile urologist on-call at [facility] was contacted and patient was transferred to further management.During the stay at [facility] patient has received total of 12 units of PRBC.  Also patient had received antibiotics for Pseudomonas UTI.  Has had neurogenic bladder for which Foley was placed.  Patient's creatinine was around 3.5 hemoglobin was around 7. 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  Currently he is on CBI.  Plan is to discharge him with Foley  catheter when urine is more clear.  Assessment & Plan:   Principal Problem:   Hematuria Active Problems:   CKD (chronic kidney disease) stage 4, GFR 15-29 ml/min (HCC)   DM (diabetes mellitus), type 2 with renal complications (HCC)   Acute blood loss anemia   Essential hypertension   Thrombocytopenia (HCC)   Gross hematuria: Finding of necrotic bladder mass status post recent cystoscopy and fulguration and biopsy, biopsy did not show any malignancy  per the report.  On continuous bladder irrigation.   Urology following here.  Urine is clearing.  Continue Foley catheter.  Hematuria suspected to be from radiation cystitis.  He will follow-up with urology as an outpatient after discharge.  History of recent Pseudomonas UTI: As per records from [facility].  Currently on cefepime.  Will change to Cipro on discharge.Will check with urology. Denies any dysuria.  Diabetes type 2: Currently on insulin.  Continue current regimen  Acute blood loss normocytic anemia: Secondary to hematuria.  Hemoglobin in the range of 7 today.  Continue to monitor  Hypertension: Currently blood pressure stable.  Continue amlodipine.  On lisinopril at home which is on hold due to worsening renal function.  CKD stage IV: Has AV graft.  Follows with nephrology.  Baseline creatinine ranges from 3-4.  Currently kidney function at baseline  History of HIV: On antiretrovirals.  History of prostate cancer: Status post radiation therapy.  Currently in remission.  BPH/LUTS: Continue dutasteride and Myrbetriq  Debility/deconditioning/generalized weakness: We will request for physical therapy.  Prolonged hospitalization.          DVT prophylaxis:SCD Code Status: DNR Family Communication: None present at the bedside Disposition Plan: Home  after urology clearance   Consultants: Urology  Procedures:, Cystoscopy, TURBT  Antimicrobials:  Anti-infectives (From admission, onward)   Start     Dose/Rate  Route Frequency Ordered Stop   10/10/19 1600  ceFEPIme (MAXIPIME) 2 g in sodium chloride 0.9 % 100 mL IVPB     2 g 200 mL/hr over 30 Minutes Intravenous Every 24 hours 10/10/19 0933     10/10/19 1000  emtricitabine-rilpivir-tenofovir AF (ODEFSEY) 200-25-25 MG per tablet 1 tablet     1 tablet Oral Daily 10/10/19 0158     10/10/19 0400  ceFEPIme (MAXIPIME) 1 g in sodium chloride 0.9 % 100 mL IVPB  Status:  Discontinued     1 g 200 mL/hr over 30 Minutes Intravenous Every 24 hours 10/10/19 0226 10/10/19 0933      Subjective:  Patient seen and examined at the bedside this morning.  Hemodynamically stable.  Looks comfortable.  On CBI, drainage of pinkish urine.  Denies any pain or discomfort.  Objective: Vitals:   10/10/19 0200 10/10/19 0439 10/10/19 1002 10/10/19 1417  BP:  123/70 128/67 126/60  Pulse:  72 81 84  Resp:  18 18 16   Temp:  98 F (36.7 C) 98.1 F (36.7 C) 98.4 F (36.9 C)  TempSrc:  Oral Oral Oral  SpO2:  100% 99% 96%  Weight: 104 kg       Intake/Output Summary (Last 24 hours) at 10/10/2019 1456 Last data filed at 10/10/2019 1300 Gross per 24 hour  Intake 30100 ml  Output 20500 ml  Net 9600 ml   Filed Weights   10/10/19 0200  Weight: 104 kg    Examination:  General exam: Not in distress, generalized weakness HEENT:PERRL, Ear/Nose normal on gross exam Respiratory system: Bilateral equal air entry, normal vesicular breath sounds, no wheezes or crackles  Cardiovascular system: S1 & S2 heard, RRR. No JVD, murmurs, rubs, gallops or clicks. No pedal edema. Gastrointestinal system: Abdomen is nondistended, soft and nontender. No organomegaly or masses felt. Normal bowel sounds heard. Central nervous system: Alert and oriented. No focal neurological deficits. Extremities: No edema, no clubbing ,no cyanosis Skin: No rashes, lesions or ulcers,no icterus ,no pallor GU: CBI , pink urine in the Foley bag   Data Reviewed: I have personally reviewed following labs and  imaging studies  CBC: Recent Labs  Lab 10/10/19 0021 10/10/19 0957  WBC 6.2 6.3  NEUTROABS 3.9  --   HGB 7.2* 7.6*  HCT 22.4* 23.3*  MCV 98.2 97.5  PLT 133* 662*   Basic Metabolic Panel: Recent Labs  Lab 10/10/19 0021 10/10/19 0957  NA 140 141  K 4.8 4.9  CL 112* 112*  CO2 22 22  GLUCOSE 98 99  BUN 45* 46*  CREATININE 3.27* 3.39*  CALCIUM 7.5* 7.6*   GFR: Estimated Creatinine Clearance: 22 mL/min (A) (by C-G formula based on SCr of 3.39 mg/dL (H)). Liver Function Tests: Recent Labs  Lab 10/10/19 0021  AST 24  ALT 26  ALKPHOS 53  BILITOT 0.6  PROT 4.2*  ALBUMIN 1.7*   No results for input(s): LIPASE, AMYLASE in the last 168 hours. No results for input(s): AMMONIA in the last 168 hours. Coagulation Profile: Recent Labs  Lab 10/10/19 0021  INR 1.1   Cardiac Enzymes: No results for input(s): CKTOTAL, CKMB, CKMBINDEX, TROPONINI in the last 168 hours. BNP (last 3 results) No results for input(s): PROBNP in the last 8760 hours. HbA1C: No results for input(s): HGBA1C in the last 72 hours. CBG: No results  for input(s): GLUCAP in the last 168 hours. Lipid Profile: No results for input(s): CHOL, HDL, LDLCALC, TRIG, CHOLHDL, LDLDIRECT in the last 72 hours. Thyroid Function Tests: No results for input(s): TSH, T4TOTAL, FREET4, T3FREE, THYROIDAB in the last 72 hours. Anemia Panel: No results for input(s): VITAMINB12, FOLATE, FERRITIN, TIBC, IRON, RETICCTPCT in the last 72 hours. Sepsis Labs: No results for input(s): PROCALCITON, LATICACIDVEN in the last 168 hours.  Recent Results (from the past 240 hour(s))  SARS CORONAVIRUS 2 (TAT 6-24 HRS) Nasopharyngeal Nasopharyngeal Swab     Status: None   Collection Time: 10/09/19 11:41 PM   Specimen: Nasopharyngeal Swab  Result Value Ref Range Status   SARS Coronavirus 2 NEGATIVE NEGATIVE Final    Comment: (NOTE) SARS-CoV-2 target nucleic acids are NOT DETECTED. The SARS-CoV-2 RNA is generally detectable in upper and  lower respiratory specimens during the acute phase of infection. Negative results do not preclude SARS-CoV-2 infection, do not rule out co-infections with other pathogens, and should not be used as the sole basis for treatment or other patient management decisions. Negative results must be combined with clinical observations, patient history, and epidemiological information. The expected result is Negative. Fact Sheet for Patients: SugarRoll.be Fact Sheet for Healthcare Providers: https://www.woods-mathews.com/ This test is not yet approved or cleared by the [place] FDA and  has been authorized for detection and/or diagnosis of SARS-CoV-2 by FDA under an Emergency Use Authorization (EUA). This EUA will remain  in effect (meaning this test can be used) for the duration of the COVID-19 declaration under Section 56 4(b)(1) of the Act, 21 U.S.C. section 360bbb-3(b)(1), unless the authorization is terminated or revoked sooner. Performed at [facility], [place] [address] [place], [place] [address]          Radiology Studies: No results found.      Scheduled Meds: . amLODipine  10 mg Oral Daily  . atorvastatin  20 mg Oral Daily  . buPROPion  150 mg Oral Daily  . calcium carbonate  1,250 mg Oral QAC breakfast  . Chlorhexidine Gluconate Cloth  6 each Topical Daily  . DULoxetine  60 mg Oral Daily  . dutasteride  0.5 mg Oral Daily  . emtricitabine-rilpivir-tenofovir AF  1 tablet Oral Daily  . fesoterodine  8 mg Oral Daily  . insulin aspart  0-9 Units Subcutaneous Q4H  . insulin aspart protamine- aspart  80 Units Subcutaneous Q breakfast  . mirabegron ER  25 mg Oral Daily  . multivitamin with minerals  1 tablet Oral Daily  . omega-3 acid ethyl esters  1 g Oral Daily  . risperiDONE  1 mg Oral QHS  . sodium chloride flush  10-40 mL Intracatheter Q12H  . tamsulosin  0.4 mg Oral Daily  . trazodone  300 mg Oral QHS  . zolpidem  5  mg Oral QHS   Continuous Infusions: . ceFEPime (MAXIPIME) IV       LOS: 1 day    Time spent: 25 mins,More than 50% of that time was spent in counseling and/or coordination of care.      Shelly Coss, MD [facility] Pager [phone]  If 7PM-7AM, please contact night-coverage [email or web address] Password [facility] 10/10/2019, 2:56 PM

## 2019-10-10 NOTE — Progress Notes (Signed)
Spoke with Allayne Gitelman patients POA regarding plan of care.

## 2019-10-11 ENCOUNTER — Encounter (HOSPITAL_COMMUNITY)
Admission: AD | Disposition: A | Discharge status: Skilled Nursing Facility | Payer: Self-pay | Source: Other Acute Inpatient Hospital | Attending: Internal Medicine

## 2019-10-11 ENCOUNTER — Inpatient Hospital Stay (HOSPITAL_COMMUNITY): Payer: Medicare Other | Admitting: Anesthesiology

## 2019-10-11 ENCOUNTER — Encounter (HOSPITAL_COMMUNITY): Payer: Self-pay | Admitting: Internal Medicine

## 2019-10-11 HISTORY — PX: TRANSURETHRAL RESECTION OF PROSTATE: SHX73

## 2019-10-11 LAB — CBC WITH DIFFERENTIAL/PLATELET
Abs Immature Granulocytes: 0.02 10*3/uL (ref 0.00–0.07)
Basophils Absolute: 0 10*3/uL (ref 0.0–0.1)
Basophils Relative: 1 %
Eosinophils Absolute: 0.4 10*3/uL (ref 0.0–0.5)
Eosinophils Relative: 9 %
HCT: 20.7 % — ABNORMAL LOW (ref 39.0–52.0)
Hemoglobin: 6.5 g/dL — CL (ref 13.0–17.0)
Immature Granulocytes: 1 %
Lymphocytes Relative: 22 %
Lymphs Abs: 1 10*3/uL (ref 0.7–4.0)
MCH: 31 pg (ref 26.0–34.0)
MCHC: 31.4 g/dL (ref 30.0–36.0)
MCV: 98.6 fL (ref 80.0–100.0)
Monocytes Absolute: 0.4 10*3/uL (ref 0.1–1.0)
Monocytes Relative: 10 %
Neutro Abs: 2.6 10*3/uL (ref 1.7–7.7)
Neutrophils Relative %: 57 %
Platelets: 129 10*3/uL — ABNORMAL LOW (ref 150–400)
RBC: 2.1 MIL/uL — ABNORMAL LOW (ref 4.22–5.81)
RDW: 15.4 % (ref 11.5–15.5)
WBC: 4.4 10*3/uL (ref 4.0–10.5)
nRBC: 0 % (ref 0.0–0.2)

## 2019-10-11 LAB — GLUCOSE, CAPILLARY
Glucose-Capillary: 109 mg/dL — ABNORMAL HIGH (ref 70–99)
Glucose-Capillary: 118 mg/dL — ABNORMAL HIGH (ref 70–99)
Glucose-Capillary: 130 mg/dL — ABNORMAL HIGH (ref 70–99)
Glucose-Capillary: 125 mg/dL — ABNORMAL HIGH (ref 70–99)
Glucose-Capillary: 102 mg/dL — ABNORMAL HIGH (ref 70–99)
Glucose-Capillary: 115 mg/dL — ABNORMAL HIGH (ref 70–99)
Glucose-Capillary: 114 mg/dL — ABNORMAL HIGH (ref 70–99)
Glucose-Capillary: 132 mg/dL — ABNORMAL HIGH (ref 70–99)

## 2019-10-11 LAB — CBC
HCT: 29.9 % — ABNORMAL LOW (ref 39.0–52.0)
Hemoglobin: 9.9 g/dL — ABNORMAL LOW (ref 13.0–17.0)
MCH: 30.7 pg (ref 26.0–34.0)
MCHC: 33.1 g/dL (ref 30.0–36.0)
MCV: 92.9 fL (ref 80.0–100.0)
Platelets: 129 10*3/uL — ABNORMAL LOW (ref 150–400)
RBC: 3.22 MIL/uL — ABNORMAL LOW (ref 4.22–5.81)
RDW: 15.4 % (ref 11.5–15.5)
WBC: 7.6 10*3/uL (ref 4.0–10.5)
nRBC: 0 % (ref 0.0–0.2)

## 2019-10-11 LAB — SURGICAL PCR SCREEN
MRSA, PCR: NEGATIVE
Staphylococcus aureus: NEGATIVE

## 2019-10-11 LAB — BASIC METABOLIC PANEL
Anion gap: 4 — ABNORMAL LOW (ref 5–15)
BUN: 46 mg/dL — ABNORMAL HIGH (ref 8–23)
CO2: 25 mmol/L (ref 22–32)
Calcium: 7.5 mg/dL — ABNORMAL LOW (ref 8.9–10.3)
Chloride: 113 mmol/L — ABNORMAL HIGH (ref 98–111)
Creatinine, Ser: 3.47 mg/dL — ABNORMAL HIGH (ref 0.61–1.24)
GFR calc Af Amer: 19 mL/min — ABNORMAL LOW (ref >60)
GFR calc non Af Amer: 16 mL/min — ABNORMAL LOW (ref >60)
Glucose, Bld: 114 mg/dL — ABNORMAL HIGH (ref 70–99)
Potassium: 4.8 mmol/L (ref 3.5–5.1)
Sodium: 142 mmol/L (ref 135–145)

## 2019-10-11 LAB — PREPARE RBC (CROSSMATCH)

## 2019-10-11 SURGERY — TURP (TRANSURETHRAL RESECTION OF PROSTATE)
Anesthesia: General

## 2019-10-11 MED ORDER — ONDANSETRON HCL 4 MG/2ML IJ SOLN
INTRAMUSCULAR | Status: AC
  Filled 2019-10-11: qty 2

## 2019-10-11 MED ORDER — FENTANYL CITRATE (PF) 100 MCG/2ML IJ SOLN: 25.0000 ug | INTRAMUSCULAR | Status: DC | PRN

## 2019-10-11 MED ORDER — PROPOFOL 10 MG/ML IV BOLUS
INTRAVENOUS | Status: AC
  Filled 2019-10-11: qty 20

## 2019-10-11 MED ORDER — INDIGOTINDISULFONATE SODIUM 8 MG/ML IJ SOLN
INTRAMUSCULAR | Status: AC
  Filled 2019-10-11: qty 5

## 2019-10-11 MED ORDER — PROPOFOL 500 MG/50ML IV EMUL
INTRAVENOUS | Status: DC | PRN
  Administered 2019-10-11: 100 mg via INTRAVENOUS

## 2019-10-11 MED ORDER — LIDOCAINE 2% (20 MG/ML) 5 ML SYRINGE
INTRAMUSCULAR | Status: DC | PRN
  Administered 2019-10-11: 60 mg via INTRAVENOUS

## 2019-10-11 MED ORDER — ENSURE ENLIVE PO LIQD
237.0000 mL | Freq: Two times a day (BID) | ORAL | Status: DC
  Administered 2019-10-12 – 2019-10-15 (×5): 237 mL via ORAL

## 2019-10-11 MED ORDER — FENTANYL CITRATE (PF) 100 MCG/2ML IJ SOLN
INTRAMUSCULAR | Status: DC | PRN
  Administered 2019-10-11: 50 ug via INTRAVENOUS

## 2019-10-11 MED ORDER — ACETAMINOPHEN 10 MG/ML IV SOLN: 1000.0000 mg | Freq: Once | INTRAVENOUS | Status: DC | PRN

## 2019-10-11 MED ORDER — DEXAMETHASONE SODIUM PHOSPHATE 10 MG/ML IJ SOLN
INTRAMUSCULAR | Status: AC
  Filled 2019-10-11: qty 1

## 2019-10-11 MED ORDER — LIDOCAINE 2% (20 MG/ML) 5 ML SYRINGE
INTRAMUSCULAR | Status: AC
  Filled 2019-10-11: qty 5

## 2019-10-11 MED ORDER — DEXAMETHASONE SODIUM PHOSPHATE 10 MG/ML IJ SOLN
INTRAMUSCULAR | Status: DC | PRN
  Administered 2019-10-11: 10 mg via INTRAVENOUS

## 2019-10-11 MED ORDER — PRO-STAT SUGAR FREE PO LIQD
30.0000 mL | Freq: Two times a day (BID) | ORAL | Status: DC
  Administered 2019-10-12 – 2019-10-14 (×6): 30 mL via ORAL
  Filled 2019-10-11 (×6): qty 30

## 2019-10-11 MED ORDER — SODIUM CHLORIDE 0.9% IV SOLUTION: Freq: Once | INTRAVENOUS | Status: DC

## 2019-10-11 MED ORDER — ENSURE MAX PROTEIN PO LIQD
11.0000 [oz_av] | Freq: Every day | ORAL | Status: DC
  Administered 2019-10-11 – 2019-10-15 (×4): 11 [oz_av] via ORAL
  Filled 2019-10-11 (×6): qty 330

## 2019-10-11 MED ORDER — STERILE WATER FOR IRRIGATION IR SOLN
Status: DC | PRN
  Administered 2019-10-11: 9000 mL

## 2019-10-11 MED ORDER — BELLADONNA ALKALOIDS-OPIUM 16.2-60 MG RE SUPP
1.0000 | Freq: Three times a day (TID) | RECTAL | Status: DC | PRN
  Administered 2019-10-11 (×2): 1 via RECTAL
  Filled 2019-10-11 (×2): qty 1

## 2019-10-11 MED ORDER — SODIUM CHLORIDE 0.9 % IV SOLN: INTRAVENOUS | Status: DC

## 2019-10-11 MED ORDER — ONDANSETRON HCL 4 MG/2ML IJ SOLN: 4.0000 mg | Freq: Once | INTRAMUSCULAR | Status: DC | PRN

## 2019-10-11 MED ORDER — FENTANYL CITRATE (PF) 100 MCG/2ML IJ SOLN
INTRAMUSCULAR | Status: AC
  Filled 2019-10-11: qty 2

## 2019-10-11 MED ORDER — INDIGOTINDISULFONATE SODIUM 8 MG/ML IJ SOLN
INTRAMUSCULAR | Status: DC | PRN
  Administered 2019-10-11: 5 mL via INTRAVENOUS

## 2019-10-11 MED ORDER — LACTATED RINGERS IV SOLN: INTRAVENOUS | Status: DC | PRN

## 2019-10-11 SURGICAL SUPPLY — 18 items
BAG URINE DRAIN 2000ML AR STRL (UROLOGICAL SUPPLIES) IMPLANT
BAG URO CATCHER STRL LF (MISCELLANEOUS) ×3 IMPLANT
CATH HEMA 3WAY 30CC 22FR COUDE (CATHETERS) IMPLANT
GLOVE BIOGEL M STRL SZ7.5 (GLOVE) ×3 IMPLANT
GOWN STRL REUS W/TWL LRG LVL3 (GOWN DISPOSABLE) ×3 IMPLANT
HOLDER FOLEY CATH W/STRAP (MISCELLANEOUS) IMPLANT
KIT TURNOVER KIT A (KITS) IMPLANT
LOOP CUT BIPOLAR 24F LRG (ELECTROSURGICAL) IMPLANT
LOOP MONOPOLAR YLW (ELECTROSURGICAL) ×3 IMPLANT
MANIFOLD NEPTUNE II (INSTRUMENTS) ×3 IMPLANT
PACK CYSTO (CUSTOM PROCEDURE TRAY) ×3 IMPLANT
PENCIL SMOKE EVACUATOR (MISCELLANEOUS) IMPLANT
SYR 30ML LL (SYRINGE) ×3 IMPLANT
SYR TOOMEY IRRIG 70ML (MISCELLANEOUS) ×3
SYRINGE TOOMEY IRRIG 70ML (MISCELLANEOUS) ×1 IMPLANT
TUBING CONNECTING 10 (TUBING) ×2 IMPLANT
TUBING CONNECTING 10' (TUBING) ×1
TUBING UROLOGY SET (TUBING) ×3 IMPLANT

## 2019-10-11 NOTE — Progress Notes (Signed)
PROGRESS NOTE    Darrell [name]  AJG:[id number] DOB: 05-15-1941 DOA: 10/09/2019 PCP: Cher Nakai, MD   Brief Narrative:  Patient is a 12-year male with history of CKD stage IV status post AV fistula placement, baseline creatinine between 3-4, diabetes mellitus type 2, HIV, hypertension, chronic normocytic anemia who was transferred from [facility] to here for the evaluation of persistent hematuria.  He was admitted to [facility] on the third week of December 2020 with complaints of abdomen pain, hematuria.  CT abdomen/pelvis at that time showed large clot within the urinary bladder. Urology was consulted and patient underwent cystoscopy on September 17, 2019 concerning for infiltrating bladder tumor or hemorrhagic cystitis.  Underwent fulguration and started on continuous bladder irrigation.  Despite which patient still had hematuria and had to be taken back to the OR on September 25, 2019 and underwent biopsy and resection and fulguration of the necrotic bladder tumor.  Pathology results showed no malignancy.  Due to persistent hematuria radiation oncology was consulted and at the time radiation oncology indicated no reason for radiation.  Patient was taken back to the OR on October 04, 2019 and underwent transurethral resection of the necrotic bladder tumor and transurethral resection of the adjacent prostate tissue.  Due to persistent hematuria Dr. Gilford Rile urologist on-call at [facility] was contacted and patient was transferred to further management.During the stay at [facility] patient has received total of 12 units of PRBC.  Also patient had received antibiotics for Pseudomonas UTI.  Has had neurogenic bladder for which Foley was placed.  Patient's creatinine was around 3.5 hemoglobin was around 7. 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  Currently he is on CBI.  Urology planning for cystoscopy  today.  Assessment & Plan:   Principal Problem:   Hematuria Active Problems:   CKD (chronic kidney disease) stage 4, GFR 15-29 ml/min (HCC)   DM (diabetes mellitus), type 2 with renal complications (HCC)   Acute blood loss anemia   Essential hypertension   Thrombocytopenia (HCC)   Gross hematuria: Finding of necrotic bladder mass status post recent cystoscopy and fulguration and biopsy, biopsy did not show any malignancy  per the report.  On continuous bladder irrigation.   Urology following here..  Continue Foley catheter.  Hematuria suspected to be from radiation cystitis.  Urology planning for cystoscopy today.  He will follow-up with urology as an outpatient after discharge.  History of recent Pseudomonas UTI: As per records from [facility].  Currently on cefepime.  Will change to Cipro on discharge.Will check with urology. Denies any dysuria.  Diabetes type 2: Currently on insulin.  Continue current regimen  Acute blood loss normocytic anemia: Hemoglobin again dropped to the range of 6 today .secondary to hematuria.  Transfused him with 2 units of PRBC on 10/11/19.  Several units of transfusion at [facility]  Hypertension: Currently blood pressure stable.  Continue amlodipine.  On lisinopril at home which is on hold due to worsening renal function.  CKD stage IV: Has AV graft.  Follows with nephrology.  Baseline creatinine ranges from 3-4.  Currently kidney function at baseline  History of HIV: On antiretrovirals.  History of prostate cancer: Status post radiation therapy.  Currently in remission.  BPH/LUTS: Continue dutasteride and Myrbetriq  Debility/deconditioning/generalized weakness: We will request for physical therapy.  Prolonged hospitalization.          DVT prophylaxis:SCD Code Status: DNR Family  Communication: None present at the bedside Disposition Plan: Home after urology clearance.  Pending PT evaluation   Consultants:  Urology  Procedures:, Cystoscopy, TURBT  Antimicrobials:  Anti-infectives (From admission, onward)   Start     Dose/Rate Route Frequency Ordered Stop   10/10/19 1600  ceFEPIme (MAXIPIME) 2 g in sodium chloride 0.9 % 100 mL IVPB     2 g 200 mL/hr over 30 Minutes Intravenous Every 24 hours 10/10/19 0933     10/10/19 1000  emtricitabine-rilpivir-tenofovir AF (ODEFSEY) 200-25-25 MG per tablet 1 tablet     1 tablet Oral Daily 10/10/19 0158     10/10/19 0400  ceFEPIme (MAXIPIME) 1 g in sodium chloride 0.9 % 100 mL IVPB  Status:  Discontinued     1 g 200 mL/hr over 30 Minutes Intravenous Every 24 hours 10/10/19 0226 10/10/19 0933      Subjective: Patient seen and examined the bedside this morning.  Hemodynamically stable.  Complains of pain on the Foley catheter site.  Urine is still pinkish.  Objective: Vitals:   10/11/19 0516 10/11/19 0839 10/11/19 0910 10/11/19 1125  BP: 140/73 (!) 150/75 133/70 140/73  Pulse: 72 86 81 78  Resp: 16 18 18 16   Temp: 97.6 F (36.4 C) 98.1 F (36.7 C) 97.9 F (36.6 C) 98 F (36.7 C)  TempSrc: Oral Oral Oral Oral  SpO2: 97% 100% 96% 99%  Weight:        Intake/Output Summary (Last 24 hours) at 10/11/2019 1138 Last data filed at 10/11/2019 1119 Gross per 24 hour  Intake 24160 ml  Output 35275 ml  Net -11115 ml   Filed Weights   10/10/19 0200  Weight: 104 kg    Examination:  General exam: Not in distress, generalized weakness HEENT:PERRL, Ear/Nose normal on gross exam Respiratory system: Bilateral equal air entry, normal vesicular breath sounds, no wheezes or crackles  Cardiovascular system: S1 & S2 heard, RRR. No JVD, murmurs, rubs, gallops or clicks. No pedal edema. Gastrointestinal system: Abdomen is nondistended, soft and nontender. No organomegaly or masses felt. Normal bowel sounds heard. Central nervous system: Alert and oriented. No focal neurological deficits. Extremities: No edema, no clubbing ,no cyanosis Skin: No rashes, lesions  or ulcers,no icterus ,no pallor GU: CBI , pink urine in the Foley bag   Data Reviewed: I have personally reviewed following labs and imaging studies  CBC: Recent Labs  Lab 10/10/19 0021 10/10/19 0957 10/11/19 0340  WBC 6.2 6.3 4.4  NEUTROABS 3.9  --  2.6  HGB 7.2* 7.6* 6.5*  HCT 22.4* 23.3* 20.7*  MCV 98.2 97.5 98.6  PLT 133* 135* 240*   Basic Metabolic Panel: Recent Labs  Lab 10/10/19 0021 10/10/19 0957 10/11/19 0340  NA 140 141 142  K 4.8 4.9 4.8  CL 112* 112* 113*  CO2 22 22 25   GLUCOSE 98 99 114*  BUN 45* 46* 46*  CREATININE 3.27* 3.39* 3.47*  CALCIUM 7.5* 7.6* 7.5*   GFR: Estimated Creatinine Clearance: 21.5 mL/min (A) (by C-G formula based on SCr of 3.47 mg/dL (H)). Liver Function Tests: Recent Labs  Lab 10/10/19 0021  AST 24  ALT 26  ALKPHOS 53  BILITOT 0.6  PROT 4.2*  ALBUMIN 1.7*   No results for input(s): LIPASE, AMYLASE in the last 168 hours. No results for input(s): AMMONIA in the last 168 hours. Coagulation Profile: Recent Labs  Lab 10/10/19 0021  INR 1.1   Cardiac Enzymes: No results for input(s): CKTOTAL, CKMB, CKMBINDEX, TROPONINI in the last  168 hours. BNP (last 3 results) No results for input(s): PROBNP in the last 8760 hours. HbA1C: No results for input(s): HGBA1C in the last 72 hours. CBG: Recent Labs  Lab 10/10/19 2127 10/11/19 0216 10/11/19 0516 10/11/19 0949 10/11/19 1123  GLUCAP 147* 109* 118* 130* 125*   Lipid Profile: No results for input(s): CHOL, HDL, LDLCALC, TRIG, CHOLHDL, LDLDIRECT in the last 72 hours. Thyroid Function Tests: No results for input(s): TSH, T4TOTAL, FREET4, T3FREE, THYROIDAB in the last 72 hours. Anemia Panel: No results for input(s): VITAMINB12, FOLATE, FERRITIN, TIBC, IRON, RETICCTPCT in the last 72 hours. Sepsis Labs: No results for input(s): PROCALCITON, LATICACIDVEN in the last 168 hours.  Recent Results (from the past 240 hour(s))  SARS CORONAVIRUS 2 (TAT 6-24 HRS) Nasopharyngeal  Nasopharyngeal Swab     Status: None   Collection Time: 10/09/19 11:41 PM   Specimen: Nasopharyngeal Swab  Result Value Ref Range Status   SARS Coronavirus 2 NEGATIVE NEGATIVE Final    Comment: (NOTE) SARS-CoV-2 target nucleic acids are NOT DETECTED. The SARS-CoV-2 RNA is generally detectable in upper and lower respiratory specimens during the acute phase of infection. Negative results do not preclude SARS-CoV-2 infection, do not rule out co-infections with other pathogens, and should not be used as the sole basis for treatment or other patient management decisions. Negative results must be combined with clinical observations, patient history, and epidemiological information. The expected result is Negative. Fact Sheet for Patients: SugarRoll.be Fact Sheet for Healthcare Providers: https://www.woods-mathews.com/ This test is not yet approved or cleared by the [place] FDA and  has been authorized for detection and/or diagnosis of SARS-CoV-2 by FDA under an Emergency Use Authorization (EUA). This EUA will remain  in effect (meaning this test can be used) for the duration of the COVID-19 declaration under Section 56 4(b)(1) of the Act, 21 U.S.C. section 360bbb-3(b)(1), unless the authorization is terminated or revoked sooner. Performed at [facility], [place] [address] [place], [place] [address]          Radiology Studies: No results found.      Scheduled Meds: . sodium chloride   Intravenous Once  . amLODipine  10 mg Oral Daily  . atorvastatin  20 mg Oral Daily  . buPROPion  150 mg Oral Daily  . calcium carbonate  1,250 mg Oral QAC breakfast  . Chlorhexidine Gluconate Cloth  6 each Topical Daily  . DULoxetine  60 mg Oral Daily  . dutasteride  0.5 mg Oral Daily  . emtricitabine-rilpivir-tenofovir AF  1 tablet Oral Daily  . fesoterodine  8 mg Oral Daily  . insulin aspart  0-9 Units Subcutaneous Q4H  . insulin aspart  protamine- aspart  80 Units Subcutaneous Q breakfast  . mirabegron ER  25 mg Oral Daily  . multivitamin with minerals  1 tablet Oral Daily  . omega-3 acid ethyl esters  1 g Oral Daily  . risperiDONE  1 mg Oral QHS  . sodium chloride flush  10-40 mL Intracatheter Q12H  . tamsulosin  0.4 mg Oral Daily  . trazodone  300 mg Oral QHS  . zolpidem  5 mg Oral QHS   Continuous Infusions: . ceFEPime (MAXIPIME) IV 2 g (10/10/19 1737)     LOS: 2 days    Time spent: 25 mins,More than 50% of that time was spent in counseling and/or coordination of care.      Shelly Coss, MD [facility] Pager [phone]  If 7PM-7AM, please contact night-coverage [email or web address] Password [facility] 10/11/2019,  11:38 AM

## 2019-10-11 NOTE — Progress Notes (Signed)
Patient ID: Darrell [name], male   DOB: [birth year], 79 y.o.   MRN: [id number]    Subjective: Pt with increased hematuria yesterday requiring nursing staff to hand irrigate multiple clots from bladder.  He remains hemodynamically stable but Hgb has dropped to 6.5 and he is scheduled to receive blood transfusion this morning.  Objective: Vital signs in last 24 hours: Temp:  [97.6 F (36.4 C)-98.4 F (36.9 C)] 97.6 F (36.4 C) (01/14 0516) Pulse Rate:  [72-84] 72 (01/14 0516) Resp:  [16-18] 16 (01/14 0516) BP: (126-140)/(60-73) 140/73 (01/14 0516) SpO2:  [96 %-99 %] 97 % (01/14 0516)  Intake/Output from previous day: 01/13 0701 - 01/14 0700 In: 18160 [P.O.:60; IV Piggyback:100] Out: 27625 [Urine:27625] Intake/Output this shift: No intake/output data recorded.  Physical Exam:  General: Alert and oriented CV: RRR Abdomen: Soft, ND, NT GU: Urine clear in tubing on moderate CBI  Lab Results: Recent Labs    10/10/19 0021 10/10/19 0957 10/11/19 0340  HGB 7.2* 7.6* 6.5*  HCT 22.4* 23.3* 20.7*   BMET Recent Labs    10/10/19 0957 10/11/19 0340  NA 141 142  K 4.9 4.8  CL 112* 113*  CO2 22 25  GLUCOSE 99 114*  BUN 46* 46*  CREATININE 3.39* 3.47*  CALCIUM 7.6* 7.5*     Procedure: I hand irrigated his catheter this morning and removed numerous large, fresh clots from his bladder.  Even after irrigation, it was unclear whether he may have more clot but his situation is consistent with ongoing and active bleeding.  Assessment/Plan: Hematuria from unclear etiology (possible radiation cystitis): NPO this morning.  Transfuse PRBCs as planned.  Continue brisk CBI and irrigation prn. Will add on to OR schedule for later this afternoon/tonight for cystoscopy under anesthesia, clot evacuation, and fulguration/TUR as needed to better assess the situation and plan for more definitive, aggressive treatment as indicated.   LOS: 2 days   Dutch Gray 10/11/2019, 7:14 AM

## 2019-10-11 NOTE — Anesthesia Procedure Notes (Signed)
Procedure Name: LMA Insertion Date/Time: 10/11/2019 4:21 PM Performed by: Gerald Leitz, CRNA Pre-anesthesia Checklist: Patient identified, Patient being monitored, Timeout performed, Emergency Drugs available and Suction available Patient Re-evaluated:Patient Re-evaluated prior to induction Oxygen Delivery Method: Circle system utilized Preoxygenation: Pre-oxygenation with 100% oxygen Induction Type: IV induction Ventilation: Mask ventilation without difficulty LMA: LMA with gastric port inserted LMA Size: 4.0 Tube type: Oral Number of attempts: 1 Placement Confirmation: positive ETCO2 and breath sounds checked- equal and bilateral Tube secured with: Tape Dental Injury: Teeth and Oropharynx as per pre-operative assessment

## 2019-10-11 NOTE — Progress Notes (Signed)
PT Cancellation Note  Patient Details Name: Darrell [name] MRN: [id number] DOB: 06-22-1941   Cancelled Treatment:    Reason Eval/Treat Not Completed: Medical issues which prohibited therapy Pt currently receiving blood, has CBI and scheduled for cystoscopy under anesthesia, clot evacuation, and fulguration/TUR as needed by urology later today.  Will check back as schedule permits.   Tru Leopard,KATHrine E 10/11/2019, 2:43 PM Jannette Spanner PT, DPT Acute Rehabilitation Services Office: [phone]

## 2019-10-11 NOTE — Progress Notes (Signed)
Patient complained of bladder spasms and burning at insertion site of the cathter. Urologist contacted and verbal order received B&O suppository every eight ours as needed for spasms. Discussed aspirin allergy with patient and pt reports not having an allergy.

## 2019-10-11 NOTE — Progress Notes (Signed)
Refer to I/O for CBI total.  Pt did not need to be irrigated t/o shift,

## 2019-10-11 NOTE — Progress Notes (Addendum)
Initial Nutrition Assessment  DOCUMENTATION CODES:   Obesity unspecified  INTERVENTION:  - re-advance diet as medically feasible.  - will order Ensure Enlive BID, each supplement provides 350 kcal and 20 grams of protein. - will order Ensure Max once/day, each supplement provides 150 kcal and 30 grams protein. - will order 30 mL Prostat BID, each supplement provides 100 kcal and 15 grams of protein.   NUTRITION DIAGNOSIS:   Increased nutrient needs related to acute illness, chronic illness as evidenced by estimated needs.  GOAL:   Patient will meet greater than or equal to 90% of their needs  MONITOR:   PO intake, Supplement acceptance, Labs, Weight trends  REASON FOR ASSESSMENT:   Malnutrition Screening Tool  ASSESSMENT:   72-year male with history of stage 4 CKD s/p AV fistula placement, baseline creatinine between 3-4, type 2 DM, HIV, HTN, and chronic normocytic anemia. He was transferred from [facility] for evaluation of persistent hematuria He was admitted to [facility] the third week of December with abdominal pain and hematuria. CT abdomen/pelvis at that time showed large clot in the bladder. He underwent cystoscopy on 12/21 concerning for infiltrating bladder tumor or hemorrhagic cystitis. He returned to the OR on 12/29 for biopsy and resection of necrotic bladder tumor; pathology showed no malignancy. He returned to the OR on 1/7 for transurethral resection of necrotic bladder tumor and transurethral resection of adjacent prostate tissue. He has been s/p 12 units PRBC since admission to [facility]. Foley was placed d/t neurogenic bladder.  Patient has had a decreased appetite over the past 3-4 weeks. He has a lack of desire or drive to eat and does better with drinking liquids. He denies any N/V but continues to have some abdominal pressure/pain. He has only had bites of items since admission. He has been NPO since 0715 today.   Per chart review, current weight is 229  lb and weight has been stable for the past 7 months.   Per notes: - gross hematuria--Urology following; unclear etiology  - recent UTI - acute blood loss normocytic anemia--s/p 2 units PRBCs today - stage 4 CKD with AV graft and follows with Nephrology but not currently on dialysis - hx of HIV - hx of prostate cancer--s/p radiation; in remission  - debility/deconditioning, generalized weakness--PT; anticipated prolonged hospitalization   Labs reviewed; CBGs: 109, 118, 130, and 125 mg/dl, Cl: 113 mmol/l, BUN: 46 mg/dl, creatinine: 3.47 mg/dl, Ca: 7.5 mg/dl, GFR: 16 ml/min. Medications reviewed; 1 tablet oscal/day, sliding scale novolog, 80 units 70/30 x1/day, daily multivitamin with minerals, 1 g locaza/day.     NUTRITION - FOCUSED PHYSICAL EXAM:  completed; no fat and no muscle wasting noted.   Diet Order:   Diet Order            Diet NPO time specified  Diet effective now              EDUCATION NEEDS:   No education needs have been identified at this time  Skin:  Skin Assessment: Reviewed RN Assessment  Last BM:  1/14  Height:   Ht Readings from Last 1 Encounters:  05/01/19 5\' 9"  (1.753 m)    Weight:   Wt Readings from Last 1 Encounters:  10/10/19 104 kg    Ideal Body Weight:  72.7 kg  BMI:  Body mass index is 33.86 kg/m.  Estimated Nutritional Needs:   Kcal:  2080-2390 kcal  Protein:  105-120 grams  Fluid:  >/= 2.3 L/day  Jarome Matin, MS, RD, LDN, [facility] Inpatient Clinical Dietitian Pager # [phone] After hours/weekend pager # [phone]

## 2019-10-11 NOTE — Anesthesia Preprocedure Evaluation (Addendum)
Anesthesia Evaluation  Patient identified by MRN, date of birth, ID band Patient awake    Reviewed: Allergy & Precautions, Patient's Chart, lab work & pertinent test results  Airway Mallampati: II  TM Distance: >3 FB Neck ROM: Full    Dental no notable dental hx. (+) Poor Dentition, Dental Advisory Given   Pulmonary neg pulmonary ROS,    Pulmonary exam normal breath sounds clear to auscultation       Cardiovascular hypertension, Pt. on medications Normal cardiovascular exam Rhythm:Regular Rate:Normal     Neuro/Psych PSYCHIATRIC DISORDERS Schizophrenia negative neurological ROS     GI/Hepatic (+)     substance abuse  ,   Endo/Other  diabetes, Type 1  Renal/GU CRFRenal diseaseCr 3.47 K+ 4.8      Musculoskeletal   Abdominal (+) + obese,   Peds  Hematology  (+) anemia , Hgb 6.5 Plt 129   Anesthesia Other Findings   Reproductive/Obstetrics                            Anesthesia Physical Anesthesia Plan  ASA: IV and emergent  Anesthesia Plan: General   Post-op Pain Management:    Induction: Intravenous  PONV Risk Score and Plan: Treatment may vary due to age or medical condition  Airway Management Planned: LMA  Additional Equipment: None  Intra-op Plan:   Post-operative Plan: Extubation in OR  Informed Consent: I have reviewed the patients History and Physical, chart, labs and discussed the procedure including the risks, benefits and alternatives for the proposed anesthesia with the patient or authorized representative who has indicated his/her understanding and acceptance.     Dental advisory given  Plan Discussed with:   Anesthesia Plan Comments:        Anesthesia Quick Evaluation

## 2019-10-11 NOTE — Transfer of Care (Signed)
Immediate Anesthesia Transfer of Care Note  Patient: Darrell [name]  Procedure(s) Performed: Procedure(s): CYSTOSCOPY CLOT EVACUATION , FULGRATION (N/A)  Patient Location: PACU  Anesthesia Type:General  Level of Consciousness: Alert, Awake, Oriented  Airway & Oxygen Therapy: Patient Spontanous Breathing  Post-op Assessment: Report given to RN  Post vital signs: Reviewed and stable  Last Vitals:  Vitals:   10/11/19 1217 10/11/19 1516  BP: (!) 143/68 125/71  Pulse: 76 73  Resp: 18 18  Temp: 36.6 C 36.9 C  SpO2: 15% 48%    Complications: No apparent anesthesia complications

## 2019-10-11 NOTE — Progress Notes (Signed)
Blood product continues to infuse. Pt transported to preoperative area.

## 2019-10-11 NOTE — Progress Notes (Signed)
Patient returned from pacu A&Ox4. Pt is resting comfortably. Will continue to monitor.

## 2019-10-11 NOTE — Progress Notes (Signed)
Patient ID: Darrell [name], male   DOB: June 16, 1941, 79 y.o.   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  He underwent fulguration today with all active bleeding at time of procedure.  He remains at high risk for recurrent bleeding.  No catheter was left indwelling as it was felt this may risk causing more bleeding right now by irritating friable surfaces in bladder/prostate.  Some mild hematuria may still be expected but if significant, urology should be called as patient will need a 3 way coude hematuria catheter replaced to manage.  In that setting, will then consider starting intravesical therapy such as alum irrigation.  If he does not have ongoing bleeding, will consider making arrangements for him to begin outpatient hyperbaric oxygen therapy for radiation cystitis to prevent future episodes.

## 2019-10-11 NOTE — Progress Notes (Signed)
HGB 6.5, will notify Dr.

## 2019-10-11 NOTE — Op Note (Signed)
Preoperative diagnosis: Hematuria  Postoperative diagnosis: Radiation cystitis with hematuria  Procedures: 1.  Cystoscopy 2.  Clot evacuation 3.  Fulguration of bladder and prostate  Surgeon: Pryor Curia MD  Anesthesia: General  Complications: None  EBL: Minimal  Intraoperative findings: The bladder was diffusely edematous consistent with his long-term indwelling catheter.  He did have evidence of erythematous and edematous tissue around the bladder neck and over the lateral wall of the bladder bilaterally but particularly in the left side of the bladder consistent with his prior surgical procedures and radiation therapy.  He did have what appeared to be necrotic tissue particularly within the prostate area.  He had active bleeding sites diffusely in the prostate, trigone of the bladder, and lateral walls of the bladder and bladder neck.  Indication: Darrell [name] is a 79 year old gentleman with a history of prostate cancer status post external beam radiation therapy approximately 4 years ago.  He has been hospitalized at [facility] for the last 3 to 4 weeks with persistent and recurrent gross hematuria.  He underwent multiple cystoscopic procedures and fulguration.  He also underwent bladder biopsies and resection of his bladder that were negative for malignancy.  He was transferred to [facility] due to an inability to control his bleeding at [facility].  He has been noted to have persistent bleeding requiring blood transfusion earlier today.  It was therefore recommended that he proceed to the operating room for further cystoscopic evaluation, clot evacuation, and other procedures as indicated.  We reviewed the potential risks, complications, and the expected recovery process and he gave informed consent.  Of note, although he has a DNR order, he did express his desire to proceed with treatment for this condition understanding that it is hopefully  reversible.  Description of procedure: The patient was taken to the operating room and a general anesthetic was administered.  He was given preoperative antibiotics, placed in the dorsolithotomy position, and prepped and draped in the usual sterile fashion.  Next, a preoperative timeout was performed.  Cystourethroscopy was then performed with the 22 French cystoscope sheath.  He was noted to have evidence of meatal erosion of the glans penis likely related to his multiple procedures and long-term indwelling catheter.  Internally, he was found to have no anterior urethral abnormalities.  The prostate demonstrated significant changes consistent with radiation treatment and prior procedures.  It appeared relatively necrotic with friable and blanched tissue.  There were multiple areas of active bleeding within the prostate.  Inspection of the bladder revealed a large amount of clot.  The cystoscope was removed and replaced with a 76 French resectoscope placed under direct vision.  A large amount of clot was evacuated from the bladder.  Reinspection revealed active bleeding sites within the bladder.  On the left side of the lateral bladder, there was exophytic tissue off the mucosal surface that was edematous, erythematous, with evidence of prior resection.  It did not visually appear to be consistent with urothelial carcinoma and by report this area had been previously biopsied/resected and pathology had indicated benign tissue.  These findings were consistent with radiation cystitis.  There were also numerous friable, active bleeding sites along the trigone of the bladder and the bladder neck.  Using monopolar loop cautery, all of these active bleeding sites were carefully cauterized.  These areas were extremely friable and oftentimes begin bleeding again after they were initially cauterized.  After a long and tedious procedure, all active bleeding sites were stopped.  The bladder was emptied and reinspected and  again no active bleeding sites were noted.  Indigo carmine had been administered and blue reflux was noted although the exact location of the ureteral orifice ease were difficult to identify due to edematous tissue likely consistent with radiation cystitis changes.  No cautery was utilized near the expected location of the ureteral orifices.  At this time, thought was given as to whether a catheter should be replaced.  It was felt that replacing a catheter would likely irritate the areas that were previously noted to be bleeding and might cause more harm than benefit.  The patient was therefore left without a catheter.  He was able to be awakened and transferred to the recovery unit in satisfactory condition.  He will be monitored and if he remains stable, we will plan to proceed with hospital discharge and outpatient hyperbaric oxygen therapy for treatment of radiation cystitis.  If he has recurrent bleeding, he may need a hematuria catheter replaced with plans to consider intravesical therapy such as alum.

## 2019-10-11 NOTE — Anesthesia Postprocedure Evaluation (Signed)
Anesthesia Post Note  Patient: Neale Marzette  Procedure(s) Performed: CYSTOSCOPY CLOT EVACUATION , FULGRATION (N/A )     Patient location during evaluation: PACU Anesthesia Type: General Level of consciousness: awake and alert Pain management: pain level controlled Vital Signs Assessment: post-procedure vital signs reviewed and stable Respiratory status: spontaneous breathing, nonlabored ventilation, respiratory function stable and patient connected to nasal cannula oxygen Cardiovascular status: blood pressure returned to baseline and stable Postop Assessment: no apparent nausea or vomiting Anesthetic complications: no    Last Vitals:  Vitals:   10/11/19 1745 10/11/19 1806  BP: 128/66 131/70  Pulse: 74 73  Resp: 18 16  Temp: (!) 36.4 C 36.5 C  SpO2: 99% 99%    Last Pain:  Vitals:   10/11/19 1806  TempSrc: Oral  PainSc:                  Barnet Glasgow

## 2019-10-12 LAB — TYPE AND SCREEN
ABO/RH(D): O POS
Antibody Screen: NEGATIVE
Unit division: 0
Unit division: 0

## 2019-10-12 LAB — CREATININE, SERUM
Creatinine, Ser: 3.31 mg/dL — ABNORMAL HIGH (ref 0.61–1.24)
GFR calc Af Amer: 20 mL/min — ABNORMAL LOW (ref >60)
GFR calc non Af Amer: 17 mL/min — ABNORMAL LOW (ref >60)

## 2019-10-12 LAB — CBC WITH DIFFERENTIAL/PLATELET
Abs Immature Granulocytes: 0.02 10*3/uL (ref 0.00–0.07)
Basophils Absolute: 0 10*3/uL (ref 0.0–0.1)
Basophils Relative: 0 %
Eosinophils Absolute: 0 10*3/uL (ref 0.0–0.5)
Eosinophils Relative: 0 %
HCT: 27.6 % — ABNORMAL LOW (ref 39.0–52.0)
Hemoglobin: 9 g/dL — ABNORMAL LOW (ref 13.0–17.0)
Immature Granulocytes: 0 %
Lymphocytes Relative: 19 %
Lymphs Abs: 0.9 10*3/uL (ref 0.7–4.0)
MCH: 30.8 pg (ref 26.0–34.0)
MCHC: 32.6 g/dL (ref 30.0–36.0)
MCV: 94.5 fL (ref 80.0–100.0)
Monocytes Absolute: 0.2 10*3/uL (ref 0.1–1.0)
Monocytes Relative: 4 %
Neutro Abs: 3.7 10*3/uL (ref 1.7–7.7)
Neutrophils Relative %: 77 %
Platelets: 126 10*3/uL — ABNORMAL LOW (ref 150–400)
RBC: 2.92 MIL/uL — ABNORMAL LOW (ref 4.22–5.81)
RDW: 15.5 % (ref 11.5–15.5)
WBC: 4.8 10*3/uL (ref 4.0–10.5)
nRBC: 0 % (ref 0.0–0.2)

## 2019-10-12 LAB — BPAM RBC
Blood Product Expiration Date: 202102102359
Blood Product Expiration Date: 202102102359
ISSUE DATE / TIME: 202101140851
ISSUE DATE / TIME: 202101141155
Unit Type and Rh: 5100
Unit Type and Rh: 5100

## 2019-10-12 LAB — GLUCOSE, CAPILLARY
Glucose-Capillary: 107 mg/dL — ABNORMAL HIGH (ref 70–99)
Glucose-Capillary: 114 mg/dL — ABNORMAL HIGH (ref 70–99)
Glucose-Capillary: 139 mg/dL — ABNORMAL HIGH (ref 70–99)
Glucose-Capillary: 161 mg/dL — ABNORMAL HIGH (ref 70–99)
Glucose-Capillary: 163 mg/dL — ABNORMAL HIGH (ref 70–99)

## 2019-10-12 NOTE — Progress Notes (Signed)
Patient ID: Darrell [name], male   DOB: [birth year], 79 y.o.   MRN: [id number]  Pt has voided 3 more times today.  Urine samples collected and are tea colored.  He has not passed any clots.  He feels he is emptying his bladder adequately.  - Continue to monitor samples of urine with "string of bottles" - If he continues to do well and Hgb is stable, he will be ok for discharge from a urologic standpoint and I have talked with the [place] and they have begun prior authorization to begin hyperbaric oxygen therapy hopefully next week as an outpatient to prevent recurrent bleeding - If he does have recurrent bleeding during his hospitalization that is significant (requiring catheter, dropping Hgb), he will likely need a 3 way hematuria catheter placed and I have talked with pharmacy [facility]) about options for intravesical treatment. 1% alum in sterile saline or water is available for continuous irrigation in 3 L bags.  With patient's CKD, he would be at risk for aluminum toxicity although systemic absorption should be low in bladder.  I have talked with the [facility] lab and serum aluminum levels can be checked but are sent outside with a 2-4 day turnaround.  Alternative options would be amicar (but with risk of thrombotic side effects and bladder must be clear of all clots) or carboprost intravesical irrigation which can be done although has increased expense.

## 2019-10-12 NOTE — Evaluation (Signed)
Physical Therapy Evaluation Patient Details Name: Darrell [name] MRN: [id number] DOB: [birth year] Today's Date: 10/12/2019   History of Present Illness  5-year male with history of CKD stage IV status post AV fistula placement, baseline creatinine between 3-4, diabetes mellitus type 2, HIV, hypertension, chronic normocytic anemia who was transferred from [facility] to here for the evaluation of persistent hematuria.  Pt underwent clot evacuation, fulguration of bleeding sites by urology on 10/11/2019  Clinical Impression  Pt admitted with above diagnosis.  Pt currently with functional limitations due to the deficits listed below (see PT Problem List). Pt will benefit from skilled PT to increase their independence and safety with mobility to allow discharge to the venue listed below.  Pt assisted with standing and transfer to recliner.  Pt with significant right lateral lean which he reports is mild at baseline however pt requiring increased assist for steadying today. Pt unable to ambulate at this time.  Pt from home alone and denies any falls.  Pt would benefit from SNF upon d/c.      Follow Up Recommendations SNF    Equipment Recommendations  None recommended by PT    Recommendations for Other Services       Precautions / Restrictions Precautions Precautions: Fall      Mobility  Bed Mobility Overal bed mobility: Needs Assistance Bed Mobility: Supine to Sit     Supine to sit: Min assist;HOB elevated     General bed mobility comments: increased time and effort, assist for trunk upright  Transfers Overall transfer level: Needs assistance Equipment used: Rolling walker (2 wheeled) Transfers: Sit to/from [facility] Sit to Stand: Max assist;Mod assist;+2 physical assistance Stand pivot transfers: Mod assist;+2 physical assistance       General transfer comment: pt attempted to stand x2 requiring max assist and unable to maintain due to right lateral  lean (pt reports some leaning at baseline however states worse today), requested RN in to assist for safety, pt required significant assist due to right leaning and unable to correct with cues  Ambulation/Gait                Stairs            Wheelchair Mobility    Modified Rankin (Stroke Patients Only)       Balance Overall balance assessment: Needs assistance Sitting-balance support: Bilateral upper extremity supported;Feet supported Sitting balance-Leahy Scale: Poor Sitting balance - Comments: right lateral lean, requires bil UE support Postural control: Right lateral lean Standing balance support: Bilateral upper extremity supported Standing balance-Leahy Scale: Zero Standing balance comment: requires external assist due to right lateral lean                             Pertinent Vitals/Pain Pain Assessment: No/denies pain    Home Living Family/patient expects to be discharged to:: Private residence Living Arrangements: Alone             Home Equipment: Gilford Rile - 2 wheels      Prior Function Level of Independence: Independent with assistive device(s)               Hand Dominance        Extremity/Trunk Assessment   Upper Extremity Assessment Upper Extremity Assessment: Generalized weakness    Lower Extremity Assessment Lower Extremity Assessment: Generalized weakness       Communication   Communication: No difficulties  Cognition Arousal/Alertness: Awake/alert Behavior During Therapy: [facility]  for tasks assessed/performed Overall Cognitive Status: Within Functional Limits for tasks assessed                                        General Comments      Exercises     Assessment/Plan    PT Assessment Patient needs continued PT services  PT Problem List Decreased strength;Decreased mobility;Decreased activity tolerance;Decreased balance;Decreased knowledge of use of DME;Decreased coordination       PT  Treatment Interventions DME instruction;Therapeutic activities;Gait training;Patient/family education;Functional mobility training;Balance training;Therapeutic exercise;Wheelchair mobility training    PT Goals (Current goals can be found in the Care Plan section)  Acute Rehab PT Goals PT Goal Formulation: With patient Time For Goal Achievement: 10/26/19 Potential to Achieve Goals: Good    Frequency Min 2X/week   Barriers to discharge        Co-evaluation               AM-PAC PT "6 Clicks" Mobility  Outcome Measure Help needed turning from your back to your side while in a flat bed without using bedrails?: A Little Help needed moving from lying on your back to sitting on the side of a flat bed without using bedrails?: A Little Help needed moving to and from a bed to a chair (including a wheelchair)?: A Lot Help needed standing up from a chair using your arms (e.g., wheelchair or bedside chair)?: A Lot Help needed to walk in hospital room?: Total Help needed climbing 3-5 steps with a railing? : Total 6 Click Score: 12    End of Session Equipment Utilized During Treatment: Gait belt Activity Tolerance: Patient tolerated treatment well Patient left: in chair;with chair alarm set;with call bell/phone within reach Nurse Communication: Mobility status PT Visit Diagnosis: Other abnormalities of gait and mobility (R26.89);Unsteadiness on feet (R26.81)    Time: 1022-1040 PT Time Calculation (min) (ACUTE ONLY): 18 min   Charges:   PT Evaluation $PT Eval Low Complexity: 1 Low         Kati PT, DPT Acute Rehabilitation Services Office: [phone]  Trena Platt 10/12/2019, 3:53 PM

## 2019-10-12 NOTE — Progress Notes (Signed)
PROGRESS NOTE    Darrell [name]  QMG:[id number] DOB: 03/24/1941 DOA: 10/09/2019 PCP: Cher Nakai, MD   Brief Narrative:  Patient is a 79-year male with history of CKD stage IV status post AV fistula placement, baseline creatinine between 3-4, diabetes mellitus type 2, HIV, hypertension, chronic normocytic anemia who was transferred from [facility] to here for the evaluation of persistent hematuria.  He was admitted to [facility] on the third week of December 2020 with complaints of abdomen pain, hematuria.  CT abdomen/pelvis at that time showed large clot within the urinary bladder. Urology was consulted and patient underwent cystoscopy on September 17, 2019 concerning for infiltrating bladder tumor or hemorrhagic cystitis.  Underwent fulguration and started on continuous bladder irrigation.  Despite which patient still had hematuria and had to be taken back to the OR on September 25, 2019 and underwent biopsy and resection and fulguration of the necrotic bladder tumor.  Pathology results showed no malignancy.  Due to persistent hematuria radiation oncology was consulted and at the time radiation oncology indicated no reason for radiation.  Patient was taken back to the OR on October 04, 2019 and underwent transurethral resection of the necrotic bladder tumor and transurethral resection of the adjacent prostate tissue.  Due to persistent hematuria Dr. Gilford Rile urologist on-call at [facility] was contacted and patient was transferred to further management.During the stay at [facility] patient has received total of 12 units of PRBC.  Also patient had received antibiotics for Pseudomonas UTI.  Has had neurogenic bladder for which Foley was placed.  Patient's creatinine was around 3.5 hemoglobin was around 7. 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 He underwent clot evacuation, fulguration of bleeding sites by  urology on 10/11/2019.  Assessment & Plan:   Principal Problem:   Hematuria Active Problems:   CKD (chronic kidney disease) stage 4, GFR 15-29 ml/min (HCC)   DM (diabetes mellitus), type 2 with renal complications (HCC)   Acute blood loss anemia   Essential hypertension   Thrombocytopenia (HCC)   Gross hematuria: Finding of necrotic bladder mass status post recent cystoscopy and fulguration and biopsy, biopsy did not show any malignancy  per the report.    Hematuria suspected to be from radiation cystitis.He underwent clot evacuation, fulguration of bleeding sites by urology on 10/11/2019.Foley removed. He will follow-up with urology as an outpatient after discharge.  History of recent Pseudomonas UTI: As per records from [facility].  We was on  Cefepime, but patient does not have any signs of urinary tract infection.  Cefepime discontinued.  Urine culture has been sent, will follow up the culture report.  Denies any dysuria.  Diabetes type 2: Currently on insulin.  Continue current regimen  Acute blood loss normocytic anemia: Hemoglobin dropped to the range of 6 on 10/11/19 .secondary to hematuria.  Transfused him with 2 units of PRBC on 10/11/19.  Several units of transfusion at [facility]..  Hypertension: Currently blood pressure stable.  Continue amlodipine.  On lisinopril at home which is on hold due to worsening renal function.  CKD stage IV: Has AV graft.  Follows with nephrology.  Baseline creatinine ranges from 3-4.  Currently kidney function at baseline  History of HIV: On antiretrovirals.  History of prostate cancer: Status post radiation therapy.  Currently in remission.  BPH/LUTS: Continue dutasteride and Myrbetriq  Debility/deconditioning/generalized weakness: We will request for physical therapy.  Prolonged hospitalization.   Nutrition  Problem: Increased nutrient needs Etiology: acute illness, chronic illness      DVT prophylaxis:SCD Code Status:  DNR Family Communication: None present at the bedside Disposition Plan: Home after urology clearance.  Pending PT evaluation   Consultants: Urology  Procedures:, Cystoscopy, TURBT  Antimicrobials:  Anti-infectives (From admission, onward)   Start     Dose/Rate Route Frequency Ordered Stop   10/10/19 1600  ceFEPIme (MAXIPIME) 2 g in sodium chloride 0.9 % 100 mL IVPB  Status:  Discontinued     2 g 200 mL/hr over 30 Minutes Intravenous Every 24 hours 10/10/19 0933 10/12/19 1309   10/10/19 1000  emtricitabine-rilpivir-tenofovir AF (ODEFSEY) 200-25-25 MG per tablet 1 tablet     1 tablet Oral Daily 10/10/19 0158     10/10/19 0400  ceFEPIme (MAXIPIME) 1 g in sodium chloride 0.9 % 100 mL IVPB  Status:  Discontinued     1 g 200 mL/hr over 30 Minutes Intravenous Every 24 hours 10/10/19 0226 10/10/19 0933      Subjective: Patient seen and examined at the bedside this morning.  Hemodynamically stable.  Feels better today.  Foley catheter has been removed.  Urine slowly clearing.  Objective: Vitals:   10/12/19 0413 10/12/19 0630 10/12/19 0812 10/12/19 1233  BP: 112/67  117/63 114/63  Pulse: 78  71 83  Resp: 16 18 16 16   Temp: 98.2 F (36.8 C)  100.3 F (37.9 C) (!) 97.5 F (36.4 C)  TempSrc: Oral   Oral  SpO2: 100%  100% 98%  Weight:  104 kg    Height:  5\' 9"  (1.753 m)      Intake/Output Summary (Last 24 hours) at 10/12/2019 1309 Last data filed at 10/12/2019 0900 Gross per 24 hour  Intake 10770 ml  Output 7033 ml  Net 3737 ml   Filed Weights   10/10/19 0200 10/12/19 0630  Weight: 104 kg 104 kg    Examination:  General exam: Not in distress, generalized weakness HEENT:PERRL, Ear/Nose normal on gross exam Respiratory system: Bilateral equal air entry, normal vesicular breath sounds, no wheezes or crackles  Cardiovascular system: S1 & S2 heard, RRR. No JVD, murmurs, rubs, gallops or clicks. No pedal edema. Gastrointestinal system: Abdomen is nondistended, soft and nontender.  No organomegaly or masses felt. Normal bowel sounds heard. Central nervous system: Alert and oriented. No focal neurological deficits. Extremities: No edema, no clubbing ,no cyanosis Skin: No rashes, lesions or ulcers,no icterus ,no pallor    Data Reviewed: I have personally reviewed following labs and imaging studies  CBC: Recent Labs  Lab 10/10/19 0021 10/10/19 0957 10/11/19 0340 10/11/19 2006 10/12/19 0319  WBC 6.2 6.3 4.4 7.6 4.8  NEUTROABS 3.9  --  2.6  --  3.7  HGB 7.2* 7.6* 6.5* 9.9* 9.0*  HCT 22.4* 23.3* 20.7* 29.9* 27.6*  MCV 98.2 97.5 98.6 92.9 94.5  PLT 133* 135* 129* 129* 694*   Basic Metabolic Panel: Recent Labs  Lab 10/10/19 0021 10/10/19 0957 10/11/19 0340 10/12/19 0319  NA 140 141 142  --   K 4.8 4.9 4.8  --   CL 112* 112* 113*  --   CO2 22 22 25   --   GLUCOSE 98 99 114*  --   BUN 45* 46* 46*  --   CREATININE 3.27* 3.39* 3.47* 3.31*  CALCIUM 7.5* 7.6* 7.5*  --    GFR: Estimated Creatinine Clearance: 22.6 mL/min (A) (by C-G formula based on SCr of 3.31 mg/dL (H)). Liver Function Tests: Recent Labs  Lab 10/10/19 0021  AST 24  ALT 26  ALKPHOS 53  BILITOT 0.6  PROT 4.2*  ALBUMIN 1.7*   No results for input(s): LIPASE, AMYLASE in the last 168 hours. No results for input(s): AMMONIA in the last 168 hours. Coagulation Profile: Recent Labs  Lab 10/10/19 0021  INR 1.1   Cardiac Enzymes: No results for input(s): CKTOTAL, CKMB, CKMBINDEX, TROPONINI in the last 168 hours. BNP (last 3 results) No results for input(s): PROBNP in the last 8760 hours. HbA1C: No results for input(s): HGBA1C in the last 72 hours. CBG: Recent Labs  Lab 10/11/19 2030 10/12/19 0005 10/12/19 0415 10/12/19 0755 10/12/19 1145  GLUCAP 132* 161* 139* 114* 107*   Lipid Profile: No results for input(s): CHOL, HDL, LDLCALC, TRIG, CHOLHDL, LDLDIRECT in the last 72 hours. Thyroid Function Tests: No results for input(s): TSH, T4TOTAL, FREET4, T3FREE, THYROIDAB in the  last 72 hours. Anemia Panel: No results for input(s): VITAMINB12, FOLATE, FERRITIN, TIBC, IRON, RETICCTPCT in the last 72 hours. Sepsis Labs: No results for input(s): PROCALCITON, LATICACIDVEN in the last 168 hours.  Recent Results (from the past 240 hour(s))  SARS CORONAVIRUS 2 (TAT 6-24 HRS) Nasopharyngeal Nasopharyngeal Swab     Status: None   Collection Time: 10/09/19 11:41 PM   Specimen: Nasopharyngeal Swab  Result Value Ref Range Status   SARS Coronavirus 2 NEGATIVE NEGATIVE Final    Comment: (NOTE) SARS-CoV-2 target nucleic acids are NOT DETECTED. The SARS-CoV-2 RNA is generally detectable in upper and lower respiratory specimens during the acute phase of infection. Negative results do not preclude SARS-CoV-2 infection, do not rule out co-infections with other pathogens, and should not be used as the sole basis for treatment or other patient management decisions. Negative results must be combined with clinical observations, patient history, and epidemiological information. The expected result is Negative. Fact Sheet for Patients: SugarRoll.be Fact Sheet for Healthcare Providers: https://www.woods-mathews.com/ This test is not yet approved or cleared by the [place] FDA and  has been authorized for detection and/or diagnosis of SARS-CoV-2 by FDA under an Emergency Use Authorization (EUA). This EUA will remain  in effect (meaning this test can be used) for the duration of the COVID-19 declaration under Section 56 4(b)(1) of the Act, 21 U.S.C. section 360bbb-3(b)(1), unless the authorization is terminated or revoked sooner. Performed at [facility], [place] [address], [place], [place] [address]   Surgical PCR screen     Status: None   Collection Time: 10/11/19  2:42 PM   Specimen: Nasal Mucosa; Nasal Swab  Result Value Ref Range Status   MRSA, PCR NEGATIVE NEGATIVE Final   Staphylococcus aureus NEGATIVE NEGATIVE Final     Comment: (NOTE) The Xpert SA Assay (FDA approved for NASAL specimens in patients 21 years of age and older), is one component of a comprehensive surveillance program. It is not intended to diagnose infection nor to guide or monitor treatment. Performed at [facility], [place] [address] [place], [place] [address]          Radiology Studies: No results found.      Scheduled Meds: . sodium chloride   Intravenous Once  . amLODipine  10 mg Oral Daily  . atorvastatin  20 mg Oral Daily  . buPROPion  150 mg Oral Daily  . calcium carbonate  1,250 mg Oral QAC breakfast  . Chlorhexidine Gluconate Cloth  6 each Topical Daily  . DULoxetine  60 mg Oral Daily  . dutasteride  0.5 mg Oral  Daily  . emtricitabine-rilpivir-tenofovir AF  1 tablet Oral Daily  . feeding supplement (ENSURE ENLIVE)  237 mL Oral BID BM  . feeding supplement (PRO-STAT SUGAR FREE 64)  30 mL Oral BID  . fesoterodine  8 mg Oral Daily  . insulin aspart  0-9 Units Subcutaneous Q4H  . insulin aspart protamine- aspart  80 Units Subcutaneous Q breakfast  . mirabegron ER  25 mg Oral Daily  . multivitamin with minerals  1 tablet Oral Daily  . omega-3 acid ethyl esters  1 g Oral Daily  . Ensure Max Protein  11 oz Oral Daily  . risperiDONE  1 mg Oral QHS  . sodium chloride flush  10-40 mL Intracatheter Q12H  . tamsulosin  0.4 mg Oral Daily  . trazodone  300 mg Oral QHS  . zolpidem  5 mg Oral QHS   Continuous Infusions:    LOS: 3 days    Time spent: 25 mins,More than 50% of that time was spent in counseling and/or coordination of care.      Shelly Coss, MD [facility] Pager [phone]  If 7PM-7AM, please contact night-coverage [email or web address] Password [facility] 10/12/2019, 1:09 PM

## 2019-10-12 NOTE — NC FL2 (Signed)
[place] LEVEL OF CARE SCREENING TOOL     IDENTIFICATION  Patient Name: Darrell [name] Birthdate: [birth year] Sex: male Admission Date (Current Location): 10/09/2019  [facility] and [place] Number:  Herbalist and Address:  [facility],  [place] [address] [place]      Provider Number: [id number]  Attending Physician Name and Address:  Shelly Coss, MD  Relative Name and Phone Number:       Current Level of Care: Hospital Recommended Level of Care: [place] Prior Approval Number:    Date Approved/Denied:   PASRR Number:    Discharge Plan: SNF    Current Diagnoses: Patient Active Problem List   Diagnosis Date Noted  . Hematuria 10/10/2019  . CKD (chronic kidney disease) stage 4, GFR 15-29 ml/min (HCC) 10/10/2019  . DM (diabetes mellitus), type 2 with renal complications ([place]) 24/82/5003  . Acute blood loss anemia 10/10/2019  . Essential hypertension 10/10/2019  . Thrombocytopenia ([place]) 10/10/2019  . Polyp in nasopharynx 12/22/2016  . Memory loss 12/08/2016  . Numbness and tingling of both feet 11/17/2016  . Unsteadiness 11/17/2016  . Facet arthropathy, lumbosacral 06/30/2016    Orientation RESPIRATION BLADDER Height & Weight     Self, Situation, Place  Normal Incontinent Weight: 104 kg Height:  5\' 9"  (175.3 cm)  BEHAVIORAL SYMPTOMS/MOOD NEUROLOGICAL BOWEL NUTRITION STATUS      Incontinent Diet(Heart Healthy)  AMBULATORY STATUS COMMUNICATION OF NEEDS Skin   Extensive Assist   Other (Comment)(Abrasion, Ecchymosis Right Knee, Groin Sacrum Buttocks, Skin damage)                       Personal Care Assistance Level of Assistance  Bathing, Feeding, Dressing Bathing Assistance: Maximum assistance Feeding assistance: Independent Dressing Assistance: Maximum assistance     Functional Limitations Info  Sight, Hearing Sight Info: Adequate Hearing Info: Impaired(HOH)      SPECIAL CARE FACTORS  FREQUENCY  PT (By licensed PT), OT (By licensed OT)     PT Frequency: Eval and Treat OT Frequency: Eval and Treat            Contractures Contractures Info: Not present    Additional Factors Info  Code Status, Allergies Code Status Info: DNR Allergies Info: Percodan Oxycodone-aspirin           Current Medications (10/12/2019):  This is the current hospital active medication list Current Facility-Administered Medications  Medication Dose Route Frequency Provider Last Rate Last Admin  . 0.9 %  sodium chloride infusion (Manually program via Guardrails IV Fluids)   Intravenous Once Raynelle Bring, MD      . acetaminophen (TYLENOL) tablet 650 mg  650 mg Oral Q6H PRN Raynelle Bring, MD   650 mg at 10/11/19 2235   Or  . acetaminophen (TYLENOL) suppository 650 mg  650 mg Rectal Q6H PRN Raynelle Bring, MD      . amLODipine (NORVASC) tablet 10 mg  10 mg Oral Daily Raynelle Bring, MD   10 mg at 10/12/19 0909  . atorvastatin (LIPITOR) tablet 20 mg  20 mg Oral Daily Raynelle Bring, MD   20 mg at 10/12/19 0906  . buPROPion (WELLBUTRIN XL) 24 hr tablet 150 mg  150 mg Oral Daily Raynelle Bring, MD   150 mg at 10/12/19 0906  . calcium carbonate (OS-CAL - dosed in mg of elemental calcium) tablet 1,250 mg  1,250 mg Oral QAC breakfast Raynelle Bring, MD   1,250 mg at 10/10/19 0900  .  Chlorhexidine Gluconate Cloth 2 % PADS 6 each  6 each Topical Daily Raynelle Bring, MD   6 each at 10/12/19 [id number]  . DULoxetine (CYMBALTA) DR capsule 60 mg  60 mg Oral Daily Raynelle Bring, MD   60 mg at 10/12/19 0906  . dutasteride (AVODART) capsule 0.5 mg  0.5 mg Oral Daily Raynelle Bring, MD   0.5 mg at 10/12/19 0906  . emtricitabine-rilpivir-tenofovir AF (ODEFSEY) 200-25-25 MG per tablet 1 tablet  1 tablet Oral Daily Raynelle Bring, MD   1 tablet at 10/12/19 0907  . feeding supplement (ENSURE ENLIVE) (ENSURE ENLIVE) liquid 237 mL  237 mL Oral BID BM Raynelle Bring, MD   237 mL at 10/12/19 1502  . feeding supplement  (PRO-STAT SUGAR FREE 64) liquid 30 mL  30 mL Oral BID Raynelle Bring, MD   30 mL at 10/12/19 1213  . fesoterodine (TOVIAZ) tablet 8 mg  8 mg Oral Daily Raynelle Bring, MD   8 mg at 10/12/19 0907  . insulin aspart protamine- aspart (NOVOLOG MIX 70/30) injection 80 Units  80 Units Subcutaneous Q breakfast Raynelle Bring, MD   80 Units at 10/12/19 0916  . ketotifen (ZADITOR) 0.025 % ophthalmic solution 1 drop  1 drop Both Eyes BID PRN Raynelle Bring, MD      . mirabegron ER [facility]) tablet 25 mg  25 mg Oral Daily Raynelle Bring, MD   25 mg at 10/12/19 0907  . multivitamin with minerals tablet 1 tablet  1 tablet Oral Daily Raynelle Bring, MD   1 tablet at 10/12/19 0906  . omega-3 acid ethyl esters (LOVAZA) capsule 1 g  1 g Oral Daily Raynelle Bring, MD   1 g at 10/12/19 0906  . ondansetron (ZOFRAN) tablet 4 mg  4 mg Oral Q6H PRN Raynelle Bring, MD       Or  . ondansetron [facility]) injection 4 mg  4 mg Intravenous Q6H PRN Raynelle Bring, MD   4 mg at 10/11/19 1714  . opium-belladonna (B&O SUPPRETTES) 16.2-60 MG suppository 1 suppository  1 suppository Rectal Q8H PRN Raynelle Bring, MD   1 suppository at 10/11/19 2235  . protein supplement (ENSURE MAX) liquid  11 oz Oral Daily Raynelle Bring, MD   11 oz at 10/11/19 2000  . risperiDONE (RISPERDAL) tablet 1 mg  1 mg Oral QHS Raynelle Bring, MD   1 mg at 10/11/19 2236  . sodium chloride flush (NS) 0.9 % injection 10-40 mL  10-40 mL Intracatheter Q12H Raynelle Bring, MD   10 mL at 10/11/19 2238  . sodium chloride flush (NS) 0.9 % injection 10-40 mL  10-40 mL Intracatheter PRN Raynelle Bring, MD      . tamsulosin [facility]) capsule 0.4 mg  0.4 mg Oral Daily Raynelle Bring, MD   0.4 mg at 10/12/19 0906  . traZODone (DESYREL) tablet 300 mg  300 mg Oral QHS Raynelle Bring, MD   300 mg at 10/11/19 2235  . zolpidem (AMBIEN) tablet 5 mg  5 mg Oral QHS Raynelle Bring, MD   5 mg at 10/11/19 2236     Discharge Medications: Please see discharge summary for a list  of discharge medications.  Relevant Imaging Results:  Relevant Lab Results:   Additional Information [id number]  Purcell Mouton, RN

## 2019-10-12 NOTE — Progress Notes (Signed)
   10/12/19 0630  Vitals  Resp 18  Height and Weight  Height 5\' 9"  (1.753 m)  Weight 104 kg  BSA (Calculated - sq m) 2.25 sq meters  BMI (Calculated) 33.84  Weight in (lb) to have BMI = 25 168.9  MEWS Score  MEWS Temp 0  MEWS Systolic 0  MEWS Pulse 0  MEWS RR 0  MEWS LOC 2  MEWS Score 2  MEWS Score Color Yellow

## 2019-10-12 NOTE — Progress Notes (Signed)
[place] was r/t LOC.PCP was notified

## 2019-10-12 NOTE — Progress Notes (Signed)
Getting pt. ready  for  discharge

## 2019-10-12 NOTE — Progress Notes (Signed)
Patient ID: Darrell [name], male   DOB: 1941-09-13, 79 y.o.   MRN: [id number]  1 Day Post-Op Subjective: S/P clot evacuation and fulguration of bleeding sites. Pt subjectively voiding well.  Feels that he is emptying.  No clots when voiding.  Objective: Vital signs in last 24 hours: Temp:  [97.5 F (36.4 C)-98.5 F (36.9 C)] 98.2 F (36.8 C) (01/15 0413) Pulse Rate:  [73-86] 78 (01/15 0413) Resp:  [14-18] 18 (01/15 0630) BP: (112-150)/(66-75) 112/67 (01/15 0413) SpO2:  [96 %-100 %] 100 % (01/15 0413) Weight:  [104 kg] 104 kg (01/15 0630)  Intake/Output from previous day: 01/14 0701 - 01/15 0700 In: 22945 [P.O.:360; I.V.:490; Blood:995; IV Piggyback:100] Out: 37902 [Urine:26525; Blood:8] Intake/Output this shift: No intake/output data recorded.  Physical Exam:  General: Alert and oriented Abd: Soft and ND  Lab Results: Recent Labs    10/11/19 0340 10/11/19 2006 10/12/19 0319  HGB 6.5* 9.9* 9.0*  HCT 20.7* 29.9* 27.6*   BMET Recent Labs    10/10/19 0957 10/10/19 0957 10/11/19 0340 10/12/19 0319  NA 141  --  142  --   K 4.9  --  4.8  --   CL 112*  --  113*  --   CO2 22  --  25  --   GLUCOSE 99  --  114*  --   BUN 46*  --  46*  --   CREATININE 3.39*   < > 3.47* 3.31*  CALCIUM 7.6*  --  7.5*  --    < > = values in this interval not displayed.     Assessment/Plan: 1) Radiation cystitis with hematuria: Hgb stable at 9.0 following transfusion yesterday.  Catheter left out after clot evacuation and fulguration of active bleeding sites last night with no active bleeding at end of the procedure.  Catheter was left out as it was felt this might contribute to higher risk of recurrent bleeding at this time.  Continue to monitor today with nursing to collect sample of each voided urine.  I expect some hematuria.  Serial Hgb measurements at least every 12 hours would be advisable.  I plan to initiate consultation for hyperbaric oxygen treatment as outpatient following treatment  if he is able to eventually be discharged without further acute urologic intervention needed.  However, if he again develops worsening hematuria with clots, etc (which is very possible), he will need replacement of 3 way hematuria catheter and initiation of intravesical therapy such as 1% alum irrigation.  Will try to avoid this as there is an increased risk of aluminum toxicity due to renal dysfunction but this may be necessary if significant bleeding resumes.   LOS: 3 days   Dutch Gray 10/12/2019, 7:23 AM

## 2019-10-13 LAB — GLUCOSE, CAPILLARY
Glucose-Capillary: 77 mg/dL (ref 70–99)
Glucose-Capillary: 164 mg/dL — ABNORMAL HIGH (ref 70–99)
Glucose-Capillary: 116 mg/dL — ABNORMAL HIGH (ref 70–99)
Glucose-Capillary: 127 mg/dL — ABNORMAL HIGH (ref 70–99)

## 2019-10-13 LAB — URINE CULTURE: Culture: <10000 — AB

## 2019-10-13 LAB — CBC WITH DIFFERENTIAL/PLATELET
Abs Immature Granulocytes: 0.03 10*3/uL (ref 0.00–0.07)
Basophils Absolute: 0 10*3/uL (ref 0.0–0.1)
Basophils Relative: 1 %
Eosinophils Absolute: 0.4 10*3/uL (ref 0.0–0.5)
Eosinophils Relative: 7 %
HCT: 25.2 % — ABNORMAL LOW (ref 39.0–52.0)
Hemoglobin: 7.9 g/dL — ABNORMAL LOW (ref 13.0–17.0)
Immature Granulocytes: 1 %
Lymphocytes Relative: 25 %
Lymphs Abs: 1.2 10*3/uL (ref 0.7–4.0)
MCH: 30.3 pg (ref 26.0–34.0)
MCHC: 31.3 g/dL (ref 30.0–36.0)
MCV: 96.6 fL (ref 80.0–100.0)
Monocytes Absolute: 0.4 10*3/uL (ref 0.1–1.0)
Monocytes Relative: 9 %
Neutro Abs: 2.8 10*3/uL (ref 1.7–7.7)
Neutrophils Relative %: 57 %
Platelets: 120 10*3/uL — ABNORMAL LOW (ref 150–400)
RBC: 2.61 MIL/uL — ABNORMAL LOW (ref 4.22–5.81)
RDW: 15.9 % — ABNORMAL HIGH (ref 11.5–15.5)
WBC: 4.9 10*3/uL (ref 4.0–10.5)
nRBC: 0 % (ref 0.0–0.2)

## 2019-10-13 LAB — PSA: Prostatic Specific Antigen: 0.02 ng/mL (ref 0.00–4.00)

## 2019-10-13 MED ORDER — INSULIN ASPART 100 UNIT/ML ~~LOC~~ SOLN
0.0000 [IU] | Freq: Three times a day (TID) | SUBCUTANEOUS | Status: DC
  Administered 2019-10-13 – 2019-10-15 (×4): 2 [IU] via SUBCUTANEOUS
  Administered 2019-10-15: 08:00:00 1 [IU] via SUBCUTANEOUS
  Administered 2019-10-16: 2 [IU] via SUBCUTANEOUS

## 2019-10-13 NOTE — Progress Notes (Signed)
Patient continued to c/o severe pain- pain became unbearable and pt was constantly calling for assistance. Stated he felt like he was "going to explode." Bladder scan done again showing only 447 ml. I&O cath was done using an 18 Fr coude cath per MD [facility]. No resistance was met and pt did not complain of any pain with insertion. 800 ml tea colored urine was drained. Only 1 small clot present with removal of catheter. Pt did have a small amount of blood draining from tip of penis- this was present before I&O also. Will continue to monitor closely.

## 2019-10-13 NOTE — Progress Notes (Signed)
Patient c/o sudden pressure to his groin/bladder. Bladder scan showing 544 ml at 1000. Pt previously was incontinent of urine, soaked through 3 bedpads and sheets. MD [facility] contacted for further instruction. Per MD, OK to continue to monitor patient and assess if he urinates on his own. Pt is only mildly uncomfortable at this time- wants to eat breakfast. If pt does become severely uncomfortable, MD Manny stated it is OK to I&O patient, but recommended using an 18 Fr Coude cath. Will continue to monitor closely.

## 2019-10-13 NOTE — Progress Notes (Signed)
Pt has not had a measurable amount of urine output since I&O cath at 1115, but pt did just urinate a large amount of incontinent urine after passing 2 large blood clots. Pt denies pain and discomfort at this time, stating he feels "so much better." Will continue to monitor.

## 2019-10-13 NOTE — Progress Notes (Signed)
PROGRESS NOTE    Darrell [name]  ZWC:[id number] DOB: 1941-05-04 DOA: 10/09/2019 PCP: Cher Nakai, MD   Brief Narrative:  Patient is a 79-year male with history of CKD stage IV status post AV fistula placement, baseline creatinine between 3-4, diabetes mellitus type 2, HIV, hypertension, chronic normocytic anemia who was transferred from [facility] to here for the evaluation of persistent hematuria.  He was admitted to [facility] on the third week of December 2020 with complaints of abdomen pain, hematuria.  CT abdomen/pelvis at that time showed large clot within the urinary bladder. Urology was consulted and patient underwent cystoscopy on September 17, 2019 concerning for infiltrating bladder tumor or hemorrhagic cystitis.  Underwent fulguration and started on continuous bladder irrigation.  Despite which patient still had hematuria and had to be taken back to the OR on September 25, 2019 and underwent biopsy and resection and fulguration of the necrotic bladder tumor.  Pathology results showed no malignancy.  Due to persistent hematuria radiation oncology was consulted and at the time radiation oncology indicated no reason for radiation.  Patient was taken back to the OR on October 04, 2019 and underwent transurethral resection of the necrotic bladder tumor and transurethral resection of the adjacent prostate tissue.  Due to persistent hematuria Dr. Gilford Rile urologist on-call at [facility] was contacted and patient was transferred to further management.During the stay at [facility] patient has received total of 12 units of PRBC.  Also patient had received antibiotics for Pseudomonas UTI.  Has had neurogenic bladder for which Foley was placed.  Patient's creatinine was around 3.5 hemoglobin was around 7. 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 He underwent clot evacuation, fulguration of bleeding sites by  urology on 10/11/2019. PT/OT recommended skilled nursing facility  Assessment & Plan:   Principal Problem:   Hematuria Active Problems:   CKD (chronic kidney disease) stage 4, GFR 15-29 ml/min (HCC)   DM (diabetes mellitus), type 2 with renal complications (HCC)   Acute blood loss anemia   Essential hypertension   Thrombocytopenia (HCC)   Gross hematuria: Finding of necrotic bladder mass status post recent cystoscopy and fulguration and biopsy, biopsy did not show any malignancy  per the report from [facility] .    Hematuria suspected to be from radiation cystitis.He underwent clot evacuation, fulguration of bleeding sites by urology on 10/11/2019 at [facility] hospital.Foley removed. He will follow-up with urology as an outpatient after discharge.  History of recent Pseudomonas UTI: As per records from [facility]  Cefepime, but patient does not have any signs of urinary tract infection.  Cefepime discontinued.  Urine culture has been sent, will follow up the culture report.  Denies any dysuria.  Diabetes type 2: Currently on insulin.  Continue current regimen  Acute blood loss normocytic anemia: Hemoglobin dropped to the range of 6 on 10/11/19 .secondary to hematuria.  Transfused him with 2 units of PRBC on 10/11/19.  Several units of transfusion at [facility] in the range of 7 today.  Check CBC tomorrow.  Hypertension: Currently blood pressure stable.  Continue amlodipine.  On lisinopril at home which was held due to worsening renal function.  CKD stage IV: Has AV graft.  Follows with nephrology.  Baseline creatinine ranges from 3-4.  Currently kidney function at baseline  History of HIV: On antiretrovirals.  History of prostate cancer: Status post radiation therapy.  Currently in remission.  BPH/LUTS:  Continue dutasteride and Myrbetriq  Debility/deconditioning/generalized weakness: PT/OT recommended skilled nursing facility.  Social Development worker, community  consulted  Nutrition Problem: Increased nutrient needs Etiology: acute illness, chronic illness      DVT prophylaxis:SCD Code Status: DNR Family Communication: None present at the bedside Disposition Plan: SNF after urology clearance  Consultants: Urology  Procedures:, Cystoscopy, TURBT  Antimicrobials:  Anti-infectives (From admission, onward)   Start     Dose/Rate Route Frequency Ordered Stop   10/10/19 1600  ceFEPIme (MAXIPIME) 2 g in sodium chloride 0.9 % 100 mL IVPB  Status:  Discontinued     2 g 200 mL/hr over 30 Minutes Intravenous Every 24 hours 10/10/19 0933 10/12/19 1309   10/10/19 1000  emtricitabine-rilpivir-tenofovir AF (ODEFSEY) 200-25-25 MG per tablet 1 tablet     1 tablet Oral Daily 10/10/19 0158     10/10/19 0400  ceFEPIme (MAXIPIME) 1 g in sodium chloride 0.9 % 100 mL IVPB  Status:  Discontinued     1 g 200 mL/hr over 30 Minutes Intravenous Every 24 hours 10/10/19 0226 10/10/19 0933      Subjective: Patient seen and examined the bedside this morning.  Hemodynamically stable.  No complaints during my evaluation but I noted that he was complaining of pain later this morning while passing urine and had to be in and out cathed with drainage of 800 mL of tea colored urine  Objective: Vitals:   10/12/19 1400 10/12/19 2100 10/13/19 0535 10/13/19 1332  BP: 120/63 121/69 131/70 123/71  Pulse: 84 75 67 85  Resp: 16 18 16 18   Temp: (!) 97.5 F (36.4 C) 98 F (36.7 C) 98.1 F (36.7 C) (!) 97.5 F (36.4 C)  TempSrc: Oral Oral Oral Oral  SpO2: 99% 100% 99% 98%  Weight:      Height:        Intake/Output Summary (Last 24 hours) at 10/13/2019 1358 Last data filed at 10/13/2019 1115 Gross per 24 hour  Intake 1220 ml  Output 1100 ml  Net 120 ml   Filed Weights   10/10/19 0200 10/12/19 0630  Weight: 104 kg 104 kg    Examination:   General exam: Not in distress, obese, generalized weakness Respiratory system: Bilateral equal air entry, normal vesicular  breath sounds, no wheezes or crackles  Cardiovascular system: S1 & S2 heard, RRR. No JVD, murmurs, rubs, gallops or clicks. Gastrointestinal system: Abdomen is nondistended, soft and nontender. No organomegaly or masses felt. Normal bowel sounds heard. Central nervous system: Alert and oriented. No focal neurological deficits. Extremities: No edema, no clubbing ,no cyanosis, distal peripheral pulses palpable. Skin: No rashes, lesions or ulcers,no icterus ,no pallor    Data Reviewed: I have personally reviewed following labs and imaging studies  CBC: Recent Labs  Lab 10/10/19 0021 10/10/19 0021 10/10/19 0957 10/11/19 0340 10/11/19 2006 10/12/19 0319 10/13/19 0432  WBC 6.2   < > 6.3 4.4 7.6 4.8 4.9  NEUTROABS 3.9  --   --  2.6  --  3.7 2.8  HGB 7.2*   < > 7.6* 6.5* 9.9* 9.0* 7.9*  HCT 22.4*   < > 23.3* 20.7* 29.9* 27.6* 25.2*  MCV 98.2   < > 97.5 98.6 92.9 94.5 96.6  PLT 133*   < > 135* 129* 129* 126* 120*   < > = values in this interval not displayed.   Basic Metabolic Panel: Recent Labs  Lab 10/10/19 0021 10/10/19 0957 10/11/19 0340 10/12/19 0319  NA 140 141 142  --  K 4.8 4.9 4.8  --   CL 112* 112* 113*  --   CO2 22 22 25   --   GLUCOSE 98 99 114*  --   BUN 45* 46* 46*  --   CREATININE 3.27* 3.39* 3.47* 3.31*  CALCIUM 7.5* 7.6* 7.5*  --    GFR: Estimated Creatinine Clearance: 22.6 mL/min (A) (by C-G formula based on SCr of 3.31 mg/dL (H)). Liver Function Tests: Recent Labs  Lab 10/10/19 0021  AST 24  ALT 26  ALKPHOS 53  BILITOT 0.6  PROT 4.2*  ALBUMIN 1.7*   No results for input(s): LIPASE, AMYLASE in the last 168 hours. No results for input(s): AMMONIA in the last 168 hours. Coagulation Profile: Recent Labs  Lab 10/10/19 0021  INR 1.1   Cardiac Enzymes: No results for input(s): CKTOTAL, CKMB, CKMBINDEX, TROPONINI in the last 168 hours. BNP (last 3 results) No results for input(s): PROBNP in the last 8760 hours. HbA1C: No results for input(s):  HGBA1C in the last 72 hours. CBG: Recent Labs  Lab 10/12/19 0755 10/12/19 1145 10/12/19 2103 10/13/19 0942 10/13/19 1147  GLUCAP 114* 107* 163* 77 164*   Lipid Profile: No results for input(s): CHOL, HDL, LDLCALC, TRIG, CHOLHDL, LDLDIRECT in the last 72 hours. Thyroid Function Tests: No results for input(s): TSH, T4TOTAL, FREET4, T3FREE, THYROIDAB in the last 72 hours. Anemia Panel: No results for input(s): VITAMINB12, FOLATE, FERRITIN, TIBC, IRON, RETICCTPCT in the last 72 hours. Sepsis Labs: No results for input(s): PROCALCITON, LATICACIDVEN in the last 168 hours.  Recent Results (from the past 240 hour(s))  SARS CORONAVIRUS 2 (TAT 6-24 HRS) Nasopharyngeal Nasopharyngeal Swab     Status: None   Collection Time: 10/09/19 11:41 PM   Specimen: Nasopharyngeal Swab  Result Value Ref Range Status   SARS Coronavirus 2 NEGATIVE NEGATIVE Final    Comment: (NOTE) SARS-CoV-2 target nucleic acids are NOT DETECTED. The SARS-CoV-2 RNA is generally detectable in upper and lower respiratory specimens during the acute phase of infection. Negative results do not preclude SARS-CoV-2 infection, do not rule out co-infections with other pathogens, and should not be used as the sole basis for treatment or other patient management decisions. Negative results must be combined with clinical observations, patient history, and epidemiological information. The expected result is Negative. Fact Sheet for Patients: SugarRoll.be Fact Sheet for Healthcare Providers: https://www.woods-mathews.com/ This test is not yet approved or cleared by the [place] FDA and  has been authorized for detection and/or diagnosis of SARS-CoV-2 by FDA under an Emergency Use Authorization (EUA). This EUA will remain  in effect (meaning this test can be used) for the duration of the COVID-19 declaration under Section 56 4(b)(1) of the Act, 21 U.S.C. section 360bbb-3(b)(1), unless  the authorization is terminated or revoked sooner. Performed at [facility], [place] [address], [place], [place] [address]   Surgical PCR screen     Status: None   Collection Time: 10/11/19  2:42 PM   Specimen: Nasal Mucosa; Nasal Swab  Result Value Ref Range Status   MRSA, PCR NEGATIVE NEGATIVE Final   Staphylococcus aureus NEGATIVE NEGATIVE Final    Comment: (NOTE) The Xpert SA Assay (FDA approved for NASAL specimens in patients 67 years of age and older), is one component of a comprehensive surveillance program. It is not intended to diagnose infection nor to guide or monitor treatment. Performed at [facility], [place] [address], [place], [place] [address]  Radiology Studies: No results found.      Scheduled Meds: . sodium chloride   Intravenous Once  . amLODipine  10 mg Oral Daily  . atorvastatin  20 mg Oral Daily  . buPROPion  150 mg Oral Daily  . calcium carbonate  1,250 mg Oral QAC breakfast  . Chlorhexidine Gluconate Cloth  6 each Topical Daily  . DULoxetine  60 mg Oral Daily  . dutasteride  0.5 mg Oral Daily  . emtricitabine-rilpivir-tenofovir AF  1 tablet Oral Daily  . feeding supplement (ENSURE ENLIVE)  237 mL Oral BID BM  . feeding supplement (PRO-STAT SUGAR FREE 64)  30 mL Oral BID  . fesoterodine  8 mg Oral Daily  . insulin aspart  0-9 Units Subcutaneous TID WC  . insulin aspart protamine- aspart  80 Units Subcutaneous Q breakfast  . mirabegron ER  25 mg Oral Daily  . multivitamin with minerals  1 tablet Oral Daily  . omega-3 acid ethyl esters  1 g Oral Daily  . Ensure Max Protein  11 oz Oral Daily  . risperiDONE  1 mg Oral QHS  . sodium chloride flush  10-40 mL Intracatheter Q12H  . tamsulosin  0.4 mg Oral Daily  . trazodone  300 mg Oral QHS  . zolpidem  5 mg Oral QHS   Continuous Infusions:    LOS: 4 days    Time spent: 25 mins,More than 50% of that time was spent in counseling and/or coordination of  care.      Shelly Coss, MD [facility] Pager [phone]  If 7PM-7AM, please contact night-coverage [email or web address] Password [email or web address] 10/13/2019, 1:58 PM

## 2019-10-13 NOTE — Progress Notes (Signed)
2 Days Post-Op   Subjective/Chief Complaint:  1 - Severe Radiation Cystitis / Gross Hematuria - h/o prostate / pelvic radiation 1016 with flair of radiation csytitis since 08/2019. Most recently s/p operative cysto / fulgeration here 10/11/19 with bleeding noted prostate fossa and bladder wall.  Hgb 1/16 - 7.9  2 - Prostate Cancer - s/p primary radiation 2016 in [place]. No recent PSA data for review. CT 08/2019 w/o adenopathy or obvious bone lesions.  3 - Stage 4 Renal Insufficiency - Cr 3-4's. Had AVF in place.  Today " Ed" is stable clinically. Some low grade fevers. Hgb down some but urine looks very good, likely some equilibration.    Objective: Vital signs in last 24 hours: Temp:  [97.5 F (36.4 C)-100.3 F (37.9 C)] 98.1 F (36.7 C) (01/16 0535) Pulse Rate:  [67-84] 67 (01/16 0535) Resp:  [16-18] 16 (01/16 0535) BP: (114-131)/(63-70) 131/70 (01/16 0535) SpO2:  [98 %-100 %] 99 % (01/16 0535) Last BM Date: 10/11/19  Intake/Output from previous day: 01/15 0701 - 01/16 0700 In: 8502 [P.O.:1340; I.V.:240] Out: 600 [Urine:600] Intake/Output this shift: No intake/output data recorded.  General appearance: sleepy but arousable Eyes: negative Nose: Nares normal. Septum midline. Mucosa normal. No drainage or sinus tenderness. Throat: lips, mucosa, and tongue normal; teeth and gums normal Back: symmetric, no curvature. ROM normal. No CVA tenderness. Resp: non-labored Cardio: Nl rate GI: soft, non-tender; bowel sounds normal; no masses,  no organomegaly and stigmata of prior large weight loss Male genitalia: normal, UNcirc'd. No phimosis / paraphimosis.  Extremities: extremities normal, atraumatic, no cyanosis or edema Skin: Skin color, texture, turgor normal. No rashes or lesions Neurologic: Grossly normal  Lab Results:  Recent Labs    10/12/19 0319 10/13/19 0432  WBC 4.8 4.9  HGB 9.0* 7.9*  HCT 27.6* 25.2*  PLT 126* 120*   BMET Recent Labs    10/10/19 0957  10/10/19 0957 10/11/19 0340 10/12/19 0319  NA 141  --  142  --   K 4.9  --  4.8  --   CL 112*  --  113*  --   CO2 22  --  25  --   GLUCOSE 99  --  114*  --   BUN 46*  --  46*  --   CREATININE 3.39*   < > 3.47* 3.31*  CALCIUM 7.6*  --  7.5*  --    < > = values in this interval not displayed.   PT/INR No results for input(s): LABPROT, INR in the last 72 hours. ABG No results for input(s): PHART, HCO3 in the last 72 hours.  Invalid input(s): PCO2, PO2  Studies/Results: No results found.  Anti-infectives: Anti-infectives (From admission, onward)   Start     Dose/Rate Route Frequency Ordered Stop   10/10/19 1600  ceFEPIme (MAXIPIME) 2 g in sodium chloride 0.9 % 100 mL IVPB  Status:  Discontinued     2 g 200 mL/hr over 30 Minutes Intravenous Every 24 hours 10/10/19 0933 10/12/19 1309   10/10/19 1000  emtricitabine-rilpivir-tenofovir AF (ODEFSEY) 200-25-25 MG per tablet 1 tablet     1 tablet Oral Daily 10/10/19 0158     10/10/19 0400  ceFEPIme (MAXIPIME) 1 g in sodium chloride 0.9 % 100 mL IVPB  Status:  Discontinued     1 g 200 mL/hr over 30 Minutes Intravenous Every 24 hours 10/10/19 0226 10/10/19 0933      Assessment/Plan:  1 - Severe Radiation Cystitis / Gross Hematuria - s/p  maximal intravesical operative treatment. Plan for hyperbaric O2 in outpatient setting to help reduce furhter, consider bladder agents if recurs with large cltos this admission.   2 - Prostate Cancer - PSA, T today. Consider [facility] if signs of active disease as may decrease prostate fossa bleeding.   3 - Stage 4 Renal Insufficiency - K and volume status acceptable, no acute dialysis indications.   Alexis Frock 10/13/2019

## 2019-10-14 LAB — CBC WITH DIFFERENTIAL/PLATELET
Abs Immature Granulocytes: 0.03 10*3/uL (ref 0.00–0.07)
Basophils Absolute: 0 10*3/uL (ref 0.0–0.1)
Basophils Relative: 0 %
Eosinophils Absolute: 0.4 10*3/uL (ref 0.0–0.5)
Eosinophils Relative: 7 %
HCT: 26.8 % — ABNORMAL LOW (ref 39.0–52.0)
Hemoglobin: 8.5 g/dL — ABNORMAL LOW (ref 13.0–17.0)
Immature Granulocytes: 1 %
Lymphocytes Relative: 19 %
Lymphs Abs: 1 10*3/uL (ref 0.7–4.0)
MCH: 30.5 pg (ref 26.0–34.0)
MCHC: 31.7 g/dL (ref 30.0–36.0)
MCV: 96.1 fL (ref 80.0–100.0)
Monocytes Absolute: 0.5 10*3/uL (ref 0.1–1.0)
Monocytes Relative: 9 %
Neutro Abs: 3.3 10*3/uL (ref 1.7–7.7)
Neutrophils Relative %: 64 %
Platelets: 122 10*3/uL — ABNORMAL LOW (ref 150–400)
RBC: 2.79 MIL/uL — ABNORMAL LOW (ref 4.22–5.81)
RDW: 15.2 % (ref 11.5–15.5)
WBC: 5.2 10*3/uL (ref 4.0–10.5)
nRBC: 0 % (ref 0.0–0.2)

## 2019-10-14 LAB — BASIC METABOLIC PANEL
Anion gap: 5 (ref 5–15)
BUN: 62 mg/dL — ABNORMAL HIGH (ref 8–23)
CO2: 23 mmol/L (ref 22–32)
Calcium: 7.7 mg/dL — ABNORMAL LOW (ref 8.9–10.3)
Chloride: 111 mmol/L (ref 98–111)
Creatinine, Ser: 3.46 mg/dL — ABNORMAL HIGH (ref 0.61–1.24)
GFR calc Af Amer: 19 mL/min — ABNORMAL LOW (ref >60)
GFR calc non Af Amer: 16 mL/min — ABNORMAL LOW (ref >60)
Glucose, Bld: 117 mg/dL — ABNORMAL HIGH (ref 70–99)
Potassium: 4.8 mmol/L (ref 3.5–5.1)
Sodium: 139 mmol/L (ref 135–145)

## 2019-10-14 LAB — TESTOSTERONE: Testosterone: 195 ng/dL — ABNORMAL LOW (ref 264–916)

## 2019-10-14 LAB — GLUCOSE, CAPILLARY
Glucose-Capillary: 115 mg/dL — ABNORMAL HIGH (ref 70–99)
Glucose-Capillary: 177 mg/dL — ABNORMAL HIGH (ref 70–99)
Glucose-Capillary: 153 mg/dL — ABNORMAL HIGH (ref 70–99)
Glucose-Capillary: 134 mg/dL — ABNORMAL HIGH (ref 70–99)

## 2019-10-14 LAB — HEMOGLOBIN A1C
Hgb A1c MFr Bld: 5.3 % (ref 4.8–5.6)
Mean Plasma Glucose: 105.41 mg/dL

## 2019-10-14 NOTE — Progress Notes (Signed)
PROGRESS NOTE    Darrell [name]  QQV:[id number] DOB: 06/09/1941 DOA: 10/09/2019 PCP: Cher Nakai, MD   Brief Narrative:  Patient is a 79-year male with history of CKD stage IV status post AV fistula placement, baseline creatinine between 3-4, diabetes mellitus type 2, HIV, hypertension, chronic normocytic anemia who was transferred from [facility] to here for the evaluation of persistent hematuria.  He was admitted to [facility] on the third week of December 2020 with complaints of abdomen pain, hematuria.  CT abdomen/pelvis at that time showed large clot within the urinary bladder. Urology was consulted and patient underwent cystoscopy on September 17, 2019 concerning for infiltrating bladder tumor or hemorrhagic cystitis.  Underwent fulguration and started on continuous bladder irrigation.  Despite which patient still had hematuria and had to be taken back to the OR on September 25, 2019 and underwent biopsy and resection and fulguration of the necrotic bladder tumor.  Pathology results showed no malignancy.  Due to persistent hematuria radiation oncology was consulted and at the time radiation oncology indicated no reason for radiation.  Patient was taken back to the OR on October 04, 2019 and underwent transurethral resection of the necrotic bladder tumor and transurethral resection of the adjacent prostate tissue.  Due to persistent hematuria Dr. Gilford Rile urologist on-call at [facility] was contacted and patient was transferred to further management.During the stay at [facility] patient has received total of 12 units of PRBC.  Also patient had received antibiotics for Pseudomonas UTI.  Has had neurogenic bladder for which Foley was placed.  Patient's creatinine was around 3.5 hemoglobin was around 7. 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 He underwent clot evacuation, fulguration of bleeding sites by  urology on 10/11/2019. PT/OT recommended skilled nursing facility. He is hemodynamically stable for discharge to skilled nursing facility from medical perspective.  Assessment & Plan:   Principal Problem:   Hematuria Active Problems:   CKD (chronic kidney disease) stage 4, GFR 15-29 ml/min (HCC)   DM (diabetes mellitus), type 2 with renal complications (HCC)   Acute blood loss anemia   Essential hypertension   Thrombocytopenia (HCC)   Gross hematuria: Finding of necrotic bladder mass status post recent cystoscopy and fulguration and biopsy, biopsy did not show any malignancy  per the report from [facility] .    Hematuria suspected to be from radiation cystitis.He underwent clot evacuation, fulguration of bleeding sites by urology on 10/11/2019 at [facility] hospital.Foley removed. He will follow-up with urology as an outpatient after discharge.  History of recent Pseudomonas UTI: As per records from [place]  Cefepime, but patient does not have any signs of urinary tract infection.  Cefepime discontinued.  Urine culture has been sent, no growth.  Denies any dysuria.  Diabetes type 2: Currently on insulin.  Continue current regimen  Acute blood loss normocytic anemia: Hemoglobin dropped to the range of 6 on 10/11/19 .secondary to hematuria.  Transfused him with 2 units of PRBC on 10/11/19.  Several units of transfusion at [facility] in the range of 7 today.  Hemoglobin stable in the range of 8.  Hypertension: Currently blood pressure stable.  Continue amlodipine.  On lisinopril at home which was held due to worsening renal function.  CKD stage IV: Has AV graft.  Follows with nephrology.  Baseline creatinine ranges from 3-4.  Currently kidney function at baseline  History of HIV: On antiretrovirals.  History  of prostate cancer: Status post radiation therapy.  Currently in remission.  BPH/LUTS: Continue dutasteride and Myrbetriq  Debility/deconditioning/generalized  weakness: PT/OT recommended skilled nursing facility.  Social Development worker, community consulted  Nutrition Problem: Increased nutrient needs Etiology: acute illness, chronic illness      DVT prophylaxis:SCD Code Status: DNR Family Communication: None present at the bedside Disposition Plan: SNF as soon as bed is available  Consultants: Urology  Procedures:, Cystoscopy, TURBT  Antimicrobials:  Anti-infectives (From admission, onward)   Start     Dose/Rate Route Frequency Ordered Stop   10/10/19 1600  ceFEPIme (MAXIPIME) 2 g in sodium chloride 0.9 % 100 mL IVPB  Status:  Discontinued     2 g 200 mL/hr over 30 Minutes Intravenous Every 24 hours 10/10/19 0933 10/12/19 1309   10/10/19 1000  emtricitabine-rilpivir-tenofovir AF (ODEFSEY) 200-25-25 MG per tablet 1 tablet     1 tablet Oral Daily 10/10/19 0158     10/10/19 0400  ceFEPIme (MAXIPIME) 1 g in sodium chloride 0.9 % 100 mL IVPB  Status:  Discontinued     1 g 200 mL/hr over 30 Minutes Intravenous Every 24 hours 10/10/19 0226 10/10/19 0933      Subjective: Patient seen and examined at the bedside this morning.  Hemodynamically stable.  Sleeping.  Denies any new complaints.  Denies any pain today.  Objective: Vitals:   10/13/19 0535 10/13/19 1332 10/13/19 2044 10/14/19 0519  BP: 131/70 123/71 124/63 127/62  Pulse: 67 85 75 75  Resp: 16 18 20 18   Temp: 98.1 F (36.7 C) (!) 97.5 F (36.4 C) 97.9 F (36.6 C) 97.8 F (36.6 C)  TempSrc: Oral Oral Oral Oral  SpO2: 99% 98% 100% 100%  Weight:      Height:        Intake/Output Summary (Last 24 hours) at 10/14/2019 1128 Last data filed at 10/14/2019 1043 Gross per 24 hour  Intake 250 ml  Output 0 ml  Net 250 ml   Filed Weights   10/10/19 0200 10/12/19 0630  Weight: 104 kg 104 kg    Examination:  General exam: Not in distress, obese, generalized weakness Respiratory system: Bilateral equal air entry, normal vesicular breath sounds, no wheezes or crackles   Cardiovascular system: S1 & S2 heard, RRR. No JVD, murmurs, rubs, gallops or clicks. Gastrointestinal system: Abdomen is nondistended, soft and nontender. No organomegaly or masses felt. Normal bowel sounds heard. Central nervous system: Alert and oriented. No focal neurological deficits. Extremities: No edema, no clubbing ,no cyanosis Skin: No rashes, lesions or ulcers,no icterus ,no pallor    Data Reviewed: I have personally reviewed following labs and imaging studies  CBC: Recent Labs  Lab 10/10/19 0021 10/10/19 0957 10/11/19 0340 10/11/19 2006 10/12/19 0319 10/13/19 0432 10/14/19 0501  WBC 6.2   < > 4.4 7.6 4.8 4.9 5.2  NEUTROABS 3.9  --  2.6  --  3.7 2.8 3.3  HGB 7.2*   < > 6.5* 9.9* 9.0* 7.9* 8.5*  HCT 22.4*   < > 20.7* 29.9* 27.6* 25.2* 26.8*  MCV 98.2   < > 98.6 92.9 94.5 96.6 96.1  PLT 133*   < > 129* 129* 126* 120* 122*   < > = values in this interval not displayed.   Basic Metabolic Panel: Recent Labs  Lab 10/10/19 0021 10/10/19 0957 10/11/19 0340 10/12/19 0319 10/14/19 0501  NA 140 141 142  --  139  K 4.8 4.9 4.8  --  4.8  CL 112* 112* 113*  --  111  CO2 22 22 25   --  23  GLUCOSE 98 99 114*  --  117*  BUN 45* 46* 46*  --  62*  CREATININE 3.27* 3.39* 3.47* 3.31* 3.46*  CALCIUM 7.5* 7.6* 7.5*  --  7.7*   GFR: Estimated Creatinine Clearance: 21.6 mL/min (A) (by C-G formula based on SCr of 3.46 mg/dL (H)). Liver Function Tests: Recent Labs  Lab 10/10/19 0021  AST 24  ALT 26  ALKPHOS 53  BILITOT 0.6  PROT 4.2*  ALBUMIN 1.7*   No results for input(s): LIPASE, AMYLASE in the last 168 hours. No results for input(s): AMMONIA in the last 168 hours. Coagulation Profile: Recent Labs  Lab 10/10/19 0021  INR 1.1   Cardiac Enzymes: No results for input(s): CKTOTAL, CKMB, CKMBINDEX, TROPONINI in the last 168 hours. BNP (last 3 results) No results for input(s): PROBNP in the last 8760 hours. HbA1C: No results for input(s): HGBA1C in the last 72  hours. CBG: Recent Labs  Lab 10/13/19 1147 10/13/19 1656 10/13/19 2050 10/14/19 0728 10/14/19 1103  GLUCAP 164* 116* 127* 115* 177*   Lipid Profile: No results for input(s): CHOL, HDL, LDLCALC, TRIG, CHOLHDL, LDLDIRECT in the last 72 hours. Thyroid Function Tests: No results for input(s): TSH, T4TOTAL, FREET4, T3FREE, THYROIDAB in the last 72 hours. Anemia Panel: No results for input(s): VITAMINB12, FOLATE, FERRITIN, TIBC, IRON, RETICCTPCT in the last 72 hours. Sepsis Labs: No results for input(s): PROCALCITON, LATICACIDVEN in the last 168 hours.  Recent Results (from the past 240 hour(s))  SARS CORONAVIRUS 2 (TAT 6-24 HRS) Nasopharyngeal Nasopharyngeal Swab     Status: None   Collection Time: 10/09/19 11:41 PM   Specimen: Nasopharyngeal Swab  Result Value Ref Range Status   SARS Coronavirus 2 NEGATIVE NEGATIVE Final    Comment: (NOTE) SARS-CoV-2 target nucleic acids are NOT DETECTED. The SARS-CoV-2 RNA is generally detectable in upper and lower respiratory specimens during the acute phase of infection. Negative results do not preclude SARS-CoV-2 infection, do not rule out co-infections with other pathogens, and should not be used as the sole basis for treatment or other patient management decisions. Negative results must be combined with clinical observations, patient history, and epidemiological information. The expected result is Negative. Fact Sheet for Patients: SugarRoll.be Fact Sheet for Healthcare Providers: https://www.woods-mathews.com/ This test is not yet approved or cleared by the [place] FDA and  has been authorized for detection and/or diagnosis of SARS-CoV-2 by FDA under an Emergency Use Authorization (EUA). This EUA will remain  in effect (meaning this test can be used) for the duration of the COVID-19 declaration under Section 56 4(b)(1) of the Act, 21 U.S.C. section 360bbb-3(b)(1), unless the authorization  is terminated or revoked sooner. Performed at [facility], [place] [address] [place], [place] [address]   Surgical PCR screen     Status: None   Collection Time: 10/11/19  2:42 PM   Specimen: Nasal Mucosa; Nasal Swab  Result Value Ref Range Status   MRSA, PCR NEGATIVE NEGATIVE Final   Staphylococcus aureus NEGATIVE NEGATIVE Final    Comment: (NOTE) The Xpert SA Assay (FDA approved for NASAL specimens in patients 17 years of age and older), is one component of a comprehensive surveillance program. It is not intended to diagnose infection nor to guide or monitor treatment. Performed at [facility], [place] [address] [place],  [address]   Culture, Urine     Status: Abnormal   Collection Time: 10/12/19  4:27 PM  Specimen: Urine, Random  Result Value Ref Range Status   Specimen Description   Final    URINE, RANDOM Performed at [facility] [address] [place], [place] [address]    Special Requests   Final    NONE Performed at [facility], [place] [address] [place], [place] [address]    Culture (A)  Final    <10,000 COLONIES/mL INSIGNIFICANT GROWTH Performed at [facility] [address], [place], [place] [address]    Report Status 10/13/2019 FINAL  Final         Radiology Studies: No results found.      Scheduled Meds: . sodium chloride   Intravenous Once  . amLODipine  10 mg Oral Daily  . atorvastatin  20 mg Oral Daily  . buPROPion  150 mg Oral Daily  . calcium carbonate  1,250 mg Oral QAC breakfast  . Chlorhexidine Gluconate Cloth  6 each Topical Daily  . DULoxetine  60 mg Oral Daily  . dutasteride  0.5 mg Oral Daily  . emtricitabine-rilpivir-tenofovir AF  1 tablet Oral Daily  . feeding supplement (ENSURE ENLIVE)  237 mL Oral BID BM  . feeding supplement (PRO-STAT SUGAR FREE 64)  30 mL Oral BID  . fesoterodine  8 mg Oral Daily  . insulin aspart  0-9 Units Subcutaneous TID WC  .  insulin aspart protamine- aspart  80 Units Subcutaneous Q breakfast  . mirabegron ER  25 mg Oral Daily  . multivitamin with minerals  1 tablet Oral Daily  . omega-3 acid ethyl esters  1 g Oral Daily  . Ensure Max Protein  11 oz Oral Daily  . risperiDONE  1 mg Oral QHS  . sodium chloride flush  10-40 mL Intracatheter Q12H  . tamsulosin  0.4 mg Oral Daily  . trazodone  300 mg Oral QHS  . zolpidem  5 mg Oral QHS   Continuous Infusions:    LOS: 5 days    Time spent: 25 mins,More than 50% of that time was spent in counseling and/or coordination of care.      Shelly Coss, MD [facility] Pager [phone]  If 7PM-7AM, please contact night-coverage [email or web address] Password [facility] 10/14/2019, 11:28 AM

## 2019-10-14 NOTE — Progress Notes (Signed)
3 Days Post-Op   Subjective/Chief Complaint:   1 - Severe Radiation Cystitis / Gross Hematuria - h/o prostate / pelvic radiation 1016 with flair of radiation csytitis since 08/2019. Most recently s/p operative cysto / fulgeration here 10/11/19 with bleeding noted prostate fossa and bladder wall. Currently no foley.   Hgb 1/16 - 7.9 Hgb 1/17 - 8.5  2 - Prostate Cancer - s/p primary radiation 2016 in [place]. PSA 09/2019 .02 / T 195. CT 08/2019 w/o adenopathy or obvious bone lesions.  3 - Stage 4 Renal Insufficiency - Cr 3-4's. Had AVF in place.  Today " Ed" is stable. Hgb back up some w/o transfusion supporting equilibration. IO cath x 1 yesterday but otherwise voiding with minimal clot. Racked urine remains mostly tea colored.    Objective: Vital signs in last 24 hours: Temp:  [97.5 F (36.4 C)-97.9 F (36.6 C)] 97.8 F (36.6 C) (01/17 0519) Pulse Rate:  [75-85] 75 (01/17 0519) Resp:  [18-20] 18 (01/17 0519) BP: (123-127)/(62-71) 127/62 (01/17 0519) SpO2:  [98 %-100 %] 100 % (01/17 0519) Last BM Date: 10/13/19  Intake/Output from previous day: 01/16 0701 - 01/17 0700 In: 480 [P.O.:480] Out: 800 [Urine:800] Intake/Output this shift: No intake/output data recorded.  General appearance: sleepy but arousable Eyes: negative Nose: Nares normal. Septum midline. Mucosa normal. No drainage or sinus tenderness. Throat: lips, mucosa, and tongue normal; teeth and gums normal Back: symmetric, no curvature. ROM normal. No CVA tenderness. Resp: non-labored Cardio: Nl rate GI: soft, non-tender; bowel sounds normal; no masses,  no organomegaly and stigmata of prior large weight loss Male genitalia: normal, UNcirc'd. No phimosis / paraphimosis.  Extremities: extremities normal, atraumatic, no cyanosis or edema Skin: Skin color, texture, turgor normal. No rashes or lesions Neurologic: Grossly normal  Lab Results:  Recent Labs    10/13/19 0432 10/14/19 0501  WBC 4.9 5.2  HGB 7.9*  8.5*  HCT 25.2* 26.8*  PLT 120* 122*   BMET Recent Labs    10/12/19 0319 10/14/19 0501  NA  --  139  K  --  4.8  CL  --  111  CO2  --  23  GLUCOSE  --  117*  BUN  --  62*  CREATININE 3.31* 3.46*  CALCIUM  --  7.7*   PT/INR No results for input(s): LABPROT, INR in the last 72 hours. ABG No results for input(s): PHART, HCO3 in the last 72 hours.  Invalid input(s): PCO2, PO2  Studies/Results: No results found.  Anti-infectives: Anti-infectives (From admission, onward)   Start     Dose/Rate Route Frequency Ordered Stop   10/10/19 1600  ceFEPIme (MAXIPIME) 2 g in sodium chloride 0.9 % 100 mL IVPB  Status:  Discontinued     2 g 200 mL/hr over 30 Minutes Intravenous Every 24 hours 10/10/19 0933 10/12/19 1309   10/10/19 1000  emtricitabine-rilpivir-tenofovir AF (ODEFSEY) 200-25-25 MG per tablet 1 tablet     1 tablet Oral Daily 10/10/19 0158     10/10/19 0400  ceFEPIme (MAXIPIME) 1 g in sodium chloride 0.9 % 100 mL IVPB  Status:  Discontinued     1 g 200 mL/hr over 30 Minutes Intravenous Every 24 hours 10/10/19 0226 10/10/19 0933      Assessment/Plan:  1 - Severe Radiation Cystitis / Gross Hematuria - s/p maximal intravesical operative treatment. Plan for hyperbaric O2 in outpatient setting to help reduce furhter, consider bladder agents if recurs with large cltos this admission. PRN IO cath with 18 coude  remains best management at this point as indwelling catheter more likely to stir up new bleeding.   2 - Prostate Cancer - under good biochemical control I do not feel hormones would add much.   3 - Stage 4 Renal Insufficiency - K and volume status acceptable, no acute dialysis indications.   Alexis Frock 10/14/2019

## 2019-10-15 LAB — CBC WITH DIFFERENTIAL/PLATELET
Abs Immature Granulocytes: 0.03 10*3/uL (ref 0.00–0.07)
Basophils Absolute: 0 10*3/uL (ref 0.0–0.1)
Basophils Relative: 0 %
Eosinophils Absolute: 0.3 10*3/uL (ref 0.0–0.5)
Eosinophils Relative: 6 %
HCT: 28 % — ABNORMAL LOW (ref 39.0–52.0)
Hemoglobin: 8.8 g/dL — ABNORMAL LOW (ref 13.0–17.0)
Immature Granulocytes: 1 %
Lymphocytes Relative: 26 %
Lymphs Abs: 1.3 10*3/uL (ref 0.7–4.0)
MCH: 30.4 pg (ref 26.0–34.0)
MCHC: 31.4 g/dL (ref 30.0–36.0)
MCV: 96.9 fL (ref 80.0–100.0)
Monocytes Absolute: 0.5 10*3/uL (ref 0.1–1.0)
Monocytes Relative: 10 %
Neutro Abs: 2.8 10*3/uL (ref 1.7–7.7)
Neutrophils Relative %: 57 %
Platelets: 126 10*3/uL — ABNORMAL LOW (ref 150–400)
RBC: 2.89 MIL/uL — ABNORMAL LOW (ref 4.22–5.81)
RDW: 15 % (ref 11.5–15.5)
WBC: 4.9 10*3/uL (ref 4.0–10.5)
nRBC: 0 % (ref 0.0–0.2)

## 2019-10-15 LAB — GLUCOSE, CAPILLARY
Glucose-Capillary: 128 mg/dL — ABNORMAL HIGH (ref 70–99)
Glucose-Capillary: 158 mg/dL — ABNORMAL HIGH (ref 70–99)
Glucose-Capillary: 117 mg/dL — ABNORMAL HIGH (ref 70–99)
Glucose-Capillary: 147 mg/dL — ABNORMAL HIGH (ref 70–99)

## 2019-10-15 LAB — SARS CORONAVIRUS 2 (TAT 6-24 HRS): SARS Coronavirus 2: NEGATIVE

## 2019-10-15 MED ORDER — INSULIN NPH ISOPHANE & REGULAR (70-30) 100 UNIT/ML ~~LOC~~ SUSP: 80.0000 [IU] | Freq: Every day | SUBCUTANEOUS | 11 refills | Status: DC

## 2019-10-15 NOTE — Discharge Summary (Addendum)
Physician Discharge Summary  Darrell [name] PYK:[id number] DOB: 12-Oct-1940 DOA: 10/09/2019  PCP: Cher Nakai, MD  Admit date: 10/09/2019 Discharge date: 10/16/19 Admitted From: Home Disposition:  SNF  Discharge Condition:Stable CODE STATUS:DNR Diet recommendation: Heart Healthy  Brief/Interim Summary: Patient is a 79-year male with history of CKD stage IV status post AV fistula placement, baseline creatinine between 3-4, diabetes mellitus type 2, HIV, hypertension, chronic normocytic anemia who was transferred from [facility] to here for the evaluation of persistent hematuria.  He was admitted to [facility] on the third week of December 2020 with complaints of abdomen pain, hematuria.  CT abdomen/pelvis at that time showed large clot within the urinary bladder.Urology was consulted and patient underwent cystoscopy on September 17, 2019 concerning for infiltrating bladder tumor or hemorrhagic cystitis. Underwent fulguration and started on continuous bladder irrigation. Despite which patient still had hematuria and had to be taken back to the OR on September 25, 2019 and underwent biopsy and resection and fulguration of the necrotic bladder tumor. Pathology results showed no malignancy. Due to persistent hematuria radiation oncology was consulted and at the time radiation oncology indicated no reason for radiation. Patient was taken back to the OR on October 04, 2019 and underwent transurethral resection of the necrotic bladder tumor and transurethral resection of the adjacent prostate tissue. Due to persistent hematuria Dr. Gust Rung on-call at [facility] was contacted and patient was transferred to further management.During the stay at [facility] patient has received total of 12 units of PRBC. Also patient had received antibiotics for Pseudomonas UTI. Has had neurogenic bladder for which Foley was placed. Patient's creatinine was around 3.5 hemoglobin was around  7. 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 He underwent clot evacuation, fulguration of bleeding sites by urology on 10/11/2019. PT/OT recommended skilled nursing facility.He has been referred to wound care center by urology and they will be working to schedule him to begin hyperbaric oxygen therapy to prevent future episodes of radiation cystitis. He is hemodynamically stable for discharge to skilled nursing facility from medical perspective.  Following problems were adressed during his hospitalization:  Gross hematuria: Finding of necrotic bladder mass status post recent cystoscopy and fulguration and biopsy, biopsy did not show any malignancy  per the report from [facility] .   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 He will follow-up with urology as an outpatient after discharge.Marland KitchenHe has been referred to wound care center by urology and they will be working to schedule him to begin hyperbaric oxygen therapy to prevent future episodes of radiation cystitis.  History of recent Pseudomonas UTI: As per records from [place]  Cefepime, but patient does not have any signs of urinary tract infection.  Cefepime discontinued.  Urine culture did not show any growth.  Denies any dysuria.  Diabetes type 2: Currently on insulin.  He was on 70/30 insulin at home.  Acute blood loss normocytic anemia: Hemoglobin dropped to the range of 6 on 10/11/19 .secondary to hematuria.  Transfused him with 2 units of PRBC on 10/11/19.  Several units of transfusion at [facility] in the range of 8  .  Check CBC in a week.  Hypertension: Currently blood pressure stable.  Continue amlodipine.  On lisinopril at home which was held due to worsening renal function.  Blood pressure stable without  lisinopril.  CKD stage IV: Has AV graft.  Follows with nephrology.  Baseline creatinine ranges from 3-4.  Currently kidney function at baseline  History of HIV: On antiretrovirals.  History of prostate cancer: Status post radiation therapy.  Currently in remission.  BPH/LUTS: Continue dutasteride and Myrbetriq  Debility/deconditioning/generalized weakness: PT/OT recommended skilled nursing facility.    Discharge Diagnoses:  Principal Problem:   Hematuria Active Problems:   CKD (chronic kidney disease) stage 4, GFR 15-29 ml/min (HCC)   DM (diabetes mellitus), type 2 with renal complications (HCC)   Acute blood loss anemia   Essential hypertension   Thrombocytopenia (HCC)    Discharge Instructions  Discharge Instructions    Diet - low sodium heart healthy   Complete by: As directed    Discharge instructions   Complete by: As directed    1)Please follow up with Dr Adrian Prows in 4 weeks. 2)Do CBC and BMP tests in a week.   Increase activity slowly   Complete by: As directed      Allergies as of 10/16/2019      Reactions   Percodan [oxycodone-aspirin] Other (See Comments)   Prickly feeling in hands, arms and torso      Medication List    STOP taking these medications   lisinopril 40 MG tablet Commonly known as: ZESTRIL     TAKE these medications   amLODipine 10 MG tablet Commonly known as: NORVASC Take 10 mg by mouth daily.   atorvastatin 20 MG tablet Commonly known as: LIPITOR TAKE ONE TABLET BY MOUTH EVERY DAY   buPROPion 150 MG 24 hr tablet Commonly known as: WELLBUTRIN XL Take 150 mg by mouth daily.   CALCIUM 600 PO Take 600 mg by mouth daily.   DULoxetine 60 MG capsule Commonly known as: CYMBALTA Take 60 mg by mouth daily.   dutasteride 0.5 MG capsule Commonly known as: AVODART Take 0.5 mg by mouth daily.   fesoterodine 8 MG Tb24 tablet Commonly known as: TOVIAZ Take 8 mg by mouth daily.   FIBER PO Take 1 tablet by mouth daily.    Fish Oil 1000 MG Caps Take 1,000 mg by mouth 2 (two) times a day.   insulin NPH-regular Human (70-30) 100 UNIT/ML injection Inject 80 Units into the skin daily. What changed: how much to take   ketotifen 0.025 % ophthalmic solution Commonly known as: ZADITOR Place 1 drop into both eyes 2 (two) times daily as needed (irritated eyes).   Melatonin 5 MG Tabs Take 10 mg by mouth at bedtime.   mirabegron ER 25 MG Tb24 tablet Commonly known as: MYRBETRIQ Take 25 mg by mouth daily.   multivitamin with minerals Tabs tablet Take 1 tablet by mouth daily.   Odefsey 200-25-25 MG Tabs tablet Generic drug: emtricitabine-rilpivir-tenofovir AF Take 1 tablet by mouth daily.   risperiDONE 1 MG tablet Commonly known as: RISPERDAL Take 1 mg by mouth at bedtime.   tamsulosin 0.4 MG Caps capsule Commonly known as: FLOMAX TAKE ONE CAPSULE BY MOUTH EVERY DAY   traMADol 50 MG tablet Commonly known as: Ultram Take 1 tablet (50 mg total) by mouth every 6 (six) hours as needed.   trazodone 300 MG tablet Commonly known as: DESYREL Take 300 mg by mouth at bedtime.   zolpidem 10 MG tablet Commonly known as: AMBIEN Take 10 mg by mouth at bedtime.       Allergies  Allergen Reactions  . Percodan [Oxycodone-Aspirin] Other (See Comments)    Prickly feeling in hands, arms  and torso    Consultations:  Urology   Procedures/Studies: No results found.    Subjective: Patient seen and examined at the bedside this morning.  Hemodynamically for discharge as soon as bed is available in the skilled nursing facility.  Discharge Exam: Vitals:   10/15/19 2045 10/16/19 0429  BP: 131/81 134/74  Pulse: 66 72  Resp: 18 14  Temp: 98.1 F (36.7 C) (!) 97.3 F (36.3 C)  SpO2: 100% 100%   Vitals:   10/15/19 0843 10/15/19 1325 10/15/19 2045 10/16/19 0429  BP: 135/74 138/77 131/81 134/74  Pulse:  90 66 72  Resp:  16 18 14   Temp:  98.2 F (36.8 C) 98.1 F (36.7 C) (!) 97.3 F (36.3 C)   TempSrc:  Oral Oral Oral  SpO2:  100% 100% 100%  Weight:      Height:        General: Pt is alert, awake, not in acute distress Cardiovascular: RRR, S1/S2 +, no rubs, no gallops Respiratory: CTA bilaterally, no wheezing, no rhonchi Abdominal: Soft, NT, ND, bowel sounds + Extremities: no edema, no cyanosis    The results of significant diagnostics from this hospitalization (including imaging, microbiology, ancillary and laboratory) are listed below for reference.     Microbiology: Recent Results (from the past 240 hour(s))  SARS CORONAVIRUS 2 (TAT 6-24 HRS) Nasopharyngeal Nasopharyngeal Swab     Status: None   Collection Time: 10/09/19 11:41 PM   Specimen: Nasopharyngeal Swab  Result Value Ref Range Status   SARS Coronavirus 2 NEGATIVE NEGATIVE Final    Comment: (NOTE) SARS-CoV-2 target nucleic acids are NOT DETECTED. The SARS-CoV-2 RNA is generally detectable in upper and lower respiratory specimens during the acute phase of infection. Negative results do not preclude SARS-CoV-2 infection, do not rule out co-infections with other pathogens, and should not be used as the sole basis for treatment or other patient management decisions. Negative results must be combined with clinical observations, patient history, and epidemiological information. The expected result is Negative. Fact Sheet for Patients: SugarRoll.be Fact Sheet for Healthcare Providers: https://www.woods-mathews.com/ This test is not yet approved or cleared by the [place] FDA and  has been authorized for detection and/or diagnosis of SARS-CoV-2 by FDA under an Emergency Use Authorization (EUA). This EUA will remain  in effect (meaning this test can be used) for the duration of the COVID-19 declaration under Section 56 4(b)(1) of the Act, 21 U.S.C. section 360bbb-3(b)(1), unless the authorization is terminated or revoked sooner. Performed at [place], [place] [address] [place], [place] [address]   Surgical PCR screen     Status: None   Collection Time: 10/11/19  2:42 PM   Specimen: Nasal Mucosa; Nasal Swab  Result Value Ref Range Status   MRSA, PCR NEGATIVE NEGATIVE Final   Staphylococcus aureus NEGATIVE NEGATIVE Final    Comment: (NOTE) The Xpert SA Assay (FDA approved for NASAL specimens in patients 21 years of age and older), is one component of a comprehensive surveillance program. It is not intended to diagnose infection nor to guide or monitor treatment. Performed at [facility], [place] [address], [place], [place] [address]   Culture, Urine     Status: Abnormal   Collection Time: 10/12/19  4:27 PM   Specimen: Urine, Random  Result Value Ref Range Status   Specimen Description   Final    URINE, RANDOM Performed at [facility] [address], [place], [place] [address]  Special Requests   Final    NONE Performed at [facility], [place] [address], [place], [place] [address]    Culture (A)  Final    <10,000 COLONIES/mL INSIGNIFICANT GROWTH Performed at [place] [address] [place], [place] [address]    Report Status 10/13/2019 FINAL  Final  SARS CORONAVIRUS 2 (TAT 6-24 HRS) Nasopharyngeal Nasopharyngeal Swab     Status: None   Collection Time: 10/15/19  2:02 PM   Specimen: Nasopharyngeal Swab  Result Value Ref Range Status   SARS Coronavirus 2 NEGATIVE NEGATIVE Final    Comment: (NOTE) SARS-CoV-2 target nucleic acids are NOT DETECTED. The SARS-CoV-2 RNA is generally detectable in upper and lower respiratory specimens during the acute phase of infection. Negative results do not preclude SARS-CoV-2 infection, do not rule out co-infections with other pathogens, and should not be used as the sole basis for treatment or other patient management decisions. Negative results must be combined with clinical observations, patient history, and  epidemiological information. The expected result is Negative. Fact Sheet for Patients: SugarRoll.be Fact Sheet for Healthcare Providers: https://www.woods-mathews.com/ This test is not yet approved or cleared by the [place] FDA and  has been authorized for detection and/or diagnosis of SARS-CoV-2 by FDA under an Emergency Use Authorization (EUA). This EUA will remain  in effect (meaning this test can be used) for the duration of the COVID-19 declaration under Section 56 4(b)(1) of the Act, 21 U.S.C. section 360bbb-3(b)(1), unless the authorization is terminated or revoked sooner. Performed at [facility], [place] [address] [place], [place] [address]      Labs: BNP (last 3 results) No results for input(s): BNP in the last 8760 hours. Basic Metabolic Panel: Recent Labs  Lab 10/10/19 0021 10/10/19 0021 10/10/19 0957 10/11/19 0340 10/12/19 0319 10/14/19 0501 10/16/19 0435  NA 140  --  141 142  --  139 141  K 4.8  --  4.9 4.8  --  4.8 4.6  CL 112*  --  112* 113*  --  111 111  CO2 22  --  22 25  --  23 24  GLUCOSE 98  --  99 114*  --  117* 119*  BUN 45*  --  46* 46*  --  62* 63*  CREATININE 3.27*   < > 3.39* 3.47* 3.31* 3.46* 2.98*  CALCIUM 7.5*  --  7.6* 7.5*  --  7.7* 8.4*   < > = values in this interval not displayed.   Liver Function Tests: Recent Labs  Lab 10/10/19 0021  AST 24  ALT 26  ALKPHOS 53  BILITOT 0.6  PROT 4.2*  ALBUMIN 1.7*   No results for input(s): LIPASE, AMYLASE in the last 168 hours. No results for input(s): AMMONIA in the last 168 hours. CBC: Recent Labs  Lab 10/12/19 0319 10/13/19 0432 10/14/19 0501 10/15/19 0459 10/16/19 0435  WBC 4.8 4.9 5.2 4.9 4.9  NEUTROABS 3.7 2.8 3.3 2.8 2.7  HGB 9.0* 7.9* 8.5* 8.8* 8.8*  HCT 27.6* 25.2* 26.8* 28.0* 27.4*  MCV 94.5 96.6 96.1 96.9 95.5  PLT 126* 120* 122* 126* 114*   Cardiac Enzymes: No results for input(s): CKTOTAL, CKMB, CKMBINDEX,  TROPONINI in the last 168 hours. BNP: Invalid input(s): POCBNP CBG: Recent Labs  Lab 10/15/19 0711 10/15/19 1127 10/15/19 1653 10/15/19 2051 10/16/19 0738  GLUCAP 128* 158* 117* 147* 113*   D-Dimer No results for input(s): DDIMER in the last 72 hours. Hgb A1c No  results for input(s): HGBA1C in the last 72 hours. Lipid Profile No results for input(s): CHOL, HDL, LDLCALC, TRIG, CHOLHDL, LDLDIRECT in the last 72 hours. Thyroid function studies No results for input(s): TSH, T4TOTAL, T3FREE, THYROIDAB in the last 72 hours.  Invalid input(s): FREET3 Anemia work up No results for input(s): VITAMINB12, FOLATE, FERRITIN, TIBC, IRON, RETICCTPCT in the last 72 hours. Urinalysis No results found for: COLORURINE, APPEARANCEUR, [place], [place], [place], [place], [place], [place], PROTEINUR, UROBILINOGEN, NITRITE, LEUKOCYTESUR Sepsis Labs Invalid input(s): PROCALCITONIN,  WBC,  LACTICIDVEN Microbiology Recent Results (from the past 240 hour(s))  SARS CORONAVIRUS 2 (TAT 6-24 HRS) Nasopharyngeal Nasopharyngeal Swab     Status: None   Collection Time: 10/09/19 11:41 PM   Specimen: Nasopharyngeal Swab  Result Value Ref Range Status   SARS Coronavirus 2 NEGATIVE NEGATIVE Final    Comment: (NOTE) SARS-CoV-2 target nucleic acids are NOT DETECTED. The SARS-CoV-2 RNA is generally detectable in upper and lower respiratory specimens during the acute phase of infection. Negative results do not preclude SARS-CoV-2 infection, do not rule out co-infections with other pathogens, and should not be used as the sole basis for treatment or other patient management decisions. Negative results must be combined with clinical observations, patient history, and epidemiological information. The expected result is Negative. Fact Sheet for Patients: SugarRoll.be Fact Sheet for Healthcare Providers: https://www.woods-mathews.com/ This test is not yet approved or  cleared by the [place] FDA and  has been authorized for detection and/or diagnosis of SARS-CoV-2 by FDA under an Emergency Use Authorization (EUA). This EUA will remain  in effect (meaning this test can be used) for the duration of the COVID-19 declaration under Section 56 4(b)(1) of the Act, 21 U.S.C. section 360bbb-3(b)(1), unless the authorization is terminated or revoked sooner. Performed at [facility], [place] [address], [place], [place] [address]   Surgical PCR screen     Status: None   Collection Time: 10/11/19  2:42 PM   Specimen: Nasal Mucosa; Nasal Swab  Result Value Ref Range Status   MRSA, PCR NEGATIVE NEGATIVE Final   Staphylococcus aureus NEGATIVE NEGATIVE Final    Comment: (NOTE) The Xpert SA Assay (FDA approved for NASAL specimens in patients 24 years of age and older), is one component of a comprehensive surveillance program. It is not intended to diagnose infection nor to guide or monitor treatment. Performed at [facility], [place] [address], [place], [place] [address]   Culture, Urine     Status: Abnormal   Collection Time: 10/12/19  4:27 PM   Specimen: Urine, Random  Result Value Ref Range Status   Specimen Description   Final    URINE, RANDOM Performed at [facility] [address], [place], [place] [address]    Special Requests   Final    NONE Performed at [facility], [place] [address], [place], [place] [address]    Culture (A)  Final    <10,000 COLONIES/mL INSIGNIFICANT GROWTH Performed at [place] [address], [place],  [address]    Report Status 10/13/2019 FINAL  Final  SARS CORONAVIRUS 2 (TAT 6-24 HRS) Nasopharyngeal Nasopharyngeal Swab     Status: None   Collection Time: 10/15/19  2:02 PM   Specimen: Nasopharyngeal Swab  Result Value Ref Range Status   SARS Coronavirus 2 NEGATIVE NEGATIVE Final    Comment: (NOTE) SARS-CoV-2 target nucleic acids are NOT  DETECTED. The SARS-CoV-2 RNA is generally detectable in upper and lower respiratory specimens  during the acute phase of infection. Negative results do not preclude SARS-CoV-2 infection, do not rule out co-infections with other pathogens, and should not be used as the sole basis for treatment or other patient management decisions. Negative results must be combined with clinical observations, patient history, and epidemiological information. The expected result is Negative. Fact Sheet for Patients: SugarRoll.be Fact Sheet for Healthcare Providers: https://www.woods-mathews.com/ This test is not yet approved or cleared by the [place] FDA and  has been authorized for detection and/or diagnosis of SARS-CoV-2 by FDA under an Emergency Use Authorization (EUA). This EUA will remain  in effect (meaning this test can be used) for the duration of the COVID-19 declaration under Section 56 4(b)(1) of the Act, 21 U.S.C. section 360bbb-3(b)(1), unless the authorization is terminated or revoked sooner. Performed at [facility], [place] [address], [place], [place] [address]     Please note: You were cared for by a hospitalist during your hospital stay. Once you are discharged, your primary care physician will handle any further medical issues. Please note that NO REFILLS for any discharge medications will be authorized once you are discharged, as it is imperative that you return to your primary care physician (or establish a relationship with a primary care physician if you do not have one) for your post hospital discharge needs so that they can reassess your need for medications and monitor your lab values.    Time coordinating discharge: 40 minutes  SIGNED:   Shelly Coss, MD  [facility] 10/16/2019, 10:36 AM Pager [phone]  If 7PM-7AM, please contact night-coverage [email or web address] Password [email or web address]

## 2019-10-15 NOTE — TOC Progression Note (Signed)
Transition of Care [facility]) - Progression Note    Patient Details  Name: Darrell [name] MRN: [id number] Date of Birth: Dec 31, 1940  Transition of Care [facility] CM/SW Contact  Joaquin Courts, RN Phone Number: 10/15/2019, 5:21 PM  Clinical Narrative:    Patient reports he can supply the required medication, Odefsey, to facility. Per patient request CM spoke with emergency contact Truman Hayward who states she will pick up the medication from patient's apartment and can bring it to the facility tomorrow afternoon.    Expected Discharge Plan: [employer] Barriers to Discharge: No SNF bed  Expected Discharge Plan and Services Expected Discharge Plan: [place]   Discharge Planning Services: CM Consult Post Acute Care Choice: [place] Living arrangements for the past 2 months: Single Family Home Expected Discharge Date: 10/15/19               DME Arranged: N/A DME Agency: NA       HH Arranged: NA HH Agency: NA         Social Determinants of Health (SDOH) Interventions    Readmission Risk Interventions No flowsheet data found.

## 2019-10-15 NOTE — Progress Notes (Signed)
Patient ID: Darrell [name], male   DOB: [birth year], 79 y.o.   MRN: [id number]  4 Days Post-Op Subjective: Pt did well over weekend.  He required catheterization once and passed a few old clots but no new bleeding noted.  He states he voided well subjectively over the past 24 hrs.  No urine is collected to examine this morning.  Objective: Vital signs in last 24 hours: Temp:  [98.1 F (36.7 C)-98.6 F (37 C)] 98.6 F (37 C) (01/18 0454) Pulse Rate:  [77-82] 82 (01/18 0454) Resp:  [16-20] 18 (01/18 0454) BP: (129-139)/(70-76) 139/76 (01/18 0454) SpO2:  [100 %] 100 % (01/18 0454)  Intake/Output from previous day: 01/17 0701 - 01/18 0700 In: 130 [P.O.:120; I.V.:10] Out: 550 [Urine:550] Intake/Output this shift: No intake/output data recorded.  Physical Exam:  General: Alert and oriented   Lab Results: Recent Labs    10/13/19 0432 10/14/19 0501 10/15/19 0459  HGB 7.9* 8.5* 8.8*  HCT 25.2* 26.8* 28.0*   BMET Recent Labs    10/14/19 0501  NA 139  K 4.8  CL 111  CO2 23  GLUCOSE 117*  BUN 62*  CREATININE 3.46*  CALCIUM 7.7*     Studies/Results: No results found.  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  No evidence to suggest ongoing bleeding.  He is stable for discharge from a urologic perspective.  The [facility] has received a referral and will be working to schedule him to begin hyperbaric oxygen therapy to prevent future episodes of radiation cystitis.  Please notify the [facility] upon the patient's discharge so they are aware where he will be to contact him once insurance approval for treatment is obtained.    2) Prostate cancer: His PSA was checked over the weekend and is undetectable.  3) Follow up with urology in either [place] (Dr. Comer Locket) or [facility] (me) in 2-3 months or sooner if needed.   LOS: 6 days   Darrell [name] 10/15/2019, 7:01 AM

## 2019-10-15 NOTE — TOC Progression Note (Signed)
Transition of Care [facility]) - Progression Note    Patient Details  Name: Darrell [name] MRN: [id number] Date of Birth: 06-18-1941  Transition of Care [facility]) CM/SW Contact  Joaquin Courts, RN Phone Number: 10/15/2019, 4:45 PM  Clinical Narrative:    CM presented bed offers to patient. Patient [facility] for SNF.  Cm spoke with facility rep who states bed available tomorrow 10/16/19 however patient would need to provide his own supply of [place] for admission to be possible.  CM is awaiting confirmation from patient that his home supply of this medication can be provided to facility. Patient's Passrr [phone].    Expected Discharge Plan: [place] Barriers to Discharge: No SNF bed  Expected Discharge Plan and Services Expected Discharge Plan: [place]   Discharge Planning Services: CM Consult Post Acute Care Choice: [place] Living arrangements for the past 2 months: Single Family Home Expected Discharge Date: 10/15/19               DME Arranged: N/A DME Agency: NA       HH Arranged: NA HH Agency: NA         Social Determinants of Health (SDOH) Interventions    Readmission Risk Interventions No flowsheet data found.

## 2019-10-16 DIAGNOSIS — R310 Gross hematuria: Secondary | ICD-10-CM | POA: Diagnosis not present

## 2019-10-16 DIAGNOSIS — Z8546 Personal history of malignant neoplasm of prostate: Secondary | ICD-10-CM | POA: Diagnosis not present

## 2019-10-16 DIAGNOSIS — Z79890 Hormone replacement therapy (postmenopausal): Secondary | ICD-10-CM | POA: Diagnosis not present

## 2019-10-16 DIAGNOSIS — E1122 Type 2 diabetes mellitus with diabetic chronic kidney disease: Secondary | ICD-10-CM | POA: Diagnosis not present

## 2019-10-16 DIAGNOSIS — Y842 Radiological procedure and radiotherapy as the cause of abnormal reaction of the patient, or of later complication, without mention of misadventure at the time of the procedure: Secondary | ICD-10-CM | POA: Diagnosis present

## 2019-10-16 DIAGNOSIS — N3040 Irradiation cystitis without hematuria: Secondary | ICD-10-CM | POA: Diagnosis not present

## 2019-10-16 DIAGNOSIS — Z794 Long term (current) use of insulin: Secondary | ICD-10-CM | POA: Diagnosis not present

## 2019-10-16 DIAGNOSIS — Z6833 Body mass index (BMI) 33.0-33.9, adult: Secondary | ICD-10-CM | POA: Diagnosis not present

## 2019-10-16 DIAGNOSIS — N184 Chronic kidney disease, stage 4 (severe): Secondary | ICD-10-CM | POA: Diagnosis not present

## 2019-10-16 DIAGNOSIS — D500 Iron deficiency anemia secondary to blood loss (chronic): Secondary | ICD-10-CM | POA: Diagnosis not present

## 2019-10-16 DIAGNOSIS — Z66 Do not resuscitate: Secondary | ICD-10-CM | POA: Diagnosis not present

## 2019-10-16 DIAGNOSIS — F329 Major depressive disorder, single episode, unspecified: Secondary | ICD-10-CM | POA: Diagnosis present

## 2019-10-16 DIAGNOSIS — Z885 Allergy status to narcotic agent status: Secondary | ICD-10-CM | POA: Diagnosis not present

## 2019-10-16 DIAGNOSIS — M6281 Muscle weakness (generalized): Secondary | ICD-10-CM | POA: Diagnosis not present

## 2019-10-16 DIAGNOSIS — Z03818 Encounter for observation for suspected exposure to other biological agents ruled out: Secondary | ICD-10-CM | POA: Diagnosis not present

## 2019-10-16 DIAGNOSIS — Z96653 Presence of artificial knee joint, bilateral: Secondary | ICD-10-CM | POA: Diagnosis not present

## 2019-10-16 DIAGNOSIS — I152 Hypertension secondary to endocrine disorders: Secondary | ICD-10-CM | POA: Diagnosis not present

## 2019-10-16 DIAGNOSIS — R2681 Unsteadiness on feet: Secondary | ICD-10-CM | POA: Diagnosis not present

## 2019-10-16 DIAGNOSIS — D631 Anemia in chronic kidney disease: Secondary | ICD-10-CM | POA: Diagnosis not present

## 2019-10-16 DIAGNOSIS — Z79899 Other long term (current) drug therapy: Secondary | ICD-10-CM | POA: Diagnosis not present

## 2019-10-16 DIAGNOSIS — E7849 Other hyperlipidemia: Secondary | ICD-10-CM | POA: Diagnosis not present

## 2019-10-16 DIAGNOSIS — D649 Anemia, unspecified: Secondary | ICD-10-CM | POA: Diagnosis not present

## 2019-10-16 DIAGNOSIS — I10 Essential (primary) hypertension: Secondary | ICD-10-CM | POA: Diagnosis not present

## 2019-10-16 DIAGNOSIS — N185 Chronic kidney disease, stage 5: Secondary | ICD-10-CM | POA: Diagnosis not present

## 2019-10-16 DIAGNOSIS — Z923 Personal history of irradiation: Secondary | ICD-10-CM | POA: Diagnosis not present

## 2019-10-16 DIAGNOSIS — F209 Schizophrenia, unspecified: Secondary | ICD-10-CM | POA: Diagnosis present

## 2019-10-16 DIAGNOSIS — N3041 Irradiation cystitis with hematuria: Secondary | ICD-10-CM | POA: Diagnosis not present

## 2019-10-16 DIAGNOSIS — R262 Difficulty in walking, not elsewhere classified: Secondary | ICD-10-CM | POA: Diagnosis not present

## 2019-10-16 DIAGNOSIS — R319 Hematuria, unspecified: Secondary | ICD-10-CM | POA: Diagnosis not present

## 2019-10-16 DIAGNOSIS — E440 Moderate protein-calorie malnutrition: Secondary | ICD-10-CM | POA: Diagnosis not present

## 2019-10-16 DIAGNOSIS — R531 Weakness: Secondary | ICD-10-CM | POA: Diagnosis not present

## 2019-10-16 DIAGNOSIS — F418 Other specified anxiety disorders: Secondary | ICD-10-CM | POA: Diagnosis present

## 2019-10-16 DIAGNOSIS — F0390 Unspecified dementia without behavioral disturbance: Secondary | ICD-10-CM | POA: Diagnosis present

## 2019-10-16 DIAGNOSIS — E119 Type 2 diabetes mellitus without complications: Secondary | ICD-10-CM | POA: Diagnosis not present

## 2019-10-16 DIAGNOSIS — Z743 Need for continuous supervision: Secondary | ICD-10-CM | POA: Diagnosis not present

## 2019-10-16 DIAGNOSIS — I129 Hypertensive chronic kidney disease with stage 1 through stage 4 chronic kidney disease, or unspecified chronic kidney disease: Secondary | ICD-10-CM | POA: Diagnosis not present

## 2019-10-16 DIAGNOSIS — E1159 Type 2 diabetes mellitus with other circulatory complications: Secondary | ICD-10-CM | POA: Diagnosis not present

## 2019-10-16 DIAGNOSIS — E785 Hyperlipidemia, unspecified: Secondary | ICD-10-CM | POA: Diagnosis not present

## 2019-10-16 DIAGNOSIS — B20 Human immunodeficiency virus [HIV] disease: Secondary | ICD-10-CM | POA: Diagnosis present

## 2019-10-16 DIAGNOSIS — R339 Retention of urine, unspecified: Secondary | ICD-10-CM | POA: Diagnosis not present

## 2019-10-16 DIAGNOSIS — R58 Hemorrhage, not elsewhere classified: Secondary | ICD-10-CM | POA: Diagnosis not present

## 2019-10-16 DIAGNOSIS — Z7401 Bed confinement status: Secondary | ICD-10-CM | POA: Diagnosis not present

## 2019-10-16 DIAGNOSIS — Z20822 Contact with and (suspected) exposure to covid-19: Secondary | ICD-10-CM | POA: Diagnosis not present

## 2019-10-16 DIAGNOSIS — M2550 Pain in unspecified joint: Secondary | ICD-10-CM | POA: Diagnosis not present

## 2019-10-16 DIAGNOSIS — N029 Recurrent and persistent hematuria with unspecified morphologic changes: Secondary | ICD-10-CM | POA: Diagnosis not present

## 2019-10-16 LAB — BASIC METABOLIC PANEL
Anion gap: 6 (ref 5–15)
BUN: 63 mg/dL — ABNORMAL HIGH (ref 8–23)
CO2: 24 mmol/L (ref 22–32)
Calcium: 8.4 mg/dL — ABNORMAL LOW (ref 8.9–10.3)
Chloride: 111 mmol/L (ref 98–111)
Creatinine, Ser: 2.98 mg/dL — ABNORMAL HIGH (ref 0.61–1.24)
GFR calc Af Amer: 23 mL/min — ABNORMAL LOW (ref >60)
GFR calc non Af Amer: 19 mL/min — ABNORMAL LOW (ref >60)
Glucose, Bld: 119 mg/dL — ABNORMAL HIGH (ref 70–99)
Potassium: 4.6 mmol/L (ref 3.5–5.1)
Sodium: 141 mmol/L (ref 135–145)

## 2019-10-16 LAB — CBC WITH DIFFERENTIAL/PLATELET
Abs Immature Granulocytes: 0.03 10*3/uL (ref 0.00–0.07)
Basophils Absolute: 0 10*3/uL (ref 0.0–0.1)
Basophils Relative: 0 %
Eosinophils Absolute: 0.4 10*3/uL (ref 0.0–0.5)
Eosinophils Relative: 8 %
HCT: 27.4 % — ABNORMAL LOW (ref 39.0–52.0)
Hemoglobin: 8.8 g/dL — ABNORMAL LOW (ref 13.0–17.0)
Immature Granulocytes: 1 %
Lymphocytes Relative: 25 %
Lymphs Abs: 1.2 10*3/uL (ref 0.7–4.0)
MCH: 30.7 pg (ref 26.0–34.0)
MCHC: 32.1 g/dL (ref 30.0–36.0)
MCV: 95.5 fL (ref 80.0–100.0)
Monocytes Absolute: 0.5 10*3/uL (ref 0.1–1.0)
Monocytes Relative: 10 %
Neutro Abs: 2.7 10*3/uL (ref 1.7–7.7)
Neutrophils Relative %: 56 %
Platelets: 114 10*3/uL — ABNORMAL LOW (ref 150–400)
RBC: 2.87 MIL/uL — ABNORMAL LOW (ref 4.22–5.81)
RDW: 14.6 % (ref 11.5–15.5)
WBC: 4.9 10*3/uL (ref 4.0–10.5)
nRBC: 0 % (ref 0.0–0.2)

## 2019-10-16 LAB — GLUCOSE, CAPILLARY
Glucose-Capillary: 113 mg/dL — ABNORMAL HIGH (ref 70–99)
Glucose-Capillary: 165 mg/dL — ABNORMAL HIGH (ref 70–99)

## 2019-10-16 NOTE — Progress Notes (Signed)
Patient ID: Darrell [name], male   DOB: 10-Jul-1941, 79 y.o.   MRN: [id number]  Hgb stable today.  Pt to be discharged to SNF.  Please notify [facility] where patient will be going so they care appropriately arrange hyperbaric oxygen treatment and work to arrange transportation with that facility.

## 2019-10-16 NOTE — Progress Notes (Addendum)
Patient seen and examined at the bedside this morning.  Hemodynamically stable.  Appears weak but comfortable.  No new changes since yesterday.  DC summary and orders are on place.

## 2019-10-16 NOTE — Care Management Important Message (Signed)
Important Message  Patient Details IM Letter given to Dessa Phi RN Case Manager to present to the Patient Name: Darrell [name] MRN: [id number] Date of Birth: 1940-12-06   Medicare Important Message Given:  Yes     Kerin Salen 10/16/2019, 2:07 PM

## 2019-10-16 NOTE — Progress Notes (Signed)
Patient being discharged to [facility], report given to [facility], patient transported via [facility]

## 2019-10-17 DIAGNOSIS — E785 Hyperlipidemia, unspecified: Secondary | ICD-10-CM | POA: Diagnosis not present

## 2019-10-17 DIAGNOSIS — D649 Anemia, unspecified: Secondary | ICD-10-CM | POA: Diagnosis not present

## 2019-10-17 DIAGNOSIS — E1159 Type 2 diabetes mellitus with other circulatory complications: Secondary | ICD-10-CM | POA: Diagnosis not present

## 2019-10-17 DIAGNOSIS — I152 Hypertension secondary to endocrine disorders: Secondary | ICD-10-CM | POA: Diagnosis not present

## 2019-10-22 ENCOUNTER — Other Ambulatory Visit: Payer: Self-pay

## 2019-10-22 ENCOUNTER — Encounter
Admission: EM | Disposition: A | Discharge status: Skilled Nursing Facility | Payer: Self-pay | Source: Skilled Nursing Facility | Attending: Family Medicine

## 2019-10-22 ENCOUNTER — Inpatient Hospital Stay: Payer: Medicare Other | Admitting: Certified Registered Nurse Anesthetist

## 2019-10-22 ENCOUNTER — Inpatient Hospital Stay
Admission: EM | Admit: 2019-10-22 | Discharge: 2019-10-25 | DRG: 663 | Disposition: A | Discharge status: Skilled Nursing Facility | Payer: Medicare Other | Source: Skilled Nursing Facility | Attending: Family Medicine | Admitting: Family Medicine

## 2019-10-22 ENCOUNTER — Inpatient Hospital Stay: Payer: Medicare Other

## 2019-10-22 ENCOUNTER — Encounter: Payer: Self-pay | Admitting: Emergency Medicine

## 2019-10-22 DIAGNOSIS — R58 Hemorrhage, not elsewhere classified: Secondary | ICD-10-CM | POA: Diagnosis not present

## 2019-10-22 DIAGNOSIS — R339 Retention of urine, unspecified: Secondary | ICD-10-CM | POA: Diagnosis not present

## 2019-10-22 DIAGNOSIS — Z923 Personal history of irradiation: Secondary | ICD-10-CM | POA: Diagnosis not present

## 2019-10-22 DIAGNOSIS — Z794 Long term (current) use of insulin: Secondary | ICD-10-CM | POA: Diagnosis not present

## 2019-10-22 DIAGNOSIS — Z66 Do not resuscitate: Secondary | ICD-10-CM | POA: Diagnosis present

## 2019-10-22 DIAGNOSIS — D649 Anemia, unspecified: Secondary | ICD-10-CM | POA: Diagnosis present

## 2019-10-22 DIAGNOSIS — N3040 Irradiation cystitis without hematuria: Secondary | ICD-10-CM

## 2019-10-22 DIAGNOSIS — E669 Obesity, unspecified: Secondary | ICD-10-CM | POA: Diagnosis present

## 2019-10-22 DIAGNOSIS — Z79899 Other long term (current) drug therapy: Secondary | ICD-10-CM | POA: Diagnosis not present

## 2019-10-22 DIAGNOSIS — I951 Orthostatic hypotension: Secondary | ICD-10-CM | POA: Diagnosis not present

## 2019-10-22 DIAGNOSIS — Z8546 Personal history of malignant neoplasm of prostate: Secondary | ICD-10-CM | POA: Diagnosis not present

## 2019-10-22 DIAGNOSIS — Z20822 Contact with and (suspected) exposure to covid-19: Secondary | ICD-10-CM | POA: Diagnosis present

## 2019-10-22 DIAGNOSIS — D631 Anemia in chronic kidney disease: Secondary | ICD-10-CM | POA: Diagnosis present

## 2019-10-22 DIAGNOSIS — E440 Moderate protein-calorie malnutrition: Secondary | ICD-10-CM | POA: Diagnosis not present

## 2019-10-22 DIAGNOSIS — Y842 Radiological procedure and radiotherapy as the cause of abnormal reaction of the patient, or of later complication, without mention of misadventure at the time of the procedure: Secondary | ICD-10-CM | POA: Diagnosis present

## 2019-10-22 DIAGNOSIS — N185 Chronic kidney disease, stage 5: Secondary | ICD-10-CM | POA: Diagnosis not present

## 2019-10-22 DIAGNOSIS — N029 Recurrent and persistent hematuria with unspecified morphologic changes: Secondary | ICD-10-CM | POA: Diagnosis not present

## 2019-10-22 DIAGNOSIS — Z96653 Presence of artificial knee joint, bilateral: Secondary | ICD-10-CM | POA: Diagnosis not present

## 2019-10-22 DIAGNOSIS — M6281 Muscle weakness (generalized): Secondary | ICD-10-CM | POA: Diagnosis not present

## 2019-10-22 DIAGNOSIS — F329 Major depressive disorder, single episode, unspecified: Secondary | ICD-10-CM | POA: Diagnosis present

## 2019-10-22 DIAGNOSIS — Z79890 Hormone replacement therapy (postmenopausal): Secondary | ICD-10-CM | POA: Diagnosis not present

## 2019-10-22 DIAGNOSIS — N184 Chronic kidney disease, stage 4 (severe): Secondary | ICD-10-CM | POA: Diagnosis present

## 2019-10-22 DIAGNOSIS — F0390 Unspecified dementia without behavioral disturbance: Secondary | ICD-10-CM | POA: Diagnosis present

## 2019-10-22 DIAGNOSIS — R2689 Other abnormalities of gait and mobility: Secondary | ICD-10-CM | POA: Diagnosis not present

## 2019-10-22 DIAGNOSIS — R319 Hematuria, unspecified: Secondary | ICD-10-CM | POA: Diagnosis present

## 2019-10-22 DIAGNOSIS — F418 Other specified anxiety disorders: Secondary | ICD-10-CM | POA: Diagnosis present

## 2019-10-22 DIAGNOSIS — Z03818 Encounter for observation for suspected exposure to other biological agents ruled out: Secondary | ICD-10-CM | POA: Diagnosis not present

## 2019-10-22 DIAGNOSIS — E785 Hyperlipidemia, unspecified: Secondary | ICD-10-CM | POA: Diagnosis present

## 2019-10-22 DIAGNOSIS — F209 Schizophrenia, unspecified: Secondary | ICD-10-CM | POA: Diagnosis present

## 2019-10-22 DIAGNOSIS — Z885 Allergy status to narcotic agent status: Secondary | ICD-10-CM | POA: Diagnosis not present

## 2019-10-22 DIAGNOSIS — B20 Human immunodeficiency virus [HIV] disease: Secondary | ICD-10-CM | POA: Diagnosis present

## 2019-10-22 DIAGNOSIS — N3281 Overactive bladder: Secondary | ICD-10-CM | POA: Diagnosis not present

## 2019-10-22 DIAGNOSIS — Z6833 Body mass index (BMI) 33.0-33.9, adult: Secondary | ICD-10-CM | POA: Diagnosis not present

## 2019-10-22 DIAGNOSIS — Z743 Need for continuous supervision: Secondary | ICD-10-CM | POA: Diagnosis not present

## 2019-10-22 DIAGNOSIS — R278 Other lack of coordination: Secondary | ICD-10-CM | POA: Diagnosis not present

## 2019-10-22 DIAGNOSIS — R531 Weakness: Secondary | ICD-10-CM | POA: Diagnosis not present

## 2019-10-22 DIAGNOSIS — E1169 Type 2 diabetes mellitus with other specified complication: Secondary | ICD-10-CM | POA: Diagnosis not present

## 2019-10-22 DIAGNOSIS — N3041 Irradiation cystitis with hematuria: Secondary | ICD-10-CM | POA: Diagnosis not present

## 2019-10-22 DIAGNOSIS — I129 Hypertensive chronic kidney disease with stage 1 through stage 4 chronic kidney disease, or unspecified chronic kidney disease: Secondary | ICD-10-CM | POA: Diagnosis not present

## 2019-10-22 DIAGNOSIS — R310 Gross hematuria: Secondary | ICD-10-CM

## 2019-10-22 DIAGNOSIS — D500 Iron deficiency anemia secondary to blood loss (chronic): Secondary | ICD-10-CM | POA: Diagnosis not present

## 2019-10-22 DIAGNOSIS — R1310 Dysphagia, unspecified: Secondary | ICD-10-CM | POA: Diagnosis not present

## 2019-10-22 DIAGNOSIS — R402411 Glasgow coma scale score 13-15, in the field [EMT or ambulance]: Secondary | ICD-10-CM | POA: Diagnosis not present

## 2019-10-22 DIAGNOSIS — E1122 Type 2 diabetes mellitus with diabetic chronic kidney disease: Secondary | ICD-10-CM | POA: Diagnosis not present

## 2019-10-22 DIAGNOSIS — I10 Essential (primary) hypertension: Secondary | ICD-10-CM | POA: Diagnosis not present

## 2019-10-22 DIAGNOSIS — E119 Type 2 diabetes mellitus without complications: Secondary | ICD-10-CM | POA: Diagnosis not present

## 2019-10-22 DIAGNOSIS — Z7401 Bed confinement status: Secondary | ICD-10-CM | POA: Diagnosis not present

## 2019-10-22 DIAGNOSIS — M2550 Pain in unspecified joint: Secondary | ICD-10-CM | POA: Diagnosis not present

## 2019-10-22 DIAGNOSIS — I5032 Chronic diastolic (congestive) heart failure: Secondary | ICD-10-CM | POA: Diagnosis not present

## 2019-10-22 DIAGNOSIS — R55 Syncope and collapse: Secondary | ICD-10-CM | POA: Diagnosis not present

## 2019-10-22 HISTORY — PX: CYSTOSCOPY WITH FULGERATION: SHX6638

## 2019-10-22 LAB — CBC WITH DIFFERENTIAL/PLATELET
Abs Immature Granulocytes: 0.03 10*3/uL (ref 0.00–0.07)
Basophils Absolute: 0 10*3/uL (ref 0.0–0.1)
Basophils Relative: 0 %
Eosinophils Absolute: 0.3 10*3/uL (ref 0.0–0.5)
Eosinophils Relative: 3 %
HCT: 28.7 % — ABNORMAL LOW (ref 39.0–52.0)
Hemoglobin: 9.4 g/dL — ABNORMAL LOW (ref 13.0–17.0)
Immature Granulocytes: 0 %
Lymphocytes Relative: 22 %
Lymphs Abs: 1.8 10*3/uL (ref 0.7–4.0)
MCH: 30.8 pg (ref 26.0–34.0)
MCHC: 32.8 g/dL (ref 30.0–36.0)
MCV: 94.1 fL (ref 80.0–100.0)
Monocytes Absolute: 0.4 10*3/uL (ref 0.1–1.0)
Monocytes Relative: 5 %
Neutro Abs: 5.5 10*3/uL (ref 1.7–7.7)
Neutrophils Relative %: 70 %
Platelets: 118 10*3/uL — ABNORMAL LOW (ref 150–400)
RBC: 3.05 MIL/uL — ABNORMAL LOW (ref 4.22–5.81)
RDW: 15.5 % (ref 11.5–15.5)
WBC: 7.9 10*3/uL (ref 4.0–10.5)
nRBC: 0 % (ref 0.0–0.2)

## 2019-10-22 LAB — URINALYSIS, COMPLETE (UACMP) WITH MICROSCOPIC
Bacteria, UA: NONE SEEN
RBC / HPF: >50 RBC/hpf — ABNORMAL HIGH (ref 0–5)
Specific Gravity, Urine: 1.005 (ref 1.005–1.030)
Squamous Epithelial / HPF: NONE SEEN (ref 0–5)
WBC, UA: >50 WBC/hpf — ABNORMAL HIGH (ref 0–5)

## 2019-10-22 LAB — CBC
HCT: 28 % — ABNORMAL LOW (ref 39.0–52.0)
Hemoglobin: 9.3 g/dL — ABNORMAL LOW (ref 13.0–17.0)
MCH: 30.8 pg (ref 26.0–34.0)
MCHC: 33.2 g/dL (ref 30.0–36.0)
MCV: 92.7 fL (ref 80.0–100.0)
Platelets: 124 10*3/uL — ABNORMAL LOW (ref 150–400)
RBC: 3.02 MIL/uL — ABNORMAL LOW (ref 4.22–5.81)
RDW: 15.3 % (ref 11.5–15.5)
WBC: 9.6 10*3/uL (ref 4.0–10.5)
nRBC: 0 % (ref 0.0–0.2)

## 2019-10-22 LAB — BASIC METABOLIC PANEL
Anion gap: 10 (ref 5–15)
BUN: 50 mg/dL — ABNORMAL HIGH (ref 8–23)
CO2: 19 mmol/L — ABNORMAL LOW (ref 22–32)
Calcium: 7.9 mg/dL — ABNORMAL LOW (ref 8.9–10.3)
Chloride: 112 mmol/L — ABNORMAL HIGH (ref 98–111)
Creatinine, Ser: 3.43 mg/dL — ABNORMAL HIGH (ref 0.61–1.24)
GFR calc Af Amer: 19 mL/min — ABNORMAL LOW (ref >60)
GFR calc non Af Amer: 16 mL/min — ABNORMAL LOW (ref >60)
Glucose, Bld: 145 mg/dL — ABNORMAL HIGH (ref 70–99)
Potassium: 4.5 mmol/L (ref 3.5–5.1)
Sodium: 141 mmol/L (ref 135–145)

## 2019-10-22 LAB — RESPIRATORY PANEL BY RT PCR (FLU A&B, COVID)
Influenza A by PCR: NEGATIVE
Influenza B by PCR: NEGATIVE
SARS Coronavirus 2 by RT PCR: NEGATIVE

## 2019-10-22 LAB — GLUCOSE, CAPILLARY
Glucose-Capillary: 127 mg/dL — ABNORMAL HIGH (ref 70–99)
Glucose-Capillary: 127 mg/dL — ABNORMAL HIGH (ref 70–99)

## 2019-10-22 LAB — HEMOGLOBIN A1C
Hgb A1c MFr Bld: 5.1 % (ref 4.8–5.6)
Mean Plasma Glucose: 99.67 mg/dL

## 2019-10-22 SURGERY — CYSTOSCOPY, WITH BLADDER FULGURATION
Anesthesia: General

## 2019-10-22 MED ORDER — SODIUM CHLORIDE 0.9% FLUSH
3.0000 mL | Freq: Two times a day (BID) | INTRAVENOUS | Status: DC
  Administered 2019-10-22 – 2019-10-24 (×5): 3 mL via INTRAVENOUS

## 2019-10-22 MED ORDER — PROPOFOL 10 MG/ML IV BOLUS
INTRAVENOUS | Status: AC
  Filled 2019-10-22: qty 20

## 2019-10-22 MED ORDER — FENTANYL CITRATE (PF) 100 MCG/2ML IJ SOLN
INTRAMUSCULAR | Status: DC | PRN
  Administered 2019-10-22: 25 ug via INTRAVENOUS

## 2019-10-22 MED ORDER — DULOXETINE HCL 30 MG PO CPEP
60.0000 mg | ORAL_CAPSULE | Freq: Every day | ORAL | Status: DC
  Administered 2019-10-23 – 2019-10-25 (×3): 60 mg via ORAL
  Filled 2019-10-22 (×3): qty 2

## 2019-10-22 MED ORDER — OMEGA-3-ACID ETHYL ESTERS 1 G PO CAPS
2.0000 g | ORAL_CAPSULE | Freq: Every day | ORAL | Status: DC
  Administered 2019-10-23 – 2019-10-25 (×3): 2 g via ORAL
  Filled 2019-10-22 (×3): qty 2

## 2019-10-22 MED ORDER — MELATONIN 5 MG PO TABS
10.0000 mg | ORAL_TABLET | Freq: Every day | ORAL | Status: DC
  Administered 2019-10-23 – 2019-10-24 (×2): 10 mg via ORAL
  Filled 2019-10-22 (×4): qty 2

## 2019-10-22 MED ORDER — BUPROPION HCL ER (XL) 150 MG PO TB24
150.0000 mg | ORAL_TABLET | Freq: Every day | ORAL | Status: DC
  Administered 2019-10-23 – 2019-10-25 (×3): 150 mg via ORAL
  Filled 2019-10-22 (×3): qty 1

## 2019-10-22 MED ORDER — FESOTERODINE FUMARATE ER 8 MG PO TB24
8.0000 mg | ORAL_TABLET | Freq: Every day | ORAL | Status: DC
  Administered 2019-10-23 – 2019-10-25 (×3): 8 mg via ORAL
  Filled 2019-10-22 (×3): qty 1

## 2019-10-22 MED ORDER — CALCIUM CARBONATE ANTACID 500 MG PO CHEW
500.0000 mg | CHEWABLE_TABLET | Freq: Every day | ORAL | Status: DC
  Administered 2019-10-22 – 2019-10-25 (×4): 500 mg via ORAL
  Filled 2019-10-22 (×4): qty 3

## 2019-10-22 MED ORDER — ADULT MULTIVITAMIN W/MINERALS CH
1.0000 | ORAL_TABLET | Freq: Every day | ORAL | Status: DC
  Administered 2019-10-23 – 2019-10-25 (×3): 1 via ORAL
  Filled 2019-10-22 (×3): qty 1

## 2019-10-22 MED ORDER — ONDANSETRON HCL 4 MG/2ML IJ SOLN: 4.0000 mg | Freq: Once | INTRAMUSCULAR | Status: DC | PRN

## 2019-10-22 MED ORDER — LIDOCAINE HCL (PF) 2 % IJ SOLN
INTRAMUSCULAR | Status: AC
  Filled 2019-10-22: qty 5

## 2019-10-22 MED ORDER — PROPOFOL 10 MG/ML IV BOLUS
INTRAVENOUS | Status: DC | PRN
  Administered 2019-10-22: 120 mg via INTRAVENOUS

## 2019-10-22 MED ORDER — ONDANSETRON HCL 4 MG/2ML IJ SOLN
4.0000 mg | Freq: Once | INTRAMUSCULAR | Status: AC
  Administered 2019-10-22: 10:00:00 4 mg via INTRAVENOUS
  Filled 2019-10-22: qty 2

## 2019-10-22 MED ORDER — FENTANYL CITRATE (PF) 100 MCG/2ML IJ SOLN
INTRAMUSCULAR | Status: AC
  Filled 2019-10-22: qty 2

## 2019-10-22 MED ORDER — INSULIN ASPART 100 UNIT/ML ~~LOC~~ SOLN: 0.0000 [IU] | Freq: Every day | SUBCUTANEOUS | Status: DC

## 2019-10-22 MED ORDER — TAMSULOSIN HCL 0.4 MG PO CAPS
0.4000 mg | ORAL_CAPSULE | Freq: Every day | ORAL | Status: DC
  Administered 2019-10-23 – 2019-10-25 (×3): 0.4 mg via ORAL
  Filled 2019-10-22 (×3): qty 1

## 2019-10-22 MED ORDER — MIRABEGRON ER 25 MG PO TB24
25.0000 mg | ORAL_TABLET | Freq: Every day | ORAL | Status: DC
  Administered 2019-10-23 – 2019-10-25 (×3): 25 mg via ORAL
  Filled 2019-10-22 (×3): qty 1

## 2019-10-22 MED ORDER — DUTASTERIDE 0.5 MG PO CAPS
0.5000 mg | ORAL_CAPSULE | Freq: Every day | ORAL | Status: DC
  Administered 2019-10-23 – 2019-10-25 (×3): 0.5 mg via ORAL
  Filled 2019-10-22 (×3): qty 1

## 2019-10-22 MED ORDER — RISPERIDONE 1 MG PO TABS
1.0000 mg | ORAL_TABLET | Freq: Every day | ORAL | Status: DC
  Administered 2019-10-23 – 2019-10-24 (×2): 1 mg via ORAL
  Filled 2019-10-22 (×4): qty 1

## 2019-10-22 MED ORDER — ONDANSETRON HCL 4 MG/2ML IJ SOLN: 4.0000 mg | Freq: Four times a day (QID) | INTRAMUSCULAR | Status: DC | PRN

## 2019-10-22 MED ORDER — ATORVASTATIN CALCIUM 20 MG PO TABS
20.0000 mg | ORAL_TABLET | Freq: Every day | ORAL | Status: DC
  Administered 2019-10-23 – 2019-10-25 (×3): 20 mg via ORAL
  Filled 2019-10-22 (×3): qty 1

## 2019-10-22 MED ORDER — KETOTIFEN FUMARATE 0.025 % OP SOLN
1.0000 [drp] | Freq: Two times a day (BID) | OPHTHALMIC | Status: DC | PRN
  Filled 2019-10-22: qty 5

## 2019-10-22 MED ORDER — SUCCINYLCHOLINE CHLORIDE 20 MG/ML IJ SOLN
INTRAMUSCULAR | Status: DC | PRN
  Administered 2019-10-22: 140 mg via INTRAVENOUS

## 2019-10-22 MED ORDER — ONDANSETRON HCL 4 MG PO TABS: 4.0000 mg | ORAL_TABLET | Freq: Four times a day (QID) | ORAL | Status: DC | PRN

## 2019-10-22 MED ORDER — EMTRICITAB-RILPIVIR-TENOFOV AF 200-25-25 MG PO TABS
1.0000 | ORAL_TABLET | Freq: Every day | ORAL | Status: DC
  Administered 2019-10-24 – 2019-10-25 (×2): 1 via ORAL
  Filled 2019-10-22 (×8): qty 1

## 2019-10-22 MED ORDER — MORPHINE SULFATE (PF) 4 MG/ML IV SOLN
6.0000 mg | Freq: Once | INTRAVENOUS | Status: AC
  Administered 2019-10-22: 11:00:00 6 mg via INTRAVENOUS
  Filled 2019-10-22: qty 2

## 2019-10-22 MED ORDER — ACETAMINOPHEN 325 MG PO TABS
650.0000 mg | ORAL_TABLET | Freq: Four times a day (QID) | ORAL | Status: DC | PRN
  Administered 2019-10-24 (×2): 650 mg via ORAL
  Filled 2019-10-22 (×2): qty 2

## 2019-10-22 MED ORDER — INSULIN ASPART 100 UNIT/ML ~~LOC~~ SOLN
0.0000 [IU] | Freq: Three times a day (TID) | SUBCUTANEOUS | Status: DC
  Administered 2019-10-22 – 2019-10-23 (×3): 2 [IU] via SUBCUTANEOUS
  Administered 2019-10-24: 12:00:00 5 [IU] via SUBCUTANEOUS
  Administered 2019-10-24 – 2019-10-25 (×2): 2 [IU] via SUBCUTANEOUS
  Filled 2019-10-22 (×6): qty 1

## 2019-10-22 MED ORDER — EPHEDRINE SULFATE 50 MG/ML IJ SOLN
INTRAMUSCULAR | Status: DC | PRN
  Administered 2019-10-22 (×2): 10 mg via INTRAVENOUS

## 2019-10-22 MED ORDER — SODIUM CHLORIDE 0.9 % IR SOLN
3000.0000 mL | Status: DC
  Administered 2019-10-22: 3000 mL

## 2019-10-22 MED ORDER — ONDANSETRON HCL 4 MG/2ML IJ SOLN
INTRAMUSCULAR | Status: DC | PRN
  Administered 2019-10-22: 4 mg via INTRAVENOUS

## 2019-10-22 MED ORDER — LIDOCAINE HCL (CARDIAC) PF 100 MG/5ML IV SOSY
PREFILLED_SYRINGE | INTRAVENOUS | Status: DC | PRN
  Administered 2019-10-22: 60 mg via INTRAVENOUS

## 2019-10-22 MED ORDER — AMLODIPINE BESYLATE 10 MG PO TABS
10.0000 mg | ORAL_TABLET | Freq: Every day | ORAL | Status: DC
  Administered 2019-10-23 – 2019-10-25 (×3): 10 mg via ORAL
  Filled 2019-10-22 (×3): qty 1

## 2019-10-22 MED ORDER — TRAZODONE HCL 100 MG PO TABS
300.0000 mg | ORAL_TABLET | Freq: Every day | ORAL | Status: DC
  Administered 2019-10-23 – 2019-10-24 (×2): 300 mg via ORAL
  Filled 2019-10-22 (×3): qty 3

## 2019-10-22 MED ORDER — SODIUM CHLORIDE 0.9 % IV SOLN: INTRAVENOUS | Status: DC

## 2019-10-22 MED ORDER — PHENYLEPHRINE HCL (PRESSORS) 10 MG/ML IV SOLN
INTRAVENOUS | Status: DC | PRN
  Administered 2019-10-22 (×8): 100 ug via INTRAVENOUS

## 2019-10-22 MED ORDER — FENTANYL CITRATE (PF) 100 MCG/2ML IJ SOLN: 25.0000 ug | INTRAMUSCULAR | Status: DC | PRN

## 2019-10-22 MED ORDER — LIDOCAINE HCL URETHRAL/MUCOSAL 2 % EX GEL
1.0000 "application " | Freq: Once | CUTANEOUS | Status: AC
  Administered 2019-10-22: 1 via URETHRAL

## 2019-10-22 MED ORDER — ONDANSETRON HCL 4 MG/2ML IJ SOLN
INTRAMUSCULAR | Status: AC
  Filled 2019-10-22: qty 2

## 2019-10-22 MED ORDER — ACETAMINOPHEN 650 MG RE SUPP: 650.0000 mg | Freq: Four times a day (QID) | RECTAL | Status: DC | PRN

## 2019-10-22 MED ORDER — FENTANYL CITRATE (PF) 100 MCG/2ML IJ SOLN
50.0000 ug | Freq: Once | INTRAMUSCULAR | Status: AC
  Administered 2019-10-22: 50 ug via INTRAVENOUS
  Filled 2019-10-22: qty 2

## 2019-10-22 MED ORDER — CIPROFLOXACIN IN D5W 400 MG/200ML IV SOLN
400.0000 mg | INTRAVENOUS | Status: DC
  Administered 2019-10-22 – 2019-10-24 (×3): 400 mg via INTRAVENOUS
  Filled 2019-10-22 (×4): qty 200

## 2019-10-22 SURGICAL SUPPLY — 24 items
ADAPTER IRRIG TUBE 2 SPIKE SOL (ADAPTER) ×3 IMPLANT
BAG DRAIN CYSTO-URO LG1000N (MISCELLANEOUS) ×3 IMPLANT
CATH FOL 3WAY LX COUV 24X75 (CATHETERS) ×3 IMPLANT
COVER WAND RF STERILE (DRAPES) ×3 IMPLANT
ELECT BIVAP BIPO 22/24 DONUT (ELECTROSURGICAL) ×3
ELECT REM PT RETURN 9FT ADLT (ELECTROSURGICAL) ×3
ELECTRD BIVAP BIPO 22/24 DONUT (ELECTROSURGICAL) ×1 IMPLANT
ELECTRODE REM PT RTRN 9FT ADLT (ELECTROSURGICAL) ×1 IMPLANT
GLOVE BIO SURGEON STRL SZ8 (GLOVE) ×3 IMPLANT
GOWN STRL REUS W/ TWL LRG LVL3 (GOWN DISPOSABLE) ×1 IMPLANT
GOWN STRL REUS W/ TWL XL LVL3 (GOWN DISPOSABLE) ×1 IMPLANT
GOWN STRL REUS W/TWL LRG LVL3 (GOWN DISPOSABLE) ×2
GOWN STRL REUS W/TWL XL LVL3 (GOWN DISPOSABLE) ×2
GUIDEWIRE STR DUAL SENSOR (WIRE) ×6 IMPLANT
INTRODUCER DILATOR DOUBLE (INTRODUCER) ×3 IMPLANT
PACK CYSTO AR (MISCELLANEOUS) ×3 IMPLANT
SET CYSTO W/LG BORE CLAMP LF (SET/KITS/TRAYS/PACK) ×3 IMPLANT
SHEATH URETERAL 12FRX35CM (MISCELLANEOUS) ×3 IMPLANT
SOL .9 NS 3000ML IRR  AL (IV SOLUTION) ×6
SOL .9 NS 3000ML IRR UROMATIC (IV SOLUTION) ×3 IMPLANT
SYR TOOMEY IRRIG 70ML (MISCELLANEOUS) ×3
SYRINGE TOOMEY IRRIG 70ML (MISCELLANEOUS) ×1 IMPLANT
WATER STERILE IRR 1000ML POUR (IV SOLUTION) ×3 IMPLANT
WATER STERILE IRR 3000ML UROMA (IV SOLUTION) ×3 IMPLANT

## 2019-10-22 NOTE — Consult Note (Signed)
Urology Consult  Requesting physician: Dr. Ellender Hose  Reason for consultation: Gross hematuria  Chief Complaint: None  History of Present Illness: Darrell [name] is a 79 y.o. male with a complicated medical history including poorly controlled diabetes, HIV, hypertension, chronic anemia and stage IV CKD status post AV fistula placement.  Baseline creatinine between 3-4.  He has a history of T1c Gleason 3+3 adenocarcinoma treated in 2016 in the [place] area.  He was admitted to [facility] December 2020 with significant hematuria requiring multiple blood transfusions.  He was seen by urology there and evaluation included CT scan with hyperdense material within the bladder.  A CT cystogram showed no evidence of perforation.  He underwent cystoscopy in the OR on 09/17/2019 and findings were consistent with radiation cystitis versus tumor.  He underwent bladder biopsy and fulguration.  He was taken back to the OR on 12/29 for repeat fulguration/resection.  Pathologic findings were consistent with chronic inflammation and necrosis and no malignancy was identified.  Due to persistent hematuria he was transferred to [facility].  He saw Dr. Alinda Money who again repeated cystoscopy with clot evacuation and fulguration on 10/11/2019 with findings of diffusely erythematous and edematous bladder mucosa around the bladder neck, lateral walls predominantly on the left side.  There was necrotic tissue within the prostatic area.  He was noted to have active bleeding sites diffusely in the prostate, trigone and lateral walls of the bladder and bladder neck.  Once fulguration was complete a Foley catheter was not placed due to concerns of recurrent bleeding.  He was discharged to an SNF in [place] on 1/19.  A catheter was placed at some point at the SNF due to concerns of overactive bladder.  He presented to the ED today with a 24-hour history of gross hematuria and no output from his catheter.  A three-way Foley  catheter was placed and he was placed on CBI with persistent dark urine according to the ED physician.  Past Medical History:  Diagnosis Date  . Anemia   . Anxiety   . Arthritis   . Cancer [facility])    prostate cancer  . Cataract   . Chronic kidney disease    Stage 4  . Depression   . Diabetes mellitus without complication ([place])   . HIV (human immunodeficiency virus infection) ([place])   . HIV infection ([place])   . Hyperlipidemia   . Hypertension   . Pneumonia 2018  . Schizophrenia ([place])   . Substance abuse [facility])     Past Surgical History:  Procedure Laterality Date  . AV FISTULA PLACEMENT Left 05/01/2019   Procedure: ARTERIOVENOUS (AV) FISTULA CREATION;  Surgeon: Angelia Mould, MD;  Location: [place];  Service: Vascular;  Laterality: Left;  . COLONOSCOPY    . EYE SURGERY    . JOINT REPLACEMENT     both knees  . TRANSURETHRAL RESECTION OF PROSTATE N/A 10/11/2019   Procedure: CYSTOSCOPY CLOT Lula Olszewski;  Surgeon: Raynelle Bring, MD;  Location: [facility];  Service: Urology;  Laterality: N/A;    Home Medications:  Current Meds  Medication Sig  . amLODipine (NORVASC) 10 MG tablet Take 10 mg by mouth daily.  Marland Kitchen atorvastatin (LIPITOR) 20 MG tablet TAKE ONE TABLET BY MOUTH EVERY DAY (Patient taking differently: Take 20 mg by mouth daily. )  . buPROPion (WELLBUTRIN XL) 150 MG 24 hr tablet Take 150 mg by mouth daily.  . Calcium Carbonate (CALCIUM 600 PO) Take 600 mg by mouth  daily.  . DULoxetine (CYMBALTA) 60 MG capsule Take 60 mg by mouth daily.  Marland Kitchen dutasteride (AVODART) 0.5 MG capsule Take 0.5 mg by mouth daily.  Marland Kitchen emtricitabine-rilpivir-tenofovir AF (ODEFSEY) 200-25-25 MG TABS tablet Take 1 tablet by mouth daily.  . fesoterodine (TOVIAZ) 8 MG TB24 tablet Take 8 mg by mouth daily.  Marland Kitchen FIBER PO Take 1 tablet by mouth daily.  . insulin NPH-regular Human (70-30) 100 UNIT/ML injection Inject 80 Units into the skin daily.  Marland Kitchen ketotifen (ZADITOR) 0.025 % ophthalmic solution Place  1 drop into both eyes 2 (two) times daily as needed (irritated eyes).  . Melatonin 5 MG TABS Take 10 mg by mouth at bedtime.   . mirabegron ER (MYRBETRIQ) 25 MG TB24 tablet Take 25 mg by mouth daily.  . Multiple Vitamin (MULTIVITAMIN WITH MINERALS) TABS tablet Take 1 tablet by mouth daily.  . Omega-3 Fatty Acids (FISH OIL) 1000 MG CAPS Take 1,000 mg by mouth 2 (two) times a day.  . risperiDONE (RISPERDAL) 1 MG tablet Take 1 mg by mouth at bedtime.  . Tamsulosin HCl (FLOMAX) 0.4 MG CAPS TAKE ONE CAPSULE BY MOUTH EVERY DAY (Patient taking differently: Take 0.4 mg by mouth daily. )  . traMADol (ULTRAM) 50 MG tablet Take 1 tablet (50 mg total) by mouth every 6 (six) hours as needed.  . trazodone (DESYREL) 300 MG tablet Take 300 mg by mouth at bedtime.  Marland Kitchen zolpidem (AMBIEN) 10 MG tablet Take 10 mg by mouth at bedtime.     Allergies:  Allergies  Allergen Reactions  . Percodan [Oxycodone-Aspirin] Other (See Comments)    Prickly feeling in hands, arms and torso    Family History  Family history unknown: Yes    Social History:  reports that he has never smoked. He has never used smokeless tobacco. He reports previous alcohol use. He reports that he does not use drugs.  ROS: A complete review of systems was performed.  All systems are negative except for pertinent findings as noted.  Physical Exam:  Vital signs in last 24 hours: Temp:  [98.6 F (37 C)] 98.6 F (37 C) (01/25 0917) Pulse Rate:  [85-91] 91 (01/25 1630) Resp:  [16] 16 (01/25 1630) BP: (154-174)/(77-95) 156/84 (01/25 1630) SpO2:  [98 %-100 %] 98 % (01/25 1630) Constitutional:  Alert, No acute distress HEENT: [place] AT, moist mucus membranes.  Trachea midline, no masses Cardiovascular: Regular rate and rhythm, no clubbing, cyanosis, or edema. Respiratory: Normal respiratory effort, lungs clear bilaterally GI: Abdomen is soft, nontender, nondistended, no abdominal masses GU: Three-way Foley catheter in place.  CBI is not running.   Thick, dark blood and Foley catheter tubing. Skin: No rashes, bruises or suspicious lesions Lymph: No cervical or inguinal adenopathy Neurologic: Grossly intact, no focal deficits, moving all 4 extremities Psychiatric: Normal mood    Laboratory Data:  Recent Labs    10/22/19 1014 10/22/19 1336  WBC 7.9 9.6  HGB 9.4* 9.3*  HCT 28.7* 28.0*   Recent Labs    10/22/19 1058  NA 141  K 4.5  CL 112*  CO2 19*  GLUCOSE 145*  BUN 50*  CREATININE 3.43*  CALCIUM 7.9*   No results for input(s): LABPT, INR in the last 72 hours. No results for input(s): LABURIN in the last 72 hours. Results for orders placed or performed during the hospital encounter of 10/22/19  Respiratory Panel by RT PCR (Flu A&B, Covid) - Nasopharyngeal Swab     Status: None   Collection Time:  10/22/19  2:50 PM   Specimen: Nasopharyngeal Swab  Result Value Ref Range Status   SARS Coronavirus 2 by RT PCR NEGATIVE NEGATIVE Final    Comment: (NOTE) SARS-CoV-2 target nucleic acids are NOT DETECTED. The SARS-CoV-2 RNA is generally detectable in upper respiratoy specimens during the acute phase of infection. The lowest concentration of SARS-CoV-2 viral copies this assay can detect is 131 copies/mL. A negative result does not preclude SARS-Cov-2 infection and should not be used as the sole basis for treatment or other patient management decisions. A negative result may occur with  improper specimen collection/handling, submission of specimen other than nasopharyngeal swab, presence of viral mutation(s) within the areas targeted by this assay, and inadequate number of viral copies (<131 copies/mL). A negative result must be combined with clinical observations, patient history, and epidemiological information. The expected result is Negative. Fact Sheet for Patients:  PinkCheek.be Fact Sheet for Healthcare Providers:  GravelBags.it This test is not yet ap  proved or cleared by the [place] FDA and  has been authorized for detection and/or diagnosis of SARS-CoV-2 by FDA under an Emergency Use Authorization (EUA). This EUA will remain  in effect (meaning this test can be used) for the duration of the COVID-19 declaration under Section 564(b)(1) of the Act, 21 U.S.C. section 360bbb-3(b)(1), unless the authorization is terminated or revoked sooner.    Influenza A by PCR NEGATIVE NEGATIVE Final   Influenza B by PCR NEGATIVE NEGATIVE Final    Comment: (NOTE) The Xpert Xpress SARS-CoV-2/FLU/RSV assay is intended as an aid in  the diagnosis of influenza from Nasopharyngeal swab specimens and  should not be used as a sole basis for treatment. Nasal washings and  aspirates are unacceptable for Xpert Xpress SARS-CoV-2/FLU/RSV  testing. Fact Sheet for Patients: PinkCheek.be Fact Sheet for Healthcare Providers: GravelBags.it This test is not yet approved or cleared by the [place] FDA and  has been authorized for detection and/or diagnosis of SARS-CoV-2 by  FDA under an Emergency Use Authorization (EUA). This EUA will remain  in effect (meaning this test can be used) for the duration of the  Covid-19 declaration under Section 564(b)(1) of the Act, 21  U.S.C. section 360bbb-3(b)(1), unless the authorization is  terminated or revoked. Performed at [facility], [address] [place], [place] [address]      Impression/Assessment:  79 y.o. male with a history of prostate cancer status post radiation therapy.  Recent episodes of bleeding felt secondary to radiation cystitis.  Recurrent bleeding within the last 24 hours.  Hematocrit in the ED was 28.7 and stable.  His hematocrit on the day of discharge 1/19 was 27.4.  Plan:  Based on his history and persistent bleeding with irrigation I have recommended cystoscopy with clot evacuation/fulguration under anesthesia.  The  procedure was cussed in detail including potential risk of continued bleeding, infection/sepsis, bladder injury as well as anesthetic risks.  He indicated all questions were answered and desires to proceed.  10/22/2019, 4:59 PM  John Giovanni,  MD

## 2019-10-22 NOTE — Transfer of Care (Addendum)
Immediate Anesthesia Transfer of Care Note  Patient: Darrell [name]  Procedure(s) Performed: CYSTOSCOPY WITH [place] (N/A )  Patient Location: PACU  Anesthesia Type:General  Level of Consciousness: awake and patient cooperative  Airway & Oxygen Therapy: Patient Spontanous Breathing and Patient connected to face mask oxygen  Post-op Assessment: Report given to RN and Post -op Vital signs reviewed and stable  Post vital signs: Reviewed and stable  Last Vitals:  Vitals Value Taken Time  BP 129/72 10/22/19 1926  Temp 36.3 C 10/22/19 1926  Pulse 97 10/22/19 1930  Resp 16 10/22/19 1930  SpO2 100 % 10/22/19 1930  Vitals shown include unvalidated device data.  Last Pain:  Vitals:   10/22/19 1742  TempSrc:   PainSc: 0-No pain         Complications: No apparent anesthesia complications

## 2019-10-22 NOTE — ED Provider Notes (Signed)
[facility] Emergency Department Provider Note  ____________________________________________   First MD Initiated Contact with Patient 10/22/19 [phone]     (approximate)  I have reviewed the triage vital signs and the nursing notes.   HISTORY  Chief Complaint Urinary Retention    HPI Darrell [name] is a 79 y.o. male  Here with urinary retention.  Patient has a history of prostate cancer status post radiation with history of recurrent radiation cystitis, status post recent admission in early January for hematuria requiring bladder fulguration with Dr. Alinda Money.  Over the last 24 hours, patient has begun passing initially small then large clots, and has now had essentially no output from his catheter.  He has been complaining of increasing suprapubic pain.  History somewhat limited due to dementia, but he does describe severe, aching, spasm-like, throbbing, suprapubic pain.  It occasionally radiates to his bilateral flanks.  No nausea or vomiting.  No fevers.  No other complaints.       Past Medical History:  Diagnosis Date  . Anemia   . Anxiety   . Arthritis   . Cancer [facility])    prostate cancer  . Cataract   . Chronic kidney disease    Stage 4  . Depression   . Diabetes mellitus without complication ([place])   . HIV (human immunodeficiency virus infection) ([place])   . HIV infection ([place])   . Hyperlipidemia   . Hypertension   . Pneumonia 2018  . Schizophrenia ([place])   . Substance abuse [facility])     Patient Active Problem List   Diagnosis Date Noted  . Hematuria 10/10/2019  . CKD (chronic kidney disease) stage 4, GFR 15-29 ml/min (HCC) 10/10/2019  . DM (diabetes mellitus), type 2 with renal complications ([place]) 78/58/8502  . Acute blood loss anemia 10/10/2019  . Essential hypertension 10/10/2019  . Thrombocytopenia ([place]) 10/10/2019  . Polyp in nasopharynx 12/22/2016  . Memory loss 12/08/2016  . Numbness and tingling of both feet 11/17/2016  . Unsteadiness  11/17/2016  . Facet arthropathy, lumbosacral 06/30/2016    Past Surgical History:  Procedure Laterality Date  . AV FISTULA PLACEMENT Left 05/01/2019   Procedure: ARTERIOVENOUS (AV) FISTULA CREATION;  Surgeon: Angelia Mould, MD;  Location: [place];  Service: Vascular;  Laterality: Left;  . COLONOSCOPY    . EYE SURGERY    . JOINT REPLACEMENT     both knees  . TRANSURETHRAL RESECTION OF PROSTATE N/A 10/11/2019   Procedure: CYSTOSCOPY CLOT Lula Olszewski;  Surgeon: Raynelle Bring, MD;  Location[facility];  Service: Urology;  Laterality: N/A;    Prior to Admission medications   Medication Sig Start Date End Date Taking? Authorizing Provider  amLODipine (NORVASC) 10 MG tablet Take 10 mg by mouth daily.    [provider]  atorvastatin (LIPITOR) 20 MG tablet TAKE ONE TABLET BY MOUTH EVERY DAY Patient taking differently: Take 20 mg by mouth daily.  10/02/12   Truman Hayward, MD  buPROPion (WELLBUTRIN XL) 150 MG 24 hr tablet Take 150 mg by mouth daily. 06/26/19   [provider]  Calcium Carbonate (CALCIUM 600 PO) Take 600 mg by mouth daily.    [provider]  DULoxetine (CYMBALTA) 60 MG capsule Take 60 mg by mouth daily.    [provider]  dutasteride (AVODART) 0.5 MG capsule Take 0.5 mg by mouth daily.    [provider]  emtricitabine-rilpivir-tenofovir AF (ODEFSEY) 200-25-25 MG TABS tablet Take 1 tablet by mouth daily. 04/26/16  [provider]  fesoterodine (TOVIAZ) 8 MG TB24 tablet Take 8 mg by mouth daily.    [provider]  FIBER PO Take 1 tablet by mouth daily.    [provider]  insulin NPH-regular Human (70-30) 100 UNIT/ML injection Inject 80 Units into the skin daily. 10/15/19   Shelly Coss, MD  ketotifen (ZADITOR) 0.025 % ophthalmic solution Place 1 drop into both eyes 2 (two) times daily as needed (irritated eyes).    [provider]  Melatonin 5 MG TABS Take 10 mg by mouth at  bedtime.     [provider]  mirabegron ER (MYRBETRIQ) 25 MG TB24 tablet Take 25 mg by mouth daily.    [provider]  Multiple Vitamin (MULTIVITAMIN WITH MINERALS) TABS tablet Take 1 tablet by mouth daily.    [provider]  Omega-3 Fatty Acids (FISH OIL) 1000 MG CAPS Take 1,000 mg by mouth 2 (two) times a day.    [provider]  risperiDONE (RISPERDAL) 1 MG tablet Take 1 mg by mouth at bedtime.    [provider]  Tamsulosin HCl (FLOMAX) 0.4 MG CAPS TAKE ONE CAPSULE BY MOUTH EVERY DAY Patient taking differently: Take 0.4 mg by mouth daily.  10/20/11   Truman Hayward, MD  traMADol (ULTRAM) 50 MG tablet Take 1 tablet (50 mg total) by mouth every 6 (six) hours as needed. Patient not taking: Reported on 10/09/2019 05/01/19   Angelia Mould, MD  trazodone (DESYREL) 300 MG tablet Take 300 mg by mouth at bedtime.    [provider]  zolpidem (AMBIEN) 10 MG tablet Take 10 mg by mouth at bedtime.     [provider]    Allergies Percodan [oxycodone-aspirin]  Family History  Family history unknown: Yes    Social History Social History   Tobacco Use  . Smoking status: Never Smoker  . Smokeless tobacco: Never Used  Substance Use Topics  . Alcohol use: Not Currently    Comment: heavy drinker in the past (no alchol for ? 10-15 years )  . Drug use: Never    Review of Systems  Review of Systems  Constitutional: Negative for chills, fatigue and fever.  HENT: Negative for sore throat.   Respiratory: Negative for shortness of breath.   Cardiovascular: Negative for chest pain.  Gastrointestinal: Positive for abdominal pain.  Genitourinary: Positive for difficulty urinating and hematuria. Negative for flank pain.  Musculoskeletal: Negative for neck pain.  Skin: Negative for rash and wound.  Allergic/Immunologic: Negative for immunocompromised state.  Neurological: Negative for weakness and numbness.  Hematological:  Does not bruise/bleed easily.     ____________________________________________  PHYSICAL EXAM:      VITAL SIGNS: ED Triage Vitals [10/22/19 0917]  Enc Vitals Group     BP (!) 154/77     Pulse Rate 85     Resp 16     Temp 98.6 F (37 C)     Temp Source Oral     SpO2 99 %     Weight      Height      Head Circumference      Peak Flow      Pain Score 9     Pain Loc      Pain Edu?      Excl. in [place]?      Physical Exam Vitals and nursing note reviewed.  Constitutional:      General: He is not in acute distress.  Appearance: He is well-developed.     Comments: Appears distressed, uncomfortable  HENT:     Head: Normocephalic and atraumatic.  Eyes:     Conjunctiva/sclera: Conjunctivae normal.  Cardiovascular:     Rate and Rhythm: Normal rate and regular rhythm.     Heart sounds: Normal heart sounds.  Pulmonary:     Effort: Pulmonary effort is normal. No respiratory distress.     Breath sounds: No wheezing.  Abdominal:     General: Abdomen is flat. There is no distension.     Tenderness: There is abdominal tenderness (Distended, tender bladder).  Genitourinary:    Comments: Superficial ulceration noted to glans, no purulence Musculoskeletal:     Cervical back: Neck supple.  Skin:    General: Skin is warm.     Capillary Refill: Capillary refill takes less than 2 seconds.     Findings: No rash.  Neurological:     Mental Status: He is alert and oriented to person, place, and time.     Motor: No abnormal muscle tone.       ____________________________________________   LABS (all labs ordered are listed, but only abnormal results are displayed)  Labs Reviewed  CBC WITH DIFFERENTIAL/PLATELET - Abnormal; Notable for the following components:      Result Value   RBC 3.05 (*)    Hemoglobin 9.4 (*)    HCT 28.7 (*)    Platelets 118 (*)    All other components within normal limits  URINALYSIS, COMPLETE (UACMP) WITH MICROSCOPIC - Abnormal; Notable for the  following components:   Color, Urine RED (*)    APPearance HAZY (*)    Glucose, UA   (*)    Value: TEST NOT REPORTED DUE TO COLOR INTERFERENCE OF URINE PIGMENT   Hgb urine dipstick   (*)    Value: TEST NOT REPORTED DUE TO COLOR INTERFERENCE OF URINE PIGMENT   Bilirubin Urine   (*)    Value: TEST NOT REPORTED DUE TO COLOR INTERFERENCE OF URINE PIGMENT   Ketones, ur   (*)    Value: TEST NOT REPORTED DUE TO COLOR INTERFERENCE OF URINE PIGMENT   Protein, ur   (*)    Value: TEST NOT REPORTED DUE TO COLOR INTERFERENCE OF URINE PIGMENT   Nitrite   (*)    Value: TEST NOT REPORTED DUE TO COLOR INTERFERENCE OF URINE PIGMENT   Leukocytes,Ua   (*)    Value: TEST NOT REPORTED DUE TO COLOR INTERFERENCE OF URINE PIGMENT   RBC / HPF >50 (*)    WBC, UA >50 (*)    All other components within normal limits  BASIC METABOLIC PANEL - Abnormal; Notable for the following components:   Chloride 112 (*)    CO2 19 (*)    Glucose, Bld 145 (*)    BUN 50 (*)    Creatinine, Ser 3.43 (*)    Calcium 7.9 (*)    GFR calc non Af Amer 16 (*)    GFR calc Af Amer 19 (*)    All other components within normal limits  CBC - Abnormal; Notable for the following components:   RBC 3.02 (*)    Hemoglobin 9.3 (*)    HCT 28.0 (*)    Platelets 124 (*)    All other components within normal limits  TYPE AND SCREEN    ____________________________________________  EKG:  ________________________________________  RADIOLOGY All imaging, including plain films, CT scans, and ultrasounds, independently reviewed by me, and interpretations confirmed via formal radiology  reads.  ED MD interpretation:     Official radiology report(s): No results found.  ____________________________________________  PROCEDURES   Procedure(s) performed (including Critical Care):  Triple-Lumen catheter insertion  Date/Time: 10/22/2019 2:48 PM Performed by: Duffy Bruce, MD Authorized by: Duffy Bruce, MD  Consent: The procedure  was performed in an emergent situation. Verbal consent obtained. Risks and benefits: risks, benefits and alternatives were discussed Consent given by: patient Patient identity confirmed: arm band Time out: Immediately prior to procedure a "time out" was called to verify the correct patient, procedure, equipment, support staff and site/side marked as required. Preparation: Patient was prepped and draped in the usual sterile fashion. Local anesthesia used: no  Anesthesia: Local anesthesia used: no  Sedation: Patient sedated: no  Patient tolerance: patient tolerated the procedure well with no immediate complications Comments: Lidocaine gel used for anesthesia.  Three-way catheter placed by myself, without acute complications.     ____________________________________________  INITIAL IMPRESSION / MDM / ASSESSMENT AND PLAN / ED COURSE  As part of my medical decision making, I reviewed the following data within the [place] notes reviewed and incorporated, Old chart reviewed, Notes from prior ED visits, and [place] Controlled Substance Database       *Silus Lanzo was evaluated in Emergency Department on 10/22/2019 for the symptoms described in the history of present illness. He was evaluated in the context of the global COVID-19 pandemic, which necessitated consideration that the patient might be at risk for infection with the SARS-CoV-2 virus that causes COVID-19. Institutional protocols and algorithms that pertain to the evaluation of patients at risk for COVID-19 are in a state of rapid change based on information released by regulatory bodies including the CDC and federal and state organizations. These policies and algorithms were followed during the patient's care in the ED.  Some ED evaluations and interventions may be delayed as a result of limited staffing during the pandemic.*     Medical Decision Making:  79 year old male here with gross hematuria in the  setting of chronic radiation cystitis.  Three-way catheter placed by myself, and continuous bladder irrigation initiated with return of persistently bloody urine.  Patient has a history of recent admission requiring multiple transfusions for the same.  Discussed case with Dr. Bernardo Heater.  Will admit for monitoring CBC and continuous bladder irrigation, with close follow-up.  He does have pyuria, but normal white count and I suspect this is all inflammatory secondary to recent instrumentation rather than infection.  ____________________________________________  FINAL CLINICAL IMPRESSION(S) / ED DIAGNOSES  Final diagnoses:  Gross hematuria     MEDICATIONS GIVEN DURING THIS VISIT:  Medications  sodium chloride irrigation 0.9 % 3,000 mL (3,000 mLs Irrigation New Bag/Given 10/22/19 1255)  fentaNYL (SUBLIMAZE) injection 50 mcg (50 mcg Intravenous Given 10/22/19 1026)  lidocaine (XYLOCAINE) 2 % jelly 1 application (1 application Urethral Given 10/22/19 1255)  ondansetron (ZOFRAN) injection 4 mg (4 mg Intravenous Given 10/22/19 1024)  morphine 4 MG/ML injection 6 mg (6 mg Intravenous Given 10/22/19 1054)     ED Discharge Orders    None       Note:  This document was prepared using Dragon voice recognition software and may include unintentional dictation errors.   Duffy Bruce, MD 10/22/19 [phone]

## 2019-10-22 NOTE — Anesthesia Procedure Notes (Signed)
Procedure Name: Intubation Date/Time: 10/22/2019 6:09 PM Performed by: Lendon Colonel, CRNA Pre-anesthesia Checklist: Patient identified, Patient being monitored, Timeout performed, Emergency Drugs available and Suction available Patient Re-evaluated:Patient Re-evaluated prior to induction Oxygen Delivery Method: Circle system utilized Preoxygenation: Pre-oxygenation with 100% oxygen Induction Type: IV induction and Rapid sequence Laryngoscope Size: 3 and McGraph Grade View: Grade I Tube type: Oral Tube size: 7.5 mm Number of attempts: 1 Airway Equipment and Method: Stylet Placement Confirmation: ETT inserted through vocal cords under direct vision,  positive ETCO2 and breath sounds checked- equal and bilateral Secured at: 21 cm Tube secured with: Tape Dental Injury: Teeth and Oropharynx as per pre-operative assessment

## 2019-10-22 NOTE — ED Notes (Signed)
Bladder scan showed aprox 363mL of urine, MD Issacs made aware

## 2019-10-22 NOTE — Anesthesia Preprocedure Evaluation (Addendum)
Anesthesia Evaluation  Patient identified by MRN, date of birth, ID band Patient awake    Reviewed: Allergy & Precautions, Patient's Chart, lab work & pertinent test results  Airway Mallampati: II  TM Distance: >3 FB Neck ROM: Full    Dental no notable dental hx. (+) Poor Dentition, Dental Advisory Given   Pulmonary    Pulmonary exam normal breath sounds clear to auscultation       Cardiovascular hypertension, Pt. on medications Normal cardiovascular exam Rhythm:Regular Rate:Normal     Neuro/Psych PSYCHIATRIC DISORDERS Anxiety Depression Schizophrenia negative neurological ROS     GI/Hepatic (+)     substance abuse  ,   Endo/Other  diabetes, Type 1  Renal/GU CRFRenal diseaseCr 3.47 K+ 4.8      Musculoskeletal  (+) Arthritis , Osteoarthritis,    Abdominal (+) + obese,   Peds  Hematology  (+) anemia , Hgb 6.5 Plt 129   Anesthesia Other Findings Past Medical History: No date: Anemia No date: Anxiety No date: Arthritis No date: Cancer [facility])     Comment:  prostate cancer No date: Cataract No date: Chronic kidney disease     Comment:  Stage 4 No date: Depression No date: Diabetes mellitus without complication (HCC) No date: HIV (human immunodeficiency virus infection) (HCC) No date: HIV infection ([place]) No date: Hyperlipidemia No date: Hypertension 2018: Pneumonia No date: Schizophrenia ([place]) No date: Substance abuse ([place])  Reproductive/Obstetrics                            Anesthesia Physical  Anesthesia Plan  ASA: IV and emergent  Anesthesia Plan: General   Post-op Pain Management:    Induction: Intravenous  PONV Risk Score and Plan: Treatment may vary due to age or medical condition  Airway Management Planned: LMA and Oral ETT  Additional Equipment: None  Intra-op Plan:   Post-operative Plan: Extubation in OR  Informed Consent: I have reviewed the patients  History and Physical, chart, labs and discussed the procedure including the risks, benefits and alternatives for the proposed anesthesia with the patient or authorized representative who has indicated his/her understanding and acceptance.     Dental advisory given  Plan Discussed with:   Anesthesia Plan Comments:         Anesthesia Quick Evaluation

## 2019-10-22 NOTE — H&P (Signed)
History and Physical    Darrell [name] WOE:[id number] DOB: Jan 01, 1941 DOA: 10/22/2019  **Will admit patient based on the expectation that the patient will need hospitalization/ hospital care that crosses at least 2 midnights  PCP: Cher Nakai, MD   Attending physician: Fritzi Mandes, MD  Patient coming from/Resides with: SNF  Chief Complaint: Recurrent gross hematuria with clots and urinary retention  HPI: Darrell [name] is a 79 y.o. male with medical history significant for CKD 4 with AV fistula in place, diabetes mellitus type 2, HIV, hypertension, chronic normocytic anemia, history of prostate cancer status post radiation treatment.  Patient has had recurrent issues with gross hematuria with hospitalizations in December 2020 as well as January 2021.  In December he failed continuous bladder irrigation and underwent cystoscopy with subsequent biopsy and resection and fulguration of a necrotic bladder tumor.  Pathology without evidence of malignancy.  He was taken back to the OR January 7 and underwent a TUR of a necrotic bladder tumor with adjacent prostatic tissue.  Despite all of this patient continued to have gross hematuria and he was subsequently transferred to [facility] for more detailed urological care.  During that hospitalization patient required 12 units of PRBCs.  He also had positive Pseudomonas growing in his urine and was treated for that with antibiotics.  He had issues with neurogenic bladder and a Foley catheter was placed.  Urology surmised that these issues were likely from radiation cystitis from history of prostate cancer.  He subsequently underwent an additional clot evacuation and fulguration of bleeding sites on 1/14 2021.  He was discharged to a skilled nursing facility and was referred to the wound care center by urology with plans to schedule hyperbaric oxygen therapy prevent further episodes of radiation cystitis.  He returns to the ER today with suprapubic pain,  gross hematuria and inability to pass urine through indwelling catheter.  EDP subsequently changed out all catheter and placed a irrigating Foley and patient reports improved symptoms although on exam continues to have suprapubic pain and cherry-red gross hematuria.  Dr. Katheren Shams was consulted and anticipates patient will likely require intraoperative treatment i.e. cystoscopy on 1/26.  Labs were stable with creatinine at baseline. Hgb stable at 9.3.  Urinalysis significantly abnormal but difficult to interpret given degree of gross hematuria.  ED Course:  Vital Signs: BP (!) 174/90   Pulse 91   Temp 98.6 F (37 C) (Oral)   Resp 16   SpO2 100%   Review of Systems:  In addition to the HPI above,  No Fever-chills, myalgias or other constitutional symptoms No Headache, changes with Vision or hearing, new weakness, tingling, numbness in any extremity, dizziness, dysarthria or word finding difficulty, gait disturbance or imbalance, tremors or seizure activity No problems swallowing food or Liquids, indigestion/reflux, choking or coughing while eating, abdominal pain with or after eating No Chest pain, Cough or Shortness of Breath, palpitations, orthopnea or DOE Acute onset of suprapubic abdominal pain over the past 24 hours, no N/V, melena,hematochezia, dark tarry stools, constipation No dysuria, malodorous urine, + gross hematuria for 24 hours without flank pain-presents with Foley catheter in place with minimal urine output No new skin rashes, lesions, masses or bruises, No new joint pains, aches, swelling or redness No recent unintentional weight gain or loss No polyuria, polydypsia or polyphagia   Past Medical History:  Diagnosis Date  . Anemia   . Anxiety   . Arthritis   . Cancer [facility])    prostate cancer  .  Cataract   . Chronic kidney disease    Stage 4  . Depression   . Diabetes mellitus without complication ([place])   . HIV (human immunodeficiency virus infection) ([place])   .  HIV infection ([place])   . Hyperlipidemia   . Hypertension   . Pneumonia 2018  . Schizophrenia ([place])   . Substance abuse [facility])     Past Surgical History:  Procedure Laterality Date  . AV FISTULA PLACEMENT Left 05/01/2019   Procedure: ARTERIOVENOUS (AV) FISTULA CREATION;  Surgeon: Angelia Mould, MD;  Location: [place];  Service: Vascular;  Laterality: Left;  . COLONOSCOPY    . EYE SURGERY    . JOINT REPLACEMENT     both knees  . TRANSURETHRAL RESECTION OF PROSTATE N/A 10/11/2019   Procedure: CYSTOSCOPY CLOT Lula Olszewski;  Surgeon: Raynelle Bring, MD;  Location: [facility];  Service: Urology;  Laterality: N/A;    Social History   Socioeconomic History  . Marital status: Single    Spouse name: Not on file  . Number of children: Not on file  . Years of education: Not on file  . Highest education level: Not on file  Occupational History  . Not on file  Tobacco Use  . Smoking status: Never Smoker  . Smokeless tobacco: Never Used  Substance and Sexual Activity  . Alcohol use: Not Currently    Comment: heavy drinker in the past (no alchol for ? 10-15 years )  . Drug use: Never  . Sexual activity: Not on file  Other Topics Concern  . Not on file  Social History Narrative  . Not on file   Social Determinants of Health   Financial Resource Strain:   . Difficulty of Paying Living Expenses: Not on file  Food Insecurity:   . Worried About Charity fundraiser in the Last Year: Not on file  . Ran Out of Food in the Last Year: Not on file  Transportation Needs:   . Lack of Transportation (Medical): Not on file  . Lack of Transportation (Non-Medical): Not on file  Physical Activity:   . Days of Exercise per Week: Not on file  . Minutes of Exercise per Session: Not on file  Stress:   . Feeling of Stress : Not on file  Social Connections:   . Frequency of Communication with Friends and Family: Not on file  . Frequency of Social Gatherings with Friends and Family: Not  on file  . Attends Religious Services: Not on file  . Active Member of Clubs or Organizations: Not on file  . Attends Archivist Meetings: Not on file  . Marital Status: Not on file  Intimate Partner Violence:   . Fear of Current or Ex-Partner: Not on file  . Emotionally Abused: Not on file  . Physically Abused: Not on file  . Sexually Abused: Not on file    Mobility: Requires moderate assistance for mobility specially position changes prompting discharge to skilled nursing facility during recent admission Work history: Not obtained   Allergies  Allergen Reactions  . Percodan [Oxycodone-Aspirin] Other (See Comments)    Prickly feeling in hands, arms and torso    Family History  Family history unknown: Yes     Prior to Admission medications   Medication Sig Start Date End Date Taking? Authorizing Provider  amLODipine (NORVASC) 10 MG tablet Take 10 mg by mouth daily.   Yes [provider]  atorvastatin (LIPITOR) 20 MG tablet TAKE ONE TABLET BY  MOUTH EVERY DAY Patient taking differently: Take 20 mg by mouth daily.  10/02/12  Yes Tommy Medal, Lavell Islam, MD  buPROPion (WELLBUTRIN XL) 150 MG 24 hr tablet Take 150 mg by mouth daily. 06/26/19  Yes [provider]  Calcium Carbonate (CALCIUM 600 PO) Take 600 mg by mouth daily.   Yes [provider]  DULoxetine (CYMBALTA) 60 MG capsule Take 60 mg by mouth daily.   Yes [provider]  dutasteride (AVODART) 0.5 MG capsule Take 0.5 mg by mouth daily.   Yes [provider]  emtricitabine-rilpivir-tenofovir AF (ODEFSEY) 200-25-25 MG TABS tablet Take 1 tablet by mouth daily. 04/26/16  Yes [provider]  fesoterodine (TOVIAZ) 8 MG TB24 tablet Take 8 mg by mouth daily.   Yes [provider]  FIBER PO Take 1 tablet by mouth daily.   Yes [provider]  insulin NPH-regular Human (70-30) 100 UNIT/ML injection Inject 80 Units into the skin daily. 10/15/19  Yes Shelly Coss, MD  ketotifen (ZADITOR) 0.025 % ophthalmic solution Place 1 drop into both eyes 2 (two) times daily as needed (irritated eyes).   Yes [provider]  Melatonin 5 MG TABS Take 10 mg by mouth at bedtime.    Yes [provider]  mirabegron ER (MYRBETRIQ) 25 MG TB24 tablet Take 25 mg by mouth daily.   Yes [provider]  Multiple Vitamin (MULTIVITAMIN WITH MINERALS) TABS tablet Take 1 tablet by mouth daily.   Yes [provider]  Omega-3 Fatty Acids (FISH OIL) 1000 MG CAPS Take 1,000 mg by mouth 2 (two) times a day.   Yes [provider]  risperiDONE (RISPERDAL) 1 MG tablet Take 1 mg by mouth at bedtime.   Yes [provider]  Tamsulosin HCl (FLOMAX) 0.4 MG CAPS TAKE ONE CAPSULE BY MOUTH EVERY DAY Patient taking differently: Take 0.4 mg by mouth daily.  10/20/11  Yes Tommy Medal, Lavell Islam, MD  traMADol (ULTRAM) 50 MG tablet Take 1 tablet (50 mg total) by mouth every 6 (six) hours as needed. 05/01/19  Yes Angelia Mould, MD  trazodone (DESYREL) 300 MG tablet Take 300 mg by mouth at bedtime.   Yes [provider]  zolpidem (AMBIEN) 10 MG tablet Take 10 mg by mouth at bedtime.    Yes [provider]    Physical Exam: Vitals:   10/22/19 0917 10/22/19 0930 10/22/19 1030  BP: (!) 154/77 (!) 160/95 (!) 174/90  Pulse: 85 90 91  Resp: 16    Temp: 98.6 F (37 C)    TempSrc: Oral    SpO2: 99% 100% 100%      Constitutional: NAD, calm, comfortable Eyes: PERRL, lids and conjunctivae normal ENMT: Mucous membranes are moist. Posterior pharynx clear of any exudate or lesions.Normal dentition.  Neck: normal, supple, no masses, no thyromegaly Respiratory: clear to auscultation bilaterally, no wheezing, no crackles. Normal respiratory effort. No accessory muscle use.  Cardiovascular: Regular rate and rhythm, no murmurs / rubs / gallops. No extremity edema. 2+ pedal pulses. No carotid bruits.  Abdomen: Focal tenderness  minimal palpation over suprapubic area, no masses palpated. No hepatosplenomegaly. Bowel sounds positive GU: Irrigating Foley in place with dark cherry red hematuria noted.  Musculoskeletal: no clubbing / cyanosis. No joint deformity upper and lower extremities. Good ROM, no contractures. Normal muscle tone.  Skin: no rashes, lesions, ulcers. No induration Neurologic: CN 2-12 grossly intact. Sensation intact, DTR normal. Strength 4/5 x all 4 extremities.  Psychiatric: Alert and  oriented x 3. Normal mood.    Labs on Admission: I have personally reviewed following labs and imaging studies  CBC: Recent Labs  Lab 10/16/19 0435 10/22/19 1014 10/22/19 1336  WBC 4.9 7.9 9.6  NEUTROABS 2.7 5.5  --   HGB 8.8* 9.4* 9.3*  HCT 27.4* 28.7* 28.0*  MCV 95.5 94.1 92.7  PLT 114* 118* 300*   Basic Metabolic Panel: Recent Labs  Lab 10/16/19 0435 10/22/19 1058  NA 141 141  K 4.6 4.5  CL 111 112*  CO2 24 19*  GLUCOSE 119* 145*  BUN 63* 50*  CREATININE 2.98* 3.43*  CALCIUM 8.4* 7.9*   GFR: Estimated Creatinine Clearance: 21.8 mL/min (A) (by C-G formula based on SCr of 3.43 mg/dL (H)). Liver Function Tests: No results for input(s): AST, ALT, ALKPHOS, BILITOT, PROT, ALBUMIN in the last 168 hours. No results for input(s): LIPASE, AMYLASE in the last 168 hours. No results for input(s): AMMONIA in the last 168 hours. Coagulation Profile: No results for input(s): INR, PROTIME in the last 168 hours. Cardiac Enzymes: No results for input(s): CKTOTAL, CKMB, CKMBINDEX, TROPONINI in the last 168 hours. BNP (last 3 results) No results for input(s): PROBNP in the last 8760 hours. HbA1C: No results for input(s): HGBA1C in the last 72 hours. CBG: Recent Labs  Lab 10/15/19 1653 10/15/19 2051 10/16/19 0738 10/16/19 1117  GLUCAP 117* 147* 113* 165*   Lipid Profile: No results for input(s): CHOL, HDL, LDLCALC, TRIG, CHOLHDL, LDLDIRECT in the last 72 hours. Thyroid Function Tests: No results for  input(s): TSH, T4TOTAL, FREET4, T3FREE, THYROIDAB in the last 72 hours. Anemia Panel: No results for input(s): VITAMINB12, FOLATE, FERRITIN, TIBC, IRON, RETICCTPCT in the last 72 hours. Urine analysis:    Component Value Date/Time   COLORURINE RED (A) 10/22/2019 1336   APPEARANCEUR HAZY (A) 10/22/2019 1336   LABSPEC 1.005 10/22/2019 1336   PHURINE  10/22/2019 1336    TEST NOT REPORTED DUE TO COLOR INTERFERENCE OF URINE PIGMENT   GLUCOSEU (A) 10/22/2019 1336    TEST NOT REPORTED DUE TO COLOR INTERFERENCE OF URINE PIGMENT   HGBUR (A) 10/22/2019 1336    TEST NOT REPORTED DUE TO COLOR INTERFERENCE OF URINE PIGMENT   BILIRUBINUR (A) 10/22/2019 1336    TEST NOT REPORTED DUE TO COLOR INTERFERENCE OF URINE PIGMENT   KETONESUR (A) 10/22/2019 1336    TEST NOT REPORTED DUE TO COLOR INTERFERENCE OF URINE PIGMENT   PROTEINUR (A) 10/22/2019 1336    TEST NOT REPORTED DUE TO COLOR INTERFERENCE OF URINE PIGMENT   NITRITE (A) 10/22/2019 1336    TEST NOT REPORTED DUE TO COLOR INTERFERENCE OF URINE PIGMENT   LEUKOCYTESUR (A) 10/22/2019 1336    TEST NOT REPORTED DUE TO COLOR INTERFERENCE OF URINE PIGMENT   Sepsis Labs: @LABRCNTIP (procalcitonin:4,lacticidven:4) ) Recent Results (from the past 240 hour(s))  Culture, Urine     Status: Abnormal   Collection Time: 10/12/19  4:27 PM   Specimen: Urine, Random  Result Value Ref Range Status   Specimen Description   Final    URINE, RANDOM Performed at [facility], [place] [address], [place], [place] [address]    Special Requests   Final    NONE Performed at [facility], [place] [address] [place], [place] [address]    Culture (A)  Final    <10,000 COLONIES/mL INSIGNIFICANT GROWTH Performed at [place] [address] [place], [place] [address]    Report Status 10/13/2019 FINAL  Final  SARS CORONAVIRUS 2 (TAT 6-24 HRS) Nasopharyngeal Nasopharyngeal Swab     Status: None   Collection Time: 10/15/19  2:02  PM   Specimen: Nasopharyngeal Swab  Result Value Ref Range Status   SARS Coronavirus 2 NEGATIVE NEGATIVE Final    Comment: (NOTE) SARS-CoV-2 target nucleic acids are NOT DETECTED. The SARS-CoV-2 RNA is generally detectable in upper and lower respiratory specimens during the acute phase of infection. Negative results do not preclude SARS-CoV-2 infection, do not rule out co-infections with other pathogens, and should not be used as the sole basis for treatment or other patient management decisions. Negative results must be combined with clinical observations, patient history, and epidemiological information. The expected result is Negative. Fact Sheet for Patients: SugarRoll.be Fact Sheet for Healthcare Providers: https://www.woods-mathews.com/ This test is not yet approved or cleared by the [place] FDA and  has been authorized for detection and/or diagnosis of SARS-CoV-2 by FDA under an Emergency Use Authorization (EUA). This EUA will remain  in effect (meaning this test can be used) for the duration of the COVID-19 declaration under Section 56 4(b)(1) of the Act, 21 U.S.C. section 360bbb-3(b)(1), unless the authorization is terminated or revoked sooner. Performed at [facility], [place] [address] [place], [place] [address]      Radiological Exams on Admission: No results found.  Assessment/Plan Principal Problem: Acute recurrent gross hematuria/recurrent radiation cystitis -Patient presents with recurrent hematuria causing partial urinary retention despite Foley catheter-retention symptoms have improved with initiation of three-way bladder irrigation but patient continues to have gross hematuria and suprapubic discomfort -He has been evaluated by urology/Dr. Bernardo Heater who is anticipating likely recurrent cystoscopy procedure with fulguration on 1/26 therefore patient will be made n.p.o. after midnight -Continuous bladder  irrigation -Empiric Cipro (renal dose adjusted) with history of prior Pseudomonas infection and recurrent instrumentation since December with ongoing bleeding -Tramadol for pain -Continue preadmission Avodart,Flomax  and Toviaz  Active Problems:   Diabetes mellitus without complication ([place]) -Follow CBG AC/at bedtime and provide SSI -Hold NPH insulin for now -Allow carb modified diet but will need to be n.p.o. after midnight    Anemia -Hemoglobin stable at 9.3 -During previous hospitalization had significant enough urinary tract blood loss to require transfusions so monitor closely    CKD (chronic kidney disease) stage 5, GFR less than 15 ml/min (HCC) -BUN and creatinine stable at baseline with current readings 50/3.43    HIV infection ([place]) -Continue preadmission antiviral 3 drug cocktail    Hyperlipidemia -Continue statin    Schizophrenia ([place]) -Continue preadmission psychotropic drugs    **Additional lab, imaging and/or diagnostic evaluation at discretion of supervising physician  DVT prophylaxis: SCDs Code Status: DNR Family Communication: Patient only Disposition Plan: Inpatient Consults called: Urology/[place]    Erin Hearing ANP-BC [facility] Pager [phone]   If 7PM-7AM, please contact night-coverage [email or web address] Password [facility]  10/22/2019, 3:24 PM

## 2019-10-22 NOTE — ED Notes (Signed)
After pt's bladder irrigation, urine still has blood clots and urine is pink-tinged at this time.

## 2019-10-22 NOTE — ED Triage Notes (Addendum)
Pt from [facility] with c/o urinary retention since yesterday along with passing blood clots this am per pt. VS, [id number]&OX4

## 2019-10-22 NOTE — Op Note (Signed)
Preoperative diagnosis:  1. Gross hematuria secondary to radiation cystitis  Postoperative diagnosis:  1. Same  Procedure: 1. Cystoscopy with clot evacuation 2. Bladder fulguration  Surgeon: Abbie Sons, MD  Anesthesia: General  Complications: None  Intraoperative findings:  -Urethra without lesions or strictures -Mild lateral lobe enlargement distally with open bladder neck and necrotic appearing tissue consistent with prior resection -Diffuse venous bleeding sites primarily right bladder neck 8-11 o'clock position and the adjacent bladder mucosa just inside the bladder neck -Mild oozing to a lesser extent left bladder neck and left bladder wall -TUR changes left bladder wall -Inflammatory changes trigone but no active bleeding -Right ureteral orifice identified -Left ureteral orifice not definitely defined however no bleeding noted left hemitrigone and no fulguration performed in the areas of the UOs  EBL: Minimal  Specimens: None  Indication: Darrell [name] is a 79 y.o. patient with recurrent gross hematuria secondary to radiation cystitis. Prior fulguration, resection for hemodynamically significant bleeding in [facility] December 2020. Cystoscopy clot evacuation/fulguration performed by Dr. Alinda Money in [facility] 1/14. Patient presented to [facility] ED for recurrent bleeding with clotting of his catheter. Refer to my consult note for details. After reviewing the management options for treatment, he elected to proceed with the above surgical procedure(s). We have discussed the potential benefits and risks of the procedure, side effects of the proposed treatment, the likelihood of the patient achieving the goals of the procedure, and any potential problems that might occur during the procedure or recuperation. Informed consent has been obtained.  Description of procedure:  The patient was taken to the operating room and general anesthesia was induced.  The patient was placed in the  dorsal lithotomy position, Foley catheter was removed and he was prepped and draped in the usual sterile fashion. Preoperative antibiotics were administered in the ED. A preoperative time-out was performed.   A 21 French cystoscope was lubricated and passed under direct vision. Once in the bladder approximately 15 syringeful's of dark clot and burgundy effluent was irrigated from the bladder. Once all clot was removed there was significant improvement in the effluent.  The cystoscope was removed and a 26 French continuous-flow resectoscope sheath with visual obturator was passed under direct vision with findings as described above. The obturator was removed and replaced with an Nickerson with button electrode. Repeat panendoscopy was performed with findings as described above.  All bleeding sites were fulgurated with the button electrode. Once all areas were treated the inflow was reduced to minimal and no other active bleeding sites were noted.  The resectoscope was removed and a 24 [place] Couvelaire three-way catheter was placed and 20 cc of sterile water was placed in the balloon. The catheter was irrigated with a To[place]y syringe and was crystal clear. CBI was placed on brisk inflow with crystal-clear effluent.  After anesthetic reversal the patient was transported to the PACU in stable condition.  Plan: CBI overnight titrating to keep effluent pink-tinged to clear.   Abbie Sons, M.D.

## 2019-10-23 DIAGNOSIS — R319 Hematuria, unspecified: Secondary | ICD-10-CM

## 2019-10-23 LAB — COMPREHENSIVE METABOLIC PANEL
ALT: 14 U/L (ref 0–44)
AST: 12 U/L — ABNORMAL LOW (ref 15–41)
Albumin: 1.9 g/dL — ABNORMAL LOW (ref 3.5–5.0)
Alkaline Phosphatase: 52 U/L (ref 38–126)
Anion gap: 7 (ref 5–15)
BUN: 52 mg/dL — ABNORMAL HIGH (ref 8–23)
CO2: 25 mmol/L (ref 22–32)
Calcium: 7.8 mg/dL — ABNORMAL LOW (ref 8.9–10.3)
Chloride: 113 mmol/L — ABNORMAL HIGH (ref 98–111)
Creatinine, Ser: 3.79 mg/dL — ABNORMAL HIGH (ref 0.61–1.24)
GFR calc Af Amer: 17 mL/min — ABNORMAL LOW (ref >60)
GFR calc non Af Amer: 15 mL/min — ABNORMAL LOW (ref >60)
Glucose, Bld: 129 mg/dL — ABNORMAL HIGH (ref 70–99)
Potassium: 4.7 mmol/L (ref 3.5–5.1)
Sodium: 145 mmol/L (ref 135–145)
Total Bilirubin: 0.5 mg/dL (ref 0.3–1.2)
Total Protein: 4.7 g/dL — ABNORMAL LOW (ref 6.5–8.1)

## 2019-10-23 LAB — GLUCOSE, CAPILLARY
Glucose-Capillary: 111 mg/dL — ABNORMAL HIGH (ref 70–99)
Glucose-Capillary: 128 mg/dL — ABNORMAL HIGH (ref 70–99)
Glucose-Capillary: 142 mg/dL — ABNORMAL HIGH (ref 70–99)
Glucose-Capillary: 167 mg/dL — ABNORMAL HIGH (ref 70–99)

## 2019-10-23 LAB — CBC
HCT: 24 % — ABNORMAL LOW (ref 39.0–52.0)
Hemoglobin: 7.8 g/dL — ABNORMAL LOW (ref 13.0–17.0)
MCH: 30.6 pg (ref 26.0–34.0)
MCHC: 32.5 g/dL (ref 30.0–36.0)
MCV: 94.1 fL (ref 80.0–100.0)
Platelets: 118 10*3/uL — ABNORMAL LOW (ref 150–400)
RBC: 2.55 MIL/uL — ABNORMAL LOW (ref 4.22–5.81)
RDW: 15.3 % (ref 11.5–15.5)
WBC: 6.7 10*3/uL (ref 4.0–10.5)
nRBC: 0 % (ref 0.0–0.2)

## 2019-10-23 LAB — PROTIME-INR
INR: 1.2 (ref 0.8–1.2)
Prothrombin Time: 14.6 seconds (ref 11.4–15.2)

## 2019-10-23 LAB — APTT: aPTT: 34 seconds (ref 24–36)

## 2019-10-23 MED ORDER — BOOST / RESOURCE BREEZE PO LIQD CUSTOM
1.0000 | Freq: Three times a day (TID) | ORAL | Status: DC
  Administered 2019-10-23 – 2019-10-24 (×5): 1 via ORAL

## 2019-10-23 MED ORDER — SODIUM CHLORIDE 0.9 % IR SOLN
3000.0000 mL | Status: DC
  Administered 2019-10-23 – 2019-10-24 (×3): 3000 mL

## 2019-10-23 MED ORDER — CHLORHEXIDINE GLUCONATE CLOTH 2 % EX PADS
6.0000 | MEDICATED_PAD | Freq: Every day | CUTANEOUS | Status: DC
  Administered 2019-10-23 – 2019-10-24 (×2): 6 via TOPICAL

## 2019-10-23 MED ORDER — BELLADONNA ALKALOIDS-OPIUM 16.2-60 MG RE SUPP: 1.0000 | Freq: Three times a day (TID) | RECTAL | Status: DC | PRN

## 2019-10-23 NOTE — Progress Notes (Signed)
Patient alert and oriented X4, able to verbalize needs and understanding. Patient arrived to unit approximately 1950 post-op from a cystocopy with fulgeration clot evaluation. Patient has IV site to RAC 20g, 3-way catheter to urethra for irrigation, and fistula to LUA.  Patient denies pain, shows no s/s of SOB.Patient aallergic to Percodan. Currently on room air with CBI. Patient currently on a clear liquid diet, AC\HS Cbg. SCD's in place while in bed. Urine flowing pink tinged with occassional small clots. Patient currently in bed with call bell in reach for assistance.

## 2019-10-23 NOTE — Progress Notes (Signed)
Initial Nutrition Assessment  DOCUMENTATION CODES:   Not applicable  INTERVENTION:   Boost Breeze po TID, each supplement provides 250 kcal and 9 grams of protein  MVI daily   NUTRITION DIAGNOSIS:   Increased nutrient needs related to cancer and cancer related treatments as evidenced by increased estimated needs.  GOAL:   Patient will meet greater than or equal to 90% of their needs  MONITOR:   PO intake, Supplement acceptance, Labs, Weight trends, Skin, I & O's  REASON FOR ASSESSMENT:   Malnutrition Screening Tool    ASSESSMENT:   79 y.o. male with PMH diabetes, HIV, substance abuse, hypertension, chronic anemia, stage IV CKD s/p AV fistula with baseline creatinine 3-4 and prostate cancer s/p radiation therapy admitted on 10/22/2019 with recurrent clot retention secondary to radiation cystitis now s/p cystoscopy with clot evacuation and bladder fulguration with Dr. Bernardo Heater 1/25   Patient familiar to nutrition department from recent previous admit. Pt reports poor appetite and oral intake for the past month. Pt eating 90% of meals prior to his last discharge on 1/19. Pt NPO this morning for procedure but has now been placed on a clear liquid diet. RD will add supplements to help pt meet his estimated needs. Will change to Ensure once diet advanced.    Per chart, pt is down 25lbs(11%) from his UBW. It is difficult to determine exactly when the weight loss occurred as pt's last 3 weights appear to be reported and not measured. Pt has only had one weight this admit as well which could be inaccurate.    Medications reviewed and include: insulin, melatonin, MVI, ciprofloxacin  Labs reviewed: BUN 52(H), creat 3.79(H) Hgb 7.8(L), Hct 24.0(L) cbgs- 127, 111, 128 x 24 hrs AIC 5.1- 1/25  NUTRITION - FOCUSED PHYSICAL EXAM:    Most Recent Value  Orbital Region  No depletion  Upper Arm Region  No depletion  Thoracic and Lumbar Region  No depletion  Buccal Region  No depletion   Temple Region  No depletion  Clavicle Bone Region  No depletion  Clavicle and Acromion Bone Region  No depletion  Scapular Bone Region  No depletion  Dorsal Hand  No depletion  Patellar Region  No depletion  Anterior Thigh Region  No depletion  Posterior Calf Region  No depletion  Edema (RD Assessment)  Mild  Hair  Reviewed  Eyes  Reviewed  Mouth  Reviewed  Skin  Reviewed  Nails  Reviewed     Diet Order:   Diet Order            Diet clear liquid Room service appropriate? Yes; Fluid consistency: Thin  Diet effective now             EDUCATION NEEDS:   Education needs have been addressed  Skin:  Skin Assessment: Reviewed RN Assessment(ecchymosis, MASD)  Last BM:  pta  Height:   Ht Readings from Last 1 Encounters:  10/22/19 5\' 9"  (1.753 m)    Weight:   Wt Readings from Last 1 Encounters:  10/22/19 92.3 kg    Ideal Body Weight:  72.7 kg  BMI:  Body mass index is 30.04 kg/m.  Estimated Nutritional Needs:   Kcal:  2100-2400kcal/day  Protein:  105-120g/day  Fluid:  >1.8L/day  Koleen Distance MS, RD, LDN Pager #- [phone] Office#- [phone] After Hours Pager: [phone]

## 2019-10-23 NOTE — Progress Notes (Signed)
Urology Inpatient Progress Note  Subjective: Darrell [name] is a 79 y.o. male with PMH diabetes, HIV, hypertension, chronic anemia, and stage IV CKD s/p AV fistula with baseline creatinine 3-4, and prostate cancer s/p radiation therapy admitted on 10/22/2019 with recurrent clot retention secondary to radiation cystitis.  He is POD1 from cystoscopy with clot evacuation and bladder fulguration with Dr. Bernardo Heater, on CBI.  Creatinine up today, 3.79.  Hemoglobin down today, 7.8.  CBI with pink output on slow drip.  <5 mL clot material visualized in bedside drainage bag, tubing without clot material.  Anti-infectives: Anti-infectives (From admission, onward)   Start     Dose/Rate Route Frequency Ordered Stop   10/23/19 1000  emtricitabine-rilpivir-tenofovir AF (ODEFSEY) 200-25-25 MG per tablet 1 tablet     1 tablet Oral Daily 10/22/19 1506     10/22/19 1530  ciprofloxacin (CIPRO) IVPB 400 mg     400 mg 200 mL/hr over 60 Minutes Intravenous Every 24 hours 10/22/19 1524        Current Facility-Administered Medications  Medication Dose Route Frequency Provider Last Rate Last Admin  . acetaminophen (TYLENOL) tablet 650 mg  650 mg Oral Q6H PRN Stoioff, Scott C, MD       Or  . acetaminophen (TYLENOL) suppository 650 mg  650 mg Rectal Q6H PRN Stoioff, Scott C, MD      . amLODipine (NORVASC) tablet 10 mg  10 mg Oral Daily Stoioff, Scott C, MD   10 mg at 10/23/19 0914  . atorvastatin (LIPITOR) tablet 20 mg  20 mg Oral Daily Stoioff, Scott C, MD   20 mg at 10/23/19 0911  . buPROPion (WELLBUTRIN XL) 24 hr tablet 150 mg  150 mg Oral Daily Stoioff, Scott C, MD   150 mg at 10/23/19 0911  . calcium carbonate (TUMS - dosed in mg elemental calcium) chewable tablet 500 mg  500 mg Oral Daily Stoioff, Scott C, MD   500 mg at 10/23/19 0911  . Chlorhexidine Gluconate Cloth 2 % PADS 6 each  6 each Topical Daily Fritzi Mandes, MD      . ciprofloxacin (CIPRO) IVPB 400 mg  400 mg Intravenous Q24H Stoioff, Scott C, MD 200  mL/hr at 10/22/19 1619 400 mg at 10/22/19 1619  . DULoxetine (CYMBALTA) DR capsule 60 mg  60 mg Oral Daily Stoioff, Scott C, MD   60 mg at 10/23/19 0913  . dutasteride (AVODART) capsule 0.5 mg  0.5 mg Oral Daily Stoioff, Scott C, MD   0.5 mg at 10/23/19 0912  . emtricitabine-rilpivir-tenofovir AF (ODEFSEY) 200-25-25 MG per tablet 1 tablet  1 tablet Oral Daily Stoioff, Scott C, MD      . fesoterodine (TOVIAZ) tablet 8 mg  8 mg Oral Daily Stoioff, Scott C, MD   8 mg at 10/23/19 0913  . insulin aspart (novoLOG) injection 0-15 Units  0-15 Units Subcutaneous TID WC Stoioff, Ronda Fairly, MD   2 Units at 10/22/19 1714  . insulin aspart (novoLOG) injection 0-5 Units  0-5 Units Subcutaneous QHS Stoioff, Scott C, MD      . ketotifen (ZADITOR) 0.025 % ophthalmic solution 1 drop  1 drop Both Eyes BID PRN Stoioff, Scott C, MD      . Melatonin TABS 10 mg  10 mg Oral QHS Stoioff, Scott C, MD      . mirabegron ER (MYRBETRIQ) tablet 25 mg  25 mg Oral Daily Stoioff, Scott C, MD   25 mg at 10/23/19 0912  . multivitamin with minerals tablet 1  tablet  1 tablet Oral Daily Stoioff, Ronda Fairly, MD   1 tablet at 10/23/19 0914  . omega-3 acid ethyl esters (LOVAZA) capsule 2 g  2 g Oral Daily Stoioff, Scott C, MD   2 g at 10/23/19 0912  . ondansetron (ZOFRAN) tablet 4 mg  4 mg Oral Q6H PRN Stoioff, Scott C, MD       Or  . ondansetron (ZOFRAN) injection 4 mg  4 mg Intravenous Q6H PRN Stoioff, Scott C, MD      . opium-belladonna (B&O SUPPRETTES) 16.2-60 MG suppository 1 suppository  1 suppository Rectal Q8H PRN Stoioff, Scott C, MD      . risperiDONE (RISPERDAL) tablet 1 mg  1 mg Oral QHS Stoioff, Scott C, MD      . sodium chloride flush (NS) 0.9 % injection 3 mL  3 mL Intravenous Q12H Stoioff, Scott C, MD   3 mL at 10/23/19 0917  . sodium chloride irrigation 0.9 % 3,000 mL  3,000 mL Irrigation Continuous Stoioff, Scott C, MD   3,000 mL at 10/23/19 0919  . tamsulosin (FLOMAX) capsule 0.4 mg  0.4 mg Oral Daily Stoioff, Scott C, MD    0.4 mg at 10/23/19 0913  . traZODone (DESYREL) tablet 300 mg  300 mg Oral QHS Stoioff, Scott C, MD       Objective: Vital signs in last 24 hours: Temp:  [97.4 F (36.3 C)-98.1 F (36.7 C)] 98.1 F (36.7 C) (01/26 0342) Pulse Rate:  [74-98] 74 (01/26 0916) Resp:  [15-20] 18 (01/26 0916) BP: (123-156)/(65-84) 130/65 (01/26 0916) SpO2:  [95 %-100 %] 100 % (01/26 0916) Weight:  [92.3 kg] 92.3 kg (01/25 2030)  Intake/Output from previous day: 01/25 0701 - 01/26 0700 In: 13300 [I.V.:700] Out: 8400 [Urine:8400] Intake/Output this shift: Total I/O In: -  Out: 400 [Urine:400]  Physical Exam Vitals and nursing note reviewed.  Constitutional:      General: He is not in acute distress.    Appearance: He is not ill-appearing, toxic-appearing or diaphoretic.  HENT:     Head: Normocephalic and atraumatic.  Pulmonary:     Effort: Pulmonary effort is normal. No respiratory distress.  Abdominal:     General: There is no distension.     Tenderness: There is no abdominal tenderness. There is no guarding or rebound.  Skin:    General: Skin is warm and dry.  Neurological:     Mental Status: He is alert and oriented to person, place, and time.  Psychiatric:        Mood and Affect: Mood normal.        Behavior: Behavior normal.    Lab Results:  Recent Labs    10/22/19 1336 10/23/19 0458  WBC 9.6 6.7  HGB 9.3* 7.8*  HCT 28.0* 24.0*  PLT 124* 118*   BMET Recent Labs    10/22/19 1058 10/23/19 0458  NA 141 145  K 4.5 4.7  CL 112* 113*  CO2 19* 25  GLUCOSE 145* 129*  BUN 50* 52*  CREATININE 3.43* 3.79*  CALCIUM 7.9* 7.8*   PT/INR Recent Labs    10/23/19 0458  LABPROT 14.6  INR 1.2   Assessment & Plan: 79 year old comorbid male admitted with recurrent clot retention secondary to radiation cystitis, now s/p cystoscopy with clot evacuation and bladder fulguration with Dr. Bernardo Heater.  I emptied his bedside drainage bag of 2600 mL pink output this morning.  Catheter tubing  with light pink output that immediately cleared with fast flow CBI.  I ultimately slowed CBI to a slow drip to maintain light pink urinary output.  Continue daily labs and titrate CBI drip to maintain light pink or clear urinary output.  Will recheck in the morning.  Debroah Loop, PA-C 10/23/2019

## 2019-10-23 NOTE — Progress Notes (Signed)
PROGRESS NOTE    Darrell [name]  NOM:[id number] DOB: 04/28/1941 DOA: 10/22/2019 PCP: Cher Nakai, MD      Brief Narrative:  Darrell [name] is a 79 y.o. M with CKD 4 with AV fistula in place, DM, HTN, HIV, hx prostate cancer s/p Radx, non-malignant bladder tumor recent resection, persistent hematuria, recent pseudomonal UTI who presented with gross hematuria and suprapubic pain.  In the ER, his catheter was changed out and URology were consutled who took the patient for cystoscopy and fulguration and started CBI.      Assessment & Plan:  Gross hematuria due to radiation cystitis suspected S/p cystoscopy with fulguration 1/25 by Dr. Bernardo Heater This seems better  -Consult urology, appreciate recommendations -Continue CBI -Continue Flomax, dutasteride -Mirabegron and fesoterodine prescriptions are deferred to urology   Possible UTI -Continue ciprofloxacin -Obtain urine culture  Diabetes Glucoses good -Hold 70/30 -Continue SS correction insulin -Atorvastatin, continue  Anemia Recent baseline hemoglobin around 9.  hemoglobin down to 7.8 today. -Hemoglobin threshold for transfusion 7 g/dL   Chronic kidney disease stage IV Baseline creatinine low threes, CKD stage IV.  Creatinine is slightly up today. -Trend BMP  HIV -Continue emtricitabine, rilpivir, tenofovir AF -Check CD4 count  Schizophrenia Disorder -Continue duloxetine, bupropion, risperidone  Hypertension BP normal -Continue amlodipine     Disposition: The patient was admitted with gross hematuria.  Patient impression requires ongoing continuous bladder irrigation, close monitoring of hemoglobin and creatinine. I will discharge when urology feel it is safe to cease continuous bladder irrigation.        MDM: The below labs and imaging reports were reviewed and summarized above.  Medication management as above.    DVT prophylaxis: SCDs Code Status: FULL Family Communication: Partner by phone,  attempted call, no answer    Consultants:   Urology  Procedures:   1/25 cystoscopy  Antimicrobials:   Ciprofloxacin 1/25 >>   Culture data:   None obtained           Subjective: Patient is feeling tired.  No chest pain, fever, dyspnea.  Still with some pink blood in the urine bag.  Objective: Vitals:   10/23/19 0342 10/23/19 0914 10/23/19 0916 10/23/19 1206  BP: 126/68 130/65 130/65 132/72  Pulse: 84  74 76  Resp: 18  18 16   Temp: 98.1 F (36.7 C)   98.2 F (36.8 C)  TempSrc: Oral   Oral  SpO2: 100%  100% 99%  Weight:      Height:        Intake/Output Summary (Last 24 hours) at 10/23/2019 1611 Last data filed at 10/23/2019 0900 Gross per 24 hour  Intake 13300 ml  Output 6900 ml  Net 6400 ml   Filed Weights   10/22/19 2030  Weight: 92.3 kg    Examination: General appearance: Thin adult male, alert and in no acute distress.   HEENT: Anicteric, conjunctiva pink, lids and lashes normal. No nasal deformity, discharge, epistaxis.  Lips moist, tongue dry, edentulous.   Skin: Warm and dry.  Pale, no jaundice.  No suspicious rashes or lesions. Cardiac: RRR, nl S1-S2, no murmurs appreciated.  Capillary refill is brisk.  JVP normal.  No LE edema.  Radial pulses 2+ and symmetric. Respiratory: Normal respiratory rate and rhythm.  CTAB without rales or wheezes. Abdomen: Abdomen soft.  No TTP or guarding. No ascites, distension, hepatosplenomegaly.   MSK: No deformities or effusions. Neuro: Awake and alert.  EOMI, moves all extremities. Speech fluent.    Psych: Sensorium intact and  responding to questions, attention normal. Affect normal.  Judgment and insight appear normal.    Data Reviewed: I have personally reviewed following labs and imaging studies:  CBC: Recent Labs  Lab 10/22/19 1014 10/22/19 1336 10/23/19 0458  WBC 7.9 9.6 6.7  NEUTROABS 5.5  --   --   HGB 9.4* 9.3* 7.8*  HCT 28.7* 28.0* 24.0*  MCV 94.1 92.7 94.1  PLT 118* 124* 118*   Basic  Metabolic Panel: Recent Labs  Lab 10/22/19 1058 10/23/19 0458  NA 141 145  K 4.5 4.7  CL 112* 113*  CO2 19* 25  GLUCOSE 145* 129*  BUN 50* 52*  CREATININE 3.43* 3.79*  CALCIUM 7.9* 7.8*   GFR: Estimated Creatinine Clearance: 18.6 mL/min (A) (by C-G formula based on SCr of 3.79 mg/dL (H)). Liver Function Tests: Recent Labs  Lab 10/23/19 0458  AST 12*  ALT 14  ALKPHOS 52  BILITOT 0.5  PROT 4.7*  ALBUMIN 1.9*   No results for input(s): LIPASE, AMYLASE in the last 168 hours. No results for input(s): AMMONIA in the last 168 hours. Coagulation Profile: Recent Labs  Lab 10/23/19 0458  INR 1.2   Cardiac Enzymes: No results for input(s): CKTOTAL, CKMB, CKMBINDEX, TROPONINI in the last 168 hours. BNP (last 3 results) No results for input(s): PROBNP in the last 8760 hours. HbA1C: Recent Labs    10/22/19 1611  HGBA1C 5.1   CBG: Recent Labs  Lab 10/22/19 1616 10/22/19 1931 10/23/19 0802 10/23/19 1124  GLUCAP 127* 127* 111* 128*   Lipid Profile: No results for input(s): CHOL, HDL, LDLCALC, TRIG, CHOLHDL, LDLDIRECT in the last 72 hours. Thyroid Function Tests: No results for input(s): TSH, T4TOTAL, FREET4, T3FREE, THYROIDAB in the last 72 hours. Anemia Panel: No results for input(s): VITAMINB12, FOLATE, FERRITIN, TIBC, IRON, RETICCTPCT in the last 72 hours. Urine analysis:    Component Value Date/Time   COLORURINE RED (A) 10/22/2019 1336   APPEARANCEUR HAZY (A) 10/22/2019 1336   LABSPEC 1.005 10/22/2019 1336   PHURINE  10/22/2019 1336    TEST NOT REPORTED DUE TO COLOR INTERFERENCE OF URINE PIGMENT   GLUCOSEU (A) 10/22/2019 1336    TEST NOT REPORTED DUE TO COLOR INTERFERENCE OF URINE PIGMENT   HGBUR (A) 10/22/2019 1336    TEST NOT REPORTED DUE TO COLOR INTERFERENCE OF URINE PIGMENT   BILIRUBINUR (A) 10/22/2019 1336    TEST NOT REPORTED DUE TO COLOR INTERFERENCE OF URINE PIGMENT   KETONESUR (A) 10/22/2019 1336    TEST NOT REPORTED DUE TO COLOR INTERFERENCE OF  URINE PIGMENT   PROTEINUR (A) 10/22/2019 1336    TEST NOT REPORTED DUE TO COLOR INTERFERENCE OF URINE PIGMENT   NITRITE (A) 10/22/2019 1336    TEST NOT REPORTED DUE TO COLOR INTERFERENCE OF URINE PIGMENT   LEUKOCYTESUR (A) 10/22/2019 1336    TEST NOT REPORTED DUE TO COLOR INTERFERENCE OF URINE PIGMENT   Sepsis Labs: @LABRCNTIP (procalcitonin:4,lacticacidven:4)  ) Recent Results (from the past 240 hour(s))  SARS CORONAVIRUS 2 (TAT 6-24 HRS) Nasopharyngeal Nasopharyngeal Swab     Status: None   Collection Time: 10/15/19  2:02 PM   Specimen: Nasopharyngeal Swab  Result Value Ref Range Status   SARS Coronavirus 2 NEGATIVE NEGATIVE Final    Comment: (NOTE) SARS-CoV-2 target nucleic acids are NOT DETECTED. The SARS-CoV-2 RNA is generally detectable in upper and lower respiratory specimens during the acute phase of infection. Negative results do not preclude SARS-CoV-2 infection, do not rule out co-infections with other pathogens, and should  not be used as the sole basis for treatment or other patient management decisions. Negative results must be combined with clinical observations, patient history, and epidemiological information. The expected result is Negative. Fact Sheet for Patients: SugarRoll.be Fact Sheet for Healthcare Providers: https://www.woods-mathews.com/ This test is not yet approved or cleared by the [place] FDA and  has been authorized for detection and/or diagnosis of SARS-CoV-2 by FDA under an Emergency Use Authorization (EUA). This EUA will remain  in effect (meaning this test can be used) for the duration of the COVID-19 declaration under Section 56 4(b)(1) of the Act, 21 U.S.C. section 360bbb-3(b)(1), unless the authorization is terminated or revoked sooner. Performed at [facility], [place] [address] [place], [place] [address]   Respiratory Panel by RT PCR (Flu A&B, Covid) - Nasopharyngeal Swab     Status:  None   Collection Time: 10/22/19  2:50 PM   Specimen: Nasopharyngeal Swab  Result Value Ref Range Status   SARS Coronavirus 2 by RT PCR NEGATIVE NEGATIVE Final    Comment: (NOTE) SARS-CoV-2 target nucleic acids are NOT DETECTED. The SARS-CoV-2 RNA is generally detectable in upper respiratoy specimens during the acute phase of infection. The lowest concentration of SARS-CoV-2 viral copies this assay can detect is 131 copies/mL. A negative result does not preclude SARS-Cov-2 infection and should not be used as the sole basis for treatment or other patient management decisions. A negative result may occur with  improper specimen collection/handling, submission of specimen other than nasopharyngeal swab, presence of viral mutation(s) within the areas targeted by this assay, and inadequate number of viral copies (<131 copies/mL). A negative result must be combined with clinical observations, patient history, and epidemiological information. The expected result is Negative. Fact Sheet for Patients:  PinkCheek.be Fact Sheet for Healthcare Providers:  GravelBags.it This test is not yet ap proved or cleared by the [place] FDA and  has been authorized for detection and/or diagnosis of SARS-CoV-2 by FDA under an Emergency Use Authorization (EUA). This EUA will remain  in effect (meaning this test can be used) for the duration of the COVID-19 declaration under Section 564(b)(1) of the Act, 21 U.S.C. section 360bbb-3(b)(1), unless the authorization is terminated or revoked sooner.    Influenza A by PCR NEGATIVE NEGATIVE Final   Influenza B by PCR NEGATIVE NEGATIVE Final    Comment: (NOTE) The Xpert Xpress SARS-CoV-2/FLU/RSV assay is intended as an aid in  the diagnosis of influenza from Nasopharyngeal swab specimens and  should not be used as a sole basis for treatment. Nasal washings and  aspirates are unacceptable for Xpert  Xpress SARS-CoV-2/FLU/RSV  testing. Fact Sheet for Patients: PinkCheek.be Fact Sheet for Healthcare Providers: GravelBags.it This test is not yet approved or cleared by the [place] FDA and  has been authorized for detection and/or diagnosis of SARS-CoV-2 by  FDA under an Emergency Use Authorization (EUA). This EUA will remain  in effect (meaning this test can be used) for the duration of the  Covid-19 declaration under Section 564(b)(1) of the Act, 21  U.S.C. section 360bbb-3(b)(1), unless the authorization is  terminated or revoked. Performed at [facility], [address] [place], [place] [address]          Radiology Studies: DG OR UROLOGY CYSTO IMAGE ([place])  Result Date: 10/22/2019 There is no interpretation for this exam.  This order is for images obtained during a surgical procedure.  Please See "Surgeries" Tab for more information regarding the procedure.  Scheduled Meds: . amLODipine  10 mg Oral Daily  . atorvastatin  20 mg Oral Daily  . buPROPion  150 mg Oral Daily  . calcium carbonate  500 mg Oral Daily  . Chlorhexidine Gluconate Cloth  6 each Topical Daily  . DULoxetine  60 mg Oral Daily  . dutasteride  0.5 mg Oral Daily  . emtricitabine-rilpivir-tenofovir AF  1 tablet Oral Daily  . feeding supplement  1 Container Oral TID BM  . fesoterodine  8 mg Oral Daily  . insulin aspart  0-15 Units Subcutaneous TID WC  . insulin aspart  0-5 Units Subcutaneous QHS  . Melatonin  10 mg Oral QHS  . mirabegron ER  25 mg Oral Daily  . multivitamin with minerals  1 tablet Oral Daily  . omega-3 acid ethyl esters  2 g Oral Daily  . risperiDONE  1 mg Oral QHS  . sodium chloride flush  3 mL Intravenous Q12H  . tamsulosin  0.4 mg Oral Daily  . trazodone  300 mg Oral QHS   Continuous Infusions: . ciprofloxacin 400 mg (10/22/19 1619)  . sodium chloride irrigation       LOS: 1 day     Time spent: 25 minutes    Edwin Dada, MD [facility] 10/23/2019, 4:11 PM     Please page though [place] or Epic secure chat:  For [employer], Adult nurse

## 2019-10-24 LAB — BASIC METABOLIC PANEL
Anion gap: 5 (ref 5–15)
BUN: 55 mg/dL — ABNORMAL HIGH (ref 8–23)
CO2: 24 mmol/L (ref 22–32)
Calcium: 7.6 mg/dL — ABNORMAL LOW (ref 8.9–10.3)
Chloride: 112 mmol/L — ABNORMAL HIGH (ref 98–111)
Creatinine, Ser: 3.83 mg/dL — ABNORMAL HIGH (ref 0.61–1.24)
GFR calc Af Amer: 17 mL/min — ABNORMAL LOW (ref >60)
GFR calc non Af Amer: 14 mL/min — ABNORMAL LOW (ref >60)
Glucose, Bld: 102 mg/dL — ABNORMAL HIGH (ref 70–99)
Potassium: 4.6 mmol/L (ref 3.5–5.1)
Sodium: 141 mmol/L (ref 135–145)

## 2019-10-24 LAB — CBC
HCT: 20.5 % — ABNORMAL LOW (ref 39.0–52.0)
Hemoglobin: 6.7 g/dL — ABNORMAL LOW (ref 13.0–17.0)
MCH: 30.7 pg (ref 26.0–34.0)
MCHC: 32.7 g/dL (ref 30.0–36.0)
MCV: 94 fL (ref 80.0–100.0)
Platelets: 112 10*3/uL — ABNORMAL LOW (ref 150–400)
RBC: 2.18 MIL/uL — ABNORMAL LOW (ref 4.22–5.81)
RDW: 15.5 % (ref 11.5–15.5)
WBC: 5.8 10*3/uL (ref 4.0–10.5)
nRBC: 0 % (ref 0.0–0.2)

## 2019-10-24 LAB — GLUCOSE, CAPILLARY
Glucose-Capillary: 96 mg/dL (ref 70–99)
Glucose-Capillary: 201 mg/dL — ABNORMAL HIGH (ref 70–99)
Glucose-Capillary: 124 mg/dL — ABNORMAL HIGH (ref 70–99)
Glucose-Capillary: 101 mg/dL — ABNORMAL HIGH (ref 70–99)

## 2019-10-24 LAB — HEMOGLOBIN AND HEMATOCRIT, BLOOD
HCT: 25.8 % — ABNORMAL LOW (ref 39.0–52.0)
Hemoglobin: 8.6 g/dL — ABNORMAL LOW (ref 13.0–17.0)

## 2019-10-24 LAB — URINE CULTURE

## 2019-10-24 LAB — ABO/RH: ABO/RH(D): O POS

## 2019-10-24 LAB — PREPARE RBC (CROSSMATCH)

## 2019-10-24 MED ORDER — SODIUM CHLORIDE 0.9 % IR SOLN: 3000.0000 mL | Status: DC

## 2019-10-24 MED ORDER — SODIUM CHLORIDE 0.9 % IV SOLN: INTRAVENOUS | Status: DC

## 2019-10-24 MED ORDER — SODIUM CHLORIDE 0.9% IV SOLUTION: Freq: Once | INTRAVENOUS | Status: AC

## 2019-10-24 NOTE — Progress Notes (Signed)
PROGRESS NOTE    Darrell [name]  QQP:[id number] DOB: Mar 21, 1941 DOA: 10/22/2019 PCP: Cher Nakai, MD      Brief Narrative:  Darrell [name] is a 79 y.o. M with CKD 4 with AV fistula in place, DM, HTN, HIV, hx prostate cancer s/p Radx, non-malignant bladder tumor recent resection, persistent hematuria, recent pseudomonal UTI who presented with gross hematuria and suprapubic pain.  In the ER, his catheter was changed out and URology were consutled who took the patient for cystoscopy and fulguration and started CBI.        Assessment & Plan:  Gross hematuria due to radiation cystitis suspected S/p cystoscopy with fulguration 1/25 by Dr. Bernardo Heater Urology feel this is improving, CBI stopped.  -Strict I/Os -Consult urology, appreciate recommendations -Continue Flomax, dutasteride -Mirabegron and fesoterodine prescriptions are deferred to urology   Possible UTI UCx 1/15 insig growth, repeat from 1/26 pending. WBC normal, no fever. -Continue cipro, day 3  Diabetes Glucoses controlled -Hold 70/30 -Continue SS correction insulin as needed -Continue atorvastatin  Anemia Recent baseline hemoglobin around 9.  hemoglobin down to 6.7 g/dL today.  Transfusion ordered overnight, crossmatched unit pending -Transfuse 1 unit   Chronic kidney disease stage IV Baseline creatinine low threes, CKD stage IV.  Creatinine still trending up due to anemia. -Trend BMP  HIV -Continue emtricitabine, rilpivir, tenofovir AF -Follow CD4 count  Schizophrenia Mood Disorder -Continue duloxetine, bupropion, risperidone  Hypertension BP controlled -Continue amlodipine     Disposition: The patient was admitted with gross hematuria.  We will discontinue CBI today and I will discharge when urology and I feel his urine output is not impeded off CBI and when his Hgb has been treated appropriately        MDM: The below labs and imaging reports reviewed and summarized above.  Medication  management as above.     DVT prophylaxis: SCDs Code Status: FULL Family Communication: Attempted call to partner Darrell [name]    Consultants:   Urology  Procedures:   1/25 cystoscopy  Antimicrobials:   Ciprofloxacin 1/25 >>   Culture data:   1/26 urine culture pending           Subjective: Feels relatively tired.  Hematuria is improving.  No abdominal pain.  He has some rib pain from feeling sore from moving around.  No chest pain, fever, dyspnea, cough.        Objective: Vitals:   10/23/19 1206 10/24/19 0541 10/24/19 1057 10/24/19 1137  BP: 132/72 (!) 125/59 136/73 134/66  Pulse: 76 73 75 74  Resp: 16 18 16 14   Temp: 98.2 F (36.8 C) 98.4 F (36.9 C) 98.3 F (36.8 C) 98.3 F (36.8 C)  TempSrc: Oral Oral Oral Oral  SpO2: 99% 100% 100% 99%  Weight:      Height:        Intake/Output Summary (Last 24 hours) at 10/24/2019 1153 Last data filed at 10/24/2019 7124 Gross per 24 hour  Intake 6641.44 ml  Output 8450 ml  Net -1808.56 ml   Filed Weights   10/22/19 2030  Weight: 92.3 kg    Examination: General appearance: elderly adult male, alert and in no acute distress.  Appears tired, lying in bed, watching TV HEENT: Anicteric, conjunctiva pink, lids and lashes normal. No nasal deformity, discharge, epistaxis.  Lips moist, teeth normal. OP normal, no oral lesions.   Skin: Warm and dry.  No suspicious rashes or lesions. Cardiac: RRR, no murmurs appreciated.  No LE edema.    Respiratory: Normal respiratory  rate and rhythm.  CTAB without rales or wheezes. Abdomen: Abdomen soft.  No TTP.  Mild rib pain. No ascites, distension, hepatosplenomegaly.   MSK: No deformities or effusions of the large joints of the upper or lower extremities bilaterally. Neuro: Awake and alert. Naming is grossly intact, and the patient's recall, recent and remote, as well as general fund of knowledge seem within normal limits.  Muscle tone normal, without fasciculations.  Moves all  extremities equally but with severe generalized weaknes.  Speech fluent.    Psych: Sensorium intact and responding to questions, attention normal. Affect blunted.  Judgment and insight appear normal.      Data Reviewed: I have personally reviewed following labs and imaging studies:  CBC: Recent Labs  Lab 10/22/19 1014 10/22/19 1336 10/23/19 0458 10/24/19 0428  WBC 7.9 9.6 6.7 5.8  NEUTROABS 5.5  --   --   --   HGB 9.4* 9.3* 7.8* 6.7*  HCT 28.7* 28.0* 24.0* 20.5*  MCV 94.1 92.7 94.1 94.0  PLT 118* 124* 118* 841*   Basic Metabolic Panel: Recent Labs  Lab 10/22/19 1058 10/23/19 0458 10/24/19 0428  NA 141 145 141  K 4.5 4.7 4.6  CL 112* 113* 112*  CO2 19* 25 24  GLUCOSE 145* 129* 102*  BUN 50* 52* 55*  CREATININE 3.43* 3.79* 3.83*  CALCIUM 7.9* 7.8* 7.6*   GFR: Estimated Creatinine Clearance: 18.4 mL/min (A) (by C-G formula based on SCr of 3.83 mg/dL (H)). Liver Function Tests: Recent Labs  Lab 10/23/19 0458  AST 12*  ALT 14  ALKPHOS 52  BILITOT 0.5  PROT 4.7*  ALBUMIN 1.9*   No results for input(s): LIPASE, AMYLASE in the last 168 hours. No results for input(s): AMMONIA in the last 168 hours. Coagulation Profile: Recent Labs  Lab 10/23/19 0458  INR 1.2   Cardiac Enzymes: No results for input(s): CKTOTAL, CKMB, CKMBINDEX, TROPONINI in the last 168 hours. BNP (last 3 results) No results for input(s): PROBNP in the last 8760 hours. HbA1C: Recent Labs    10/22/19 1611  HGBA1C 5.1   CBG: Recent Labs  Lab 10/23/19 0802 10/23/19 1124 10/23/19 1631 10/23/19 2128 10/24/19 0733  GLUCAP 111* 128* 142* 167* 96   Lipid Profile: No results for input(s): CHOL, HDL, LDLCALC, TRIG, CHOLHDL, LDLDIRECT in the last 72 hours. Thyroid Function Tests: No results for input(s): TSH, T4TOTAL, FREET4, T3FREE, THYROIDAB in the last 72 hours. Anemia Panel: No results for input(s): VITAMINB12, FOLATE, FERRITIN, TIBC, IRON, RETICCTPCT in the last 72 hours. Urine  analysis:    Component Value Date/Time   COLORURINE RED (A) 10/22/2019 1336   APPEARANCEUR HAZY (A) 10/22/2019 1336   LABSPEC 1.005 10/22/2019 1336   PHURINE  10/22/2019 1336    TEST NOT REPORTED DUE TO COLOR INTERFERENCE OF URINE PIGMENT   GLUCOSEU (A) 10/22/2019 1336    TEST NOT REPORTED DUE TO COLOR INTERFERENCE OF URINE PIGMENT   HGBUR (A) 10/22/2019 1336    TEST NOT REPORTED DUE TO COLOR INTERFERENCE OF URINE PIGMENT   BILIRUBINUR (A) 10/22/2019 1336    TEST NOT REPORTED DUE TO COLOR INTERFERENCE OF URINE PIGMENT   KETONESUR (A) 10/22/2019 1336    TEST NOT REPORTED DUE TO COLOR INTERFERENCE OF URINE PIGMENT   PROTEINUR (A) 10/22/2019 1336    TEST NOT REPORTED DUE TO COLOR INTERFERENCE OF URINE PIGMENT   NITRITE (A) 10/22/2019 1336    TEST NOT REPORTED DUE TO COLOR INTERFERENCE OF URINE PIGMENT   LEUKOCYTESUR (A) 10/22/2019  1336    TEST NOT REPORTED DUE TO COLOR INTERFERENCE OF URINE PIGMENT   Sepsis Labs: @LABRCNTIP (procalcitonin:4,lacticacidven:4)  ) Recent Results (from the past 240 hour(s))  SARS CORONAVIRUS 2 (TAT 6-24 HRS) Nasopharyngeal Nasopharyngeal Swab     Status: None   Collection Time: 10/15/19  2:02 PM   Specimen: Nasopharyngeal Swab  Result Value Ref Range Status   SARS Coronavirus 2 NEGATIVE NEGATIVE Final    Comment: (NOTE) SARS-CoV-2 target nucleic acids are NOT DETECTED. The SARS-CoV-2 RNA is generally detectable in upper and lower respiratory specimens during the acute phase of infection. Negative results do not preclude SARS-CoV-2 infection, do not rule out co-infections with other pathogens, and should not be used as the sole basis for treatment or other patient management decisions. Negative results must be combined with clinical observations, patient history, and epidemiological information. The expected result is Negative. Fact Sheet for Patients: SugarRoll.be Fact Sheet for Healthcare  Providers: https://www.woods-mathews.com/ This test is not yet approved or cleared by the [place] FDA and  has been authorized for detection and/or diagnosis of SARS-CoV-2 by FDA under an Emergency Use Authorization (EUA). This EUA will remain  in effect (meaning this test can be used) for the duration of the COVID-19 declaration under Section 56 4(b)(1) of the Act, 21 U.S.C. section 360bbb-3(b)(1), unless the authorization is terminated or revoked sooner. Performed at [facility], [place] [address] [place], [place] [address]   Respiratory Panel by RT PCR (Flu A&B, Covid) - Nasopharyngeal Swab     Status: None   Collection Time: 10/22/19  2:50 PM   Specimen: Nasopharyngeal Swab  Result Value Ref Range Status   SARS Coronavirus 2 by RT PCR NEGATIVE NEGATIVE Final    Comment: (NOTE) SARS-CoV-2 target nucleic acids are NOT DETECTED. The SARS-CoV-2 RNA is generally detectable in upper respiratoy specimens during the acute phase of infection. The lowest concentration of SARS-CoV-2 viral copies this assay can detect is 131 copies/mL. A negative result does not preclude SARS-Cov-2 infection and should not be used as the sole basis for treatment or other patient management decisions. A negative result may occur with  improper specimen collection/handling, submission of specimen other than nasopharyngeal swab, presence of viral mutation(s) within the areas targeted by this assay, and inadequate number of viral copies (<131 copies/mL). A negative result must be combined with clinical observations, patient history, and epidemiological information. The expected result is Negative. Fact Sheet for Patients:  PinkCheek.be Fact Sheet for Healthcare Providers:  GravelBags.it This test is not yet ap proved or cleared by the [place] FDA and  has been authorized for detection and/or diagnosis of SARS-CoV-2 by FDA  under an Emergency Use Authorization (EUA). This EUA will remain  in effect (meaning this test can be used) for the duration of the COVID-19 declaration under Section 564(b)(1) of the Act, 21 U.S.C. section 360bbb-3(b)(1), unless the authorization is terminated or revoked sooner.    Influenza A by PCR NEGATIVE NEGATIVE Final   Influenza B by PCR NEGATIVE NEGATIVE Final    Comment: (NOTE) The Xpert Xpress SARS-CoV-2/FLU/RSV assay is intended as an aid in  the diagnosis of influenza from Nasopharyngeal swab specimens and  should not be used as a sole basis for treatment. Nasal washings and  aspirates are unacceptable for Xpert Xpress SARS-CoV-2/FLU/RSV  testing. Fact Sheet for Patients: PinkCheek.be Fact Sheet for Healthcare Providers: GravelBags.it This test is not yet approved or cleared by the [place] and  has been authorized for  detection and/or diagnosis of SARS-CoV-2 by  FDA under an Emergency Use Authorization (EUA). This EUA will remain  in effect (meaning this test can be used) for the duration of the  Covid-19 declaration under Section 564(b)(1) of the Act, 21  U.S.C. section 360bbb-3(b)(1), unless the authorization is  terminated or revoked. Performed at [facility], [address] [place], [place] [address]          Radiology Studies: DG OR UROLOGY CYSTO IMAGE ([place])  Result Date: 10/22/2019 There is no interpretation for this exam.  This order is for images obtained during a surgical procedure.  Please See "Surgeries" Tab for more information regarding the procedure.        Scheduled Meds: . amLODipine  10 mg Oral Daily  . atorvastatin  20 mg Oral Daily  . buPROPion  150 mg Oral Daily  . calcium carbonate  500 mg Oral Daily  . Chlorhexidine Gluconate Cloth  6 each Topical Daily  . DULoxetine  60 mg Oral Daily  . dutasteride  0.5 mg Oral Daily  .  emtricitabine-rilpivir-tenofovir AF  1 tablet Oral Daily  . feeding supplement  1 Container Oral TID BM  . fesoterodine  8 mg Oral Daily  . insulin aspart  0-15 Units Subcutaneous TID WC  . insulin aspart  0-5 Units Subcutaneous QHS  . Melatonin  10 mg Oral QHS  . mirabegron ER  25 mg Oral Daily  . multivitamin with minerals  1 tablet Oral Daily  . omega-3 acid ethyl esters  2 g Oral Daily  . risperiDONE  1 mg Oral QHS  . sodium chloride flush  3 mL Intravenous Q12H  . tamsulosin  0.4 mg Oral Daily  . trazodone  300 mg Oral QHS   Continuous Infusions: . sodium chloride 75 mL/hr at 10/24/19 0743  . ciprofloxacin 400 mg (10/23/19 1756)  . sodium chloride irrigation       LOS: 2 days    Time spent: 25 minutes    Edwin Dada, MD [facility] 10/24/2019, 11:53 AM     Please page though [place] or Epic secure chat:  For [employer], Adult nurse

## 2019-10-24 NOTE — Anesthesia Postprocedure Evaluation (Signed)
Anesthesia Post Note  Patient: Darrell [name]  Procedure(s) Performed: CYSTOSCOPY WITH [place] (N/A )  Patient location during evaluation: PACU Anesthesia Type: General Level of consciousness: awake and alert and oriented Pain management: pain level controlled Vital Signs Assessment: post-procedure vital signs reviewed and stable Respiratory status: spontaneous breathing Cardiovascular status: blood pressure returned to baseline Anesthetic complications: no     Last Vitals:  Vitals:   10/24/19 1057 10/24/19 1137  BP: 136/73 134/66  Pulse: 75 74  Resp: 16 14  Temp: 36.8 C 36.8 C  SpO2: 100% 99%    Last Pain:  Vitals:   10/24/19 1203  TempSrc:   PainSc: 3                  Fuquan Wilson

## 2019-10-24 NOTE — Progress Notes (Signed)
PT Cancellation Note  Patient Details Name: Darrell [name] MRN: [id number] DOB: 1941-07-18   Cancelled Treatment:    Reason Eval/Treat Not Completed: Other (comment). Chart reviewed. Pt currently with low Hgb (6.7) and pending transfusion this date. Of note, pt also on continuous bladder irrigation, will need to be removed prior to physical therapy initiation. Will hold this date and re-attempt tomorrow.   Mikalyn Hermida 10/24/2019, 8:49 AM  Greggory Stallion, PT, DPT [phone]

## 2019-10-24 NOTE — NC FL2 (Signed)
[place] LEVEL OF CARE SCREENING TOOL     IDENTIFICATION  Patient Name: Darrell [name] Birthdate: [birth year] Sex: male Admission Date (Current Location): 10/22/2019  [facility] and [place] Number:  Engineering geologist and Address:         Provider Number: [id number]  Attending Physician Name and Address:  Edwin Dada, *  Relative Name and Phone Number:       Current Level of Care: Hospital Recommended Level of Care: [place] Prior Approval Number:    Date Approved/Denied:   PASRR Number:    Discharge Plan: SNF    Current Diagnoses: Patient Active Problem List   Diagnosis Date Noted  . Diabetes mellitus without complication ([place])   . HIV infection ([place])   . Hyperlipidemia   . Anemia   . Schizophrenia ([place])   . CKD (chronic kidney disease) stage 5, GFR less than 15 ml/min (HCC)   . Chronic radiation cystitis   . Hematuria 10/10/2019  . CKD (chronic kidney disease) stage 4, GFR 15-29 ml/min (HCC) 10/10/2019  . DM (diabetes mellitus), type 2 with renal complications ([place]) 61/95/0932  . Acute blood loss anemia 10/10/2019  . Essential hypertension 10/10/2019  . Thrombocytopenia ([place]) 10/10/2019  . Polyp in nasopharynx 12/22/2016  . Memory loss 12/08/2016  . Numbness and tingling of both feet 11/17/2016  . Unsteadiness 11/17/2016  . Facet arthropathy, lumbosacral 06/30/2016    Orientation RESPIRATION BLADDER Height & Weight     Self, Time, Place  Normal Incontinent Weight: 92.3 kg Height:  5\' 9"  (175.3 cm)  BEHAVIORAL SYMPTOMS/MOOD NEUROLOGICAL BOWEL NUTRITION STATUS      Incontinent Diet(Carb modified)  AMBULATORY STATUS COMMUNICATION OF NEEDS Skin   Extensive Assist Verbally Skin abrasions, Bruising                       Personal Care Assistance Level of Assistance              Functional Limitations Info    Sight Info: Adequate Hearing Info: Impaired      SPECIAL CARE FACTORS FREQUENCY  PT (By  licensed PT), OT (By licensed OT)                    Contractures Contractures Info: Not present    Additional Factors Info  Code Status Code Status Info: DNR Allergies Info: Percodan           Current Medications (10/24/2019):  This is the current hospital active medication list Current Facility-Administered Medications  Medication Dose Route Frequency Provider Last Rate Last Admin  . 0.9 %  sodium chloride infusion   Intravenous Continuous Lang Snow, NP 75 mL/hr at 10/24/19 0743 New Bag at 10/24/19 0743  . acetaminophen (TYLENOL) tablet 650 mg  650 mg Oral Q6H PRN Stoioff, Scott C, MD   650 mg at 10/24/19 1104   Or  . acetaminophen (TYLENOL) suppository 650 mg  650 mg Rectal Q6H PRN Stoioff, Scott C, MD      . amLODipine (NORVASC) tablet 10 mg  10 mg Oral Daily Stoioff, Scott C, MD   10 mg at 10/24/19 1052  . atorvastatin (LIPITOR) tablet 20 mg  20 mg Oral Daily Stoioff, Scott C, MD   20 mg at 10/24/19 1052  . buPROPion (WELLBUTRIN XL) 24 hr tablet 150 mg  150 mg Oral Daily Stoioff, Scott C, MD   150 mg at 10/24/19 1052  . calcium carbonate (TUMS - dosed  in mg elemental calcium) chewable tablet 500 mg  500 mg Oral Daily Stoioff, Scott C, MD   500 mg at 10/24/19 1053  . Chlorhexidine Gluconate Cloth 2 % PADS 6 each  6 each Topical Daily Fritzi Mandes, MD   6 each at 10/24/19 1202  . ciprofloxacin (CIPRO) IVPB 400 mg  400 mg Intravenous Q24H Stoioff, Scott C, MD 200 mL/hr at 10/24/19 1409 400 mg at 10/24/19 1409  . DULoxetine (CYMBALTA) DR capsule 60 mg  60 mg Oral Daily Stoioff, Scott C, MD   60 mg at 10/24/19 1052  . dutasteride (AVODART) capsule 0.5 mg  0.5 mg Oral Daily Stoioff, Scott C, MD   0.5 mg at 10/24/19 1054  . emtricitabine-rilpivir-tenofovir AF (ODEFSEY) 200-25-25 MG per tablet 1 tablet  1 tablet Oral Daily Stoioff, Ronda Fairly, MD   1 tablet at 10/24/19 1407  . feeding supplement (BOOST / RESOURCE BREEZE) liquid 1 Container  1 Container Oral TID BM Danford,  Suann Larry, MD   1 Container at 10/24/19 1412  . fesoterodine (TOVIAZ) tablet 8 mg  8 mg Oral Daily Stoioff, Scott C, MD   8 mg at 10/24/19 1052  . insulin aspart (novoLOG) injection 0-15 Units  0-15 Units Subcutaneous TID WC Stoioff, Ronda Fairly, MD   5 Units at 10/24/19 1201  . insulin aspart (novoLOG) injection 0-5 Units  0-5 Units Subcutaneous QHS Stoioff, Scott C, MD      . ketotifen (ZADITOR) 0.025 % ophthalmic solution 1 drop  1 drop Both Eyes BID PRN Stoioff, Scott C, MD      . Melatonin TABS 10 mg  10 mg Oral QHS Stoioff, Scott C, MD   10 mg at 10/23/19 2106  . mirabegron ER (MYRBETRIQ) tablet 25 mg  25 mg Oral Daily Stoioff, Scott C, MD   25 mg at 10/24/19 1052  . multivitamin with minerals tablet 1 tablet  1 tablet Oral Daily Stoioff, Ronda Fairly, MD   1 tablet at 10/24/19 1052  . omega-3 acid ethyl esters (LOVAZA) capsule 2 g  2 g Oral Daily Stoioff, Scott C, MD   2 g at 10/24/19 1051  . ondansetron (ZOFRAN) tablet 4 mg  4 mg Oral Q6H PRN Stoioff, Scott C, MD       Or  . ondansetron (ZOFRAN) injection 4 mg  4 mg Intravenous Q6H PRN Stoioff, Scott C, MD      . opium-belladonna (B&O SUPPRETTES) 16.2-60 MG suppository 1 suppository  1 suppository Rectal Q8H PRN Stoioff, Scott C, MD      . risperiDONE (RISPERDAL) tablet 1 mg  1 mg Oral QHS Stoioff, Scott C, MD   1 mg at 10/23/19 2106  . sodium chloride flush (NS) 0.9 % injection 3 mL  3 mL Intravenous Q12H Stoioff, Scott C, MD   3 mL at 10/23/19 2108  . sodium chloride irrigation 0.9 % 3,000 mL  3,000 mL Irrigation Continuous Stoioff, Scott C, MD   3,000 mL at 10/24/19 0605  . tamsulosin (FLOMAX) capsule 0.4 mg  0.4 mg Oral Daily Stoioff, Scott C, MD   0.4 mg at 10/24/19 1051  . traZODone (DESYREL) tablet 300 mg  300 mg Oral QHS Stoioff, Scott C, MD   300 mg at 10/23/19 2106     Discharge Medications: Please see discharge summary for a list of discharge medications.  Relevant Imaging Results:  Relevant Lab Results:   Additional  Information IP#[id number]  Beverly Sessions, RN

## 2019-10-24 NOTE — Progress Notes (Signed)
Debroah Loop PA said to leave the CBI clamped  But to turn it back on at a very slow drip if the urine turns dark pink or red.

## 2019-10-24 NOTE — Progress Notes (Signed)
Urology Inpatient Progress Note  Subjective: Darrell [name] is a 79 y.o. male with PMH diabetes, HIV, hypertension, chronic anemia, and stage IV CKD s/p AV fistula with baseline creatinine 3-4, and prostate cancer s/p radiation therapy admitted on 10/22/2019 with recurrent clot retention secondary to radiation cystitis.  He is POD 2 from cystoscopy with clot evacuation and bladder fulguration with Dr. Bernardo Heater, on CBI.   Creatinine remains stable at baseline, 3.83 today.  Hemoglobin down today, 6.7.  VSS.  He reports generalized fatigue today.  He denies pain.  Pink-tinged urinary output on slow drip CBI.  Urinary debris visible in his drainage tubing.  Anti-infectives: Anti-infectives (From admission, onward)   Start     Dose/Rate Route Frequency Ordered Stop   10/23/19 1000  emtricitabine-rilpivir-tenofovir AF (ODEFSEY) 200-25-25 MG per tablet 1 tablet     1 tablet Oral Daily 10/22/19 1506     10/22/19 1530  ciprofloxacin (CIPRO) IVPB 400 mg     400 mg 200 mL/hr over 60 Minutes Intravenous Every 24 hours 10/22/19 1524        Current Facility-Administered Medications  Medication Dose Route Frequency Provider Last Rate Last Admin  . 0.9 %  sodium chloride infusion (Manually program via Guardrails IV Fluids)   Intravenous Once Lang Snow, NP      . 0.9 %  sodium chloride infusion   Intravenous Continuous Lang Snow, NP 75 mL/hr at 10/24/19 0743 New Bag at 10/24/19 0743  . acetaminophen (TYLENOL) tablet 650 mg  650 mg Oral Q6H PRN Stoioff, Scott C, MD       Or  . acetaminophen (TYLENOL) suppository 650 mg  650 mg Rectal Q6H PRN Stoioff, Scott C, MD      . amLODipine (NORVASC) tablet 10 mg  10 mg Oral Daily Stoioff, Scott C, MD   10 mg at 10/23/19 0914  . atorvastatin (LIPITOR) tablet 20 mg  20 mg Oral Daily Stoioff, Scott C, MD   20 mg at 10/23/19 0911  . buPROPion (WELLBUTRIN XL) 24 hr tablet 150 mg  150 mg Oral Daily Stoioff, Scott C, MD   150 mg at 10/23/19 0911   . calcium carbonate (TUMS - dosed in mg elemental calcium) chewable tablet 500 mg  500 mg Oral Daily Stoioff, Scott C, MD   500 mg at 10/23/19 0911  . Chlorhexidine Gluconate Cloth 2 % PADS 6 each  6 each Topical Daily Fritzi Mandes, MD   6 each at 10/23/19 1400  . ciprofloxacin (CIPRO) IVPB 400 mg  400 mg Intravenous Q24H Stoioff, Scott C, MD 200 mL/hr at 10/23/19 1756 400 mg at 10/23/19 1756  . DULoxetine (CYMBALTA) DR capsule 60 mg  60 mg Oral Daily Stoioff, Scott C, MD   60 mg at 10/23/19 0913  . dutasteride (AVODART) capsule 0.5 mg  0.5 mg Oral Daily Stoioff, Scott C, MD   0.5 mg at 10/23/19 0912  . emtricitabine-rilpivir-tenofovir AF (ODEFSEY) 200-25-25 MG per tablet 1 tablet  1 tablet Oral Daily Stoioff, Scott C, MD      . feeding supplement (BOOST / RESOURCE BREEZE) liquid 1 Container  1 Container Oral TID BM Edwin Dada, MD   1 Container at 10/23/19 2107  . fesoterodine (TOVIAZ) tablet 8 mg  8 mg Oral Daily Stoioff, Scott C, MD   8 mg at 10/23/19 0913  . insulin aspart (novoLOG) injection 0-15 Units  0-15 Units Subcutaneous TID WC Stoioff, Ronda Fairly, MD   2 Units at 10/23/19 1750  .  insulin aspart (novoLOG) injection 0-5 Units  0-5 Units Subcutaneous QHS Stoioff, Scott C, MD      . ketotifen (ZADITOR) 0.025 % ophthalmic solution 1 drop  1 drop Both Eyes BID PRN Stoioff, Scott C, MD      . Melatonin TABS 10 mg  10 mg Oral QHS Stoioff, Scott C, MD   10 mg at 10/23/19 2106  . mirabegron ER (MYRBETRIQ) tablet 25 mg  25 mg Oral Daily Stoioff, Scott C, MD   25 mg at 10/23/19 0912  . multivitamin with minerals tablet 1 tablet  1 tablet Oral Daily Abbie Sons, MD   1 tablet at 10/23/19 0914  . omega-3 acid ethyl esters (LOVAZA) capsule 2 g  2 g Oral Daily Stoioff, Scott C, MD   2 g at 10/23/19 0912  . ondansetron (ZOFRAN) tablet 4 mg  4 mg Oral Q6H PRN Stoioff, Scott C, MD       Or  . ondansetron (ZOFRAN) injection 4 mg  4 mg Intravenous Q6H PRN Stoioff, Scott C, MD      .  opium-belladonna (B&O SUPPRETTES) 16.2-60 MG suppository 1 suppository  1 suppository Rectal Q8H PRN Stoioff, Scott C, MD      . risperiDONE (RISPERDAL) tablet 1 mg  1 mg Oral QHS Stoioff, Scott C, MD   1 mg at 10/23/19 2106  . sodium chloride flush (NS) 0.9 % injection 3 mL  3 mL Intravenous Q12H Stoioff, Scott C, MD   3 mL at 10/23/19 2108  . sodium chloride irrigation 0.9 % 3,000 mL  3,000 mL Irrigation Continuous Stoioff, Scott C, MD   3,000 mL at 10/24/19 0605  . tamsulosin (FLOMAX) capsule 0.4 mg  0.4 mg Oral Daily Stoioff, Scott C, MD   0.4 mg at 10/23/19 0913  . traZODone (DESYREL) tablet 300 mg  300 mg Oral QHS Stoioff, Scott C, MD   300 mg at 10/23/19 2106   Objective: Vital signs in last 24 hours: Temp:  [98.2 F (36.8 C)-98.4 F (36.9 C)] 98.4 F (36.9 C) (01/27 0541) Pulse Rate:  [73-76] 73 (01/27 0541) Resp:  [16-18] 18 (01/27 0541) BP: (125-132)/(59-72) 125/59 (01/27 0541) SpO2:  [99 %-100 %] 100 % (01/27 0541)  Intake/Output from previous day: 01/26 0701 - 01/27 0700 In: 6641.4 [P.O.:240; IV Piggyback:401.4] Out: 7700 [Urine:7700] Intake/Output this shift: Total I/O In: -  Out: 1150 [Urine:1150]  Physical Exam Vitals and nursing note reviewed.  Constitutional:      General: He is not in acute distress.    Appearance: He is not ill-appearing, toxic-appearing or diaphoretic.  HENT:     Head: Normocephalic and atraumatic.  Pulmonary:     Effort: Pulmonary effort is normal. No respiratory distress.  Abdominal:     Tenderness: There is no abdominal tenderness.  Skin:    General: Skin is warm and dry.  Neurological:     Mental Status: He is alert and oriented to person, place, and time.  Psychiatric:        Mood and Affect: Mood normal.        Behavior: Behavior normal.    Lab Results:  Recent Labs    10/23/19 0458 10/24/19 0428  WBC 6.7 5.8  HGB 7.8* 6.7*  HCT 24.0* 20.5*  PLT 118* 112*   BMET Recent Labs    10/23/19 0458 10/24/19 0428  NA 145  141  K 4.7 4.6  CL 113* 112*  CO2 25 24  GLUCOSE 129* 102*  BUN 52*  41*  CREATININE 3.79* 3.83*  CALCIUM 7.8* 7.6*   PT/INR Recent Labs    10/23/19 0458  LABPROT 14.6  INR 1.2   Assessment & Plan: 79 year old comorbid male admitted with recurrent clot retention secondary to radiation cystitis, now s/p cystoscopy with clot evacuation and bladder fulguration with Dr. Bernardo Heater.  Urine barely pink this morning.  I was able to flush his catheter tubing of visible debris this morning without difficulty by increasing his CBI flow rate.  I subsequently stopped CBI.  Recommend close monitoring of his urinary output today with titration of CBI to maintain light pink or clearer urine if needed.  Hemoglobin <7 today and patient does report fatigue.  May consider transfusion today for symptomatic anemia.  Debroah Loop, PA-C 10/24/2019

## 2019-10-25 DIAGNOSIS — E119 Type 2 diabetes mellitus without complications: Secondary | ICD-10-CM | POA: Diagnosis not present

## 2019-10-25 DIAGNOSIS — N3041 Irradiation cystitis with hematuria: Secondary | ICD-10-CM | POA: Diagnosis not present

## 2019-10-25 DIAGNOSIS — E785 Hyperlipidemia, unspecified: Secondary | ICD-10-CM | POA: Diagnosis not present

## 2019-10-25 DIAGNOSIS — N184 Chronic kidney disease, stage 4 (severe): Secondary | ICD-10-CM | POA: Diagnosis not present

## 2019-10-25 DIAGNOSIS — E559 Vitamin D deficiency, unspecified: Secondary | ICD-10-CM | POA: Diagnosis not present

## 2019-10-25 DIAGNOSIS — R2689 Other abnormalities of gait and mobility: Secondary | ICD-10-CM | POA: Diagnosis not present

## 2019-10-25 DIAGNOSIS — R319 Hematuria, unspecified: Secondary | ICD-10-CM | POA: Diagnosis not present

## 2019-10-25 DIAGNOSIS — D500 Iron deficiency anemia secondary to blood loss (chronic): Secondary | ICD-10-CM | POA: Diagnosis not present

## 2019-10-25 DIAGNOSIS — F209 Schizophrenia, unspecified: Secondary | ICD-10-CM

## 2019-10-25 DIAGNOSIS — M2550 Pain in unspecified joint: Secondary | ICD-10-CM | POA: Diagnosis not present

## 2019-10-25 DIAGNOSIS — R402411 Glasgow coma scale score 13-15, in the field [EMT or ambulance]: Secondary | ICD-10-CM | POA: Diagnosis not present

## 2019-10-25 DIAGNOSIS — Z7401 Bed confinement status: Secondary | ICD-10-CM | POA: Diagnosis not present

## 2019-10-25 DIAGNOSIS — R1310 Dysphagia, unspecified: Secondary | ICD-10-CM | POA: Diagnosis not present

## 2019-10-25 DIAGNOSIS — D649 Anemia, unspecified: Secondary | ICD-10-CM | POA: Diagnosis not present

## 2019-10-25 DIAGNOSIS — D508 Other iron deficiency anemias: Secondary | ICD-10-CM | POA: Diagnosis not present

## 2019-10-25 DIAGNOSIS — R55 Syncope and collapse: Secondary | ICD-10-CM | POA: Diagnosis not present

## 2019-10-25 DIAGNOSIS — I5032 Chronic diastolic (congestive) heart failure: Secondary | ICD-10-CM | POA: Diagnosis not present

## 2019-10-25 DIAGNOSIS — R531 Weakness: Secondary | ICD-10-CM | POA: Diagnosis not present

## 2019-10-25 DIAGNOSIS — I10 Essential (primary) hypertension: Secondary | ICD-10-CM | POA: Diagnosis not present

## 2019-10-25 DIAGNOSIS — N185 Chronic kidney disease, stage 5: Secondary | ICD-10-CM | POA: Diagnosis not present

## 2019-10-25 DIAGNOSIS — E1122 Type 2 diabetes mellitus with diabetic chronic kidney disease: Secondary | ICD-10-CM | POA: Diagnosis not present

## 2019-10-25 DIAGNOSIS — Z743 Need for continuous supervision: Secondary | ICD-10-CM | POA: Diagnosis not present

## 2019-10-25 DIAGNOSIS — R278 Other lack of coordination: Secondary | ICD-10-CM | POA: Diagnosis not present

## 2019-10-25 DIAGNOSIS — E1169 Type 2 diabetes mellitus with other specified complication: Secondary | ICD-10-CM | POA: Diagnosis not present

## 2019-10-25 DIAGNOSIS — N3281 Overactive bladder: Secondary | ICD-10-CM | POA: Diagnosis not present

## 2019-10-25 DIAGNOSIS — I951 Orthostatic hypotension: Secondary | ICD-10-CM | POA: Diagnosis not present

## 2019-10-25 DIAGNOSIS — N3040 Irradiation cystitis without hematuria: Secondary | ICD-10-CM | POA: Diagnosis not present

## 2019-10-25 DIAGNOSIS — I110 Hypertensive heart disease with heart failure: Secondary | ICD-10-CM | POA: Diagnosis not present

## 2019-10-25 DIAGNOSIS — M6281 Muscle weakness (generalized): Secondary | ICD-10-CM | POA: Diagnosis not present

## 2019-10-25 LAB — HELPER T-LYMPH-CD4 (ARMC ONLY)
% CD 4 Pos. Lymph.: 23 % — ABNORMAL LOW (ref 30.8–58.5)
Absolute CD 4 Helper: 368 /uL (ref 359–1519)
Basophils Absolute: 0 10*3/uL (ref 0.0–0.2)
Basos: 0 %
EOS (ABSOLUTE): 0.4 10*3/uL (ref 0.0–0.4)
Eos: 6 %
Hematocrit: 18.9 % — ABNORMAL LOW (ref 37.5–51.0)
Hemoglobin: 6.6 g/dL — ABNORMAL LOW (ref 13.0–17.7)
Immature Grans (Abs): 0 10*3/uL (ref 0.0–0.1)
Immature Granulocytes: 0 %
Lymphocytes Absolute: 1.6 10*3/uL (ref 0.7–3.1)
Lymphs: 28 %
MCH: 32.4 pg (ref 26.6–33.0)
MCHC: 34.9 g/dL (ref 31.5–35.7)
MCV: 93 fL (ref 79–97)
Monocytes Absolute: 0.5 10*3/uL (ref 0.1–0.9)
Monocytes: 9 %
Neutrophils Absolute: 3.1 10*3/uL (ref 1.4–7.0)
Neutrophils: 57 %
Platelets: 106 10*3/uL — ABNORMAL LOW (ref 150–450)
RBC: 2.04 x10E6/uL — ABNORMAL LOW (ref 4.14–5.80)
RDW: 15.4 % (ref 11.6–15.4)
WBC: 5.6 10*3/uL (ref 3.4–10.8)

## 2019-10-25 LAB — CBC
HCT: 22.8 % — ABNORMAL LOW (ref 39.0–52.0)
Hemoglobin: 7.6 g/dL — ABNORMAL LOW (ref 13.0–17.0)
MCH: 30.9 pg (ref 26.0–34.0)
MCHC: 33.3 g/dL (ref 30.0–36.0)
MCV: 92.7 fL (ref 80.0–100.0)
Platelets: 109 10*3/uL — ABNORMAL LOW (ref 150–400)
RBC: 2.46 MIL/uL — ABNORMAL LOW (ref 4.22–5.81)
RDW: 15.4 % (ref 11.5–15.5)
WBC: 4.5 10*3/uL (ref 4.0–10.5)
nRBC: 0 % (ref 0.0–0.2)

## 2019-10-25 LAB — BASIC METABOLIC PANEL
Anion gap: 6 (ref 5–15)
BUN: 50 mg/dL — ABNORMAL HIGH (ref 8–23)
CO2: 23 mmol/L (ref 22–32)
Calcium: 7.9 mg/dL — ABNORMAL LOW (ref 8.9–10.3)
Chloride: 112 mmol/L — ABNORMAL HIGH (ref 98–111)
Creatinine, Ser: 3.7 mg/dL — ABNORMAL HIGH (ref 0.61–1.24)
GFR calc Af Amer: 17 mL/min — ABNORMAL LOW (ref >60)
GFR calc non Af Amer: 15 mL/min — ABNORMAL LOW (ref >60)
Glucose, Bld: 130 mg/dL — ABNORMAL HIGH (ref 70–99)
Potassium: 4.8 mmol/L (ref 3.5–5.1)
Sodium: 141 mmol/L (ref 135–145)

## 2019-10-25 LAB — GLUCOSE, CAPILLARY
Glucose-Capillary: 129 mg/dL — ABNORMAL HIGH (ref 70–99)
Glucose-Capillary: 213 mg/dL — ABNORMAL HIGH (ref 70–99)
Glucose-Capillary: 125 mg/dL — ABNORMAL HIGH (ref 70–99)

## 2019-10-25 LAB — PREPARE RBC (CROSSMATCH)

## 2019-10-25 MED ORDER — SODIUM CHLORIDE 0.9% IV SOLUTION: Freq: Once | INTRAVENOUS | Status: DC

## 2019-10-25 MED ORDER — ENSURE ENLIVE PO LIQD
237.0000 mL | Freq: Two times a day (BID) | ORAL | Status: DC
  Administered 2019-10-25: 13:00:00 237 mL via ORAL

## 2019-10-25 MED ORDER — TRAMADOL HCL 50 MG PO TABS: 50.0000 mg | ORAL_TABLET | Freq: Four times a day (QID) | ORAL | 0 refills | Status: AC | PRN

## 2019-10-25 MED ORDER — INSULIN ASPART 100 UNIT/ML ~~LOC~~ SOLN: 0.0000 [IU] | Freq: Three times a day (TID) | SUBCUTANEOUS | 11 refills | Status: AC

## 2019-10-25 MED ORDER — ZOLPIDEM TARTRATE 10 MG PO TABS: 10.0000 mg | ORAL_TABLET | Freq: Every evening | ORAL | 0 refills | Status: AC | PRN

## 2019-10-25 MED ORDER — ENSURE ENLIVE PO LIQD: 237.0000 mL | Freq: Two times a day (BID) | ORAL | 12 refills | Status: AC

## 2019-10-25 MED ORDER — BELLADONNA ALKALOIDS-OPIUM 16.2-60 MG RE SUPP: 1.0000 | Freq: Three times a day (TID) | RECTAL | 0 refills | Status: AC | PRN

## 2019-10-25 MED ORDER — FERROUS SULFATE 325 (65 FE) MG PO TBEC: 325.0000 mg | DELAYED_RELEASE_TABLET | Freq: Two times a day (BID) | ORAL | 3 refills | Status: AC

## 2019-10-25 MED ORDER — INSULIN NPH ISOPHANE & REGULAR (70-30) 100 UNIT/ML ~~LOC~~ SUSP: 5.0000 [IU] | Freq: Two times a day (BID) | SUBCUTANEOUS | 11 refills | Status: AC

## 2019-10-25 MED ORDER — ACETAMINOPHEN 325 MG PO TABS: 650.0000 mg | ORAL_TABLET | Freq: Four times a day (QID) | ORAL | Status: AC | PRN

## 2019-10-25 NOTE — Discharge Summary (Signed)
Physician Discharge Summary  Darrell [name] TKP:[id number] DOB: 02-20-1974 DOA: 10/22/2019  PCP: Cher Nakai, MD  Admit date: 10/22/2019 Discharge date: 10/25/2019  Admitted From: [facility]  Disposition:  [facility]   Recommendations for Outpatient Follow-up:  1. Follow up with PCP in 1-2 weeks after discharge from SNF 2. Please assist patient with PACE program referral and/or long-term care referral if difficulty with rehab 3. Please obtain CBC on Monday Feb 1; check ferritin in 4 weeks        Home Health: N/A  Equipment/Devices: TBD at SNF  Discharge Condition: Fair  CODE STATUS: DNR Diet recommendation: Diabetic      Brief/Interim Summary: Darrell [name] is a 79 y.o. M with CKD 4 with AV fistula in place, DM, HTN, HIV, hx prostate cancer s/p Radx, non-malignant bladder tumor recent resection, persistent hematuria, recent pseudomonal UTI who presented with gross hematuria and suprapubic pain.  In the ER, his catheter was changed out and URology were consutled who took the patient for cystoscopy and fulguration and started CBI.     PRINCIPAL HOSPITAL DIAGNOSIS: Gross hematuria    Discharge Diagnoses:   Gross hematuria due to radiation cystitis suspected S/p cystoscopy with fulguration 1/25 by Dr. Bernardo Heater Patient was admitted and underwent cystoscopy with fulguration on 1/25 by Dr. Bernardo Heater. He was subsequently started on continuous bladder irrigation for 48 h.   His urine progressively cleared to light pink, bladder irrigation was stopped, and the patient was able to void without difficulty. Urology follow-up should occur in about 2 weeks. He was continued on Flomax and dutasteride.    Doubt UTI Treated with 3 days of empiric ciprofloxacin. Urine culture no significant growth.  Diabetes Glucoses well controlled here. Resume insulin 70/30 at lower dose, with sliding scale corrections, continue atorvastatin.    Anemia Recent baseline  hemoglobin around 9.   Hemoglobin down to 6.7 g/dL in the hospital. Transfused 2 units.  Start ferrous sulfate, repeat hemoglobin in 1 week. Check iron levels in 4 weeks.   Chronic kidney disease stage IV Baseline creatinine low 3s, CKD stage IV.   Creatinine stable at the time of discharge.  HIV CD4 count 368, normal.  Continue emtricitabine, rilpivir, tenofovir AF.  Schizophrenia Mood Disorder Continue duloxetine, bupropion, risperidone  Hypertension BP controlled. Continue amlodipine  Moderate protein calorie malnutrition As evidenced by chronic disease, inadequate oral intake and reduced subcutaneous muscle mass and fat.  Continue Ensure       Discharge Instructions  Discharge Instructions    Discharge instructions   Complete by: As directed    Schedule follow up with Urology, Dr. Bernardo Heater in 1-2 weeks  Resume insulin 70/30 at lower dose of 5 BID Add sliding scale correction insulin while in rehab, discontinue when discharged from Rehab  If patient has bloody urine for 2 or more days or inability to void for >24 hours, call Dr. Dene Gentry office prior to transport to ER  Do NOT place a Foley catheter for urinary incontinence and only for documented urinary retention, or after discussing with Urology   Increase activity slowly   Complete by: As directed      Allergies as of 10/25/2019      Reactions   Percodan [oxycodone-aspirin] Other (See Comments)   Prickly feeling in hands, arms and torso      Medication List    TAKE these medications   acetaminophen 325 MG tablet Commonly known as: TYLENOL Take 2 tablets (650 mg total) by mouth every 6 (six)  hours as needed for mild pain (or Fever >/= 101).   amLODipine 10 MG tablet Commonly known as: NORVASC Take 10 mg by mouth daily.   atorvastatin 20 MG tablet Commonly known as: LIPITOR TAKE ONE TABLET BY MOUTH EVERY DAY   buPROPion 150 MG 24 hr tablet Commonly known as: WELLBUTRIN XL Take 150 mg by mouth  daily.   CALCIUM 600 PO Take 600 mg by mouth daily.   DULoxetine 60 MG capsule Commonly known as: CYMBALTA Take 60 mg by mouth daily.   dutasteride 0.5 MG capsule Commonly known as: AVODART Take 0.5 mg by mouth daily.   feeding supplement (ENSURE ENLIVE) Liqd Take 237 mLs by mouth 2 (two) times daily between meals.   fesoterodine 8 MG Tb24 tablet Commonly known as: TOVIAZ Take 8 mg by mouth daily.   FIBER PO Take 1 tablet by mouth daily.   Fish Oil 1000 MG Caps Take 1,000 mg by mouth 2 (two) times a day.   insulin aspart 100 UNIT/ML injection Commonly known as: novoLOG Inject 0-15 Units into the skin 3 (three) times daily with meals.   insulin NPH-regular Human (70-30) 100 UNIT/ML injection Inject 5 Units into the skin 2 (two) times daily with a meal. What changed:   how much to take  when to take this   ketotifen 0.025 % ophthalmic solution Commonly known as: ZADITOR Place 1 drop into both eyes 2 (two) times daily as needed (irritated eyes).   Melatonin 5 MG Tabs Take 10 mg by mouth at bedtime.   mirabegron ER 25 MG Tb24 tablet Commonly known as: MYRBETRIQ Take 25 mg by mouth daily.   multivitamin with minerals Tabs tablet Take 1 tablet by mouth daily.   Odefsey 200-25-25 MG Tabs tablet Generic drug: emtricitabine-rilpivir-tenofovir AF Take 1 tablet by mouth daily.   opium-belladonna 16.2-60 MG suppository Commonly known as: B&O SUPPRETTES Place 1 suppository rectally every 8 (eight) hours as needed for bladder spasms.   risperiDONE 1 MG tablet Commonly known as: RISPERDAL Take 1 mg by mouth at bedtime.   tamsulosin 0.4 MG Caps capsule Commonly known as: FLOMAX TAKE ONE CAPSULE BY MOUTH EVERY DAY   traMADol 50 MG tablet Commonly known as: Ultram Take 1 tablet (50 mg total) by mouth every 6 (six) hours as needed.   trazodone 300 MG tablet Commonly known as: DESYREL Take 300 mg by mouth at bedtime.   zolpidem 10 MG tablet Commonly known as:  AMBIEN Take 1 tablet (10 mg total) by mouth at bedtime as needed for sleep. What changed:   when to take this  reasons to take this       Contact information for follow-up providers    Stoioff, Ronda Fairly, MD. Schedule an appointment as soon as possible for a visit in 2 week(s).   Specialty: Urology Contact information: [address] [place] [address] [phone]            Contact information for after-discharge care    [facility] .   Service: Skilled Nursing Contact information: [address] [address] [phone]                 Allergies  Allergen Reactions  . Percodan [Oxycodone-Aspirin] Other (See Comments)    Prickly feeling in hands, arms and torso    Consultations:  Urology   Procedures/Studies: DG OR UROLOGY CYSTO IMAGE ([place])  Result Date: 10/22/2019 There  is no interpretation for this exam.  This order is for images obtained during a surgical procedure.  Please See "Surgeries" Tab for more information regarding the procedure.  Preoperative diagnosis:  1. Gross hematuria secondary to radiation cystitis  Postoperative diagnosis:  1. Same  Procedure: 1. Cystoscopy with clot evacuation 2. Bladder fulguration  Surgeon: Abbie Sons, MD  Anesthesia: General  Complications: None  Intraoperative findings:  -Urethra without lesions or strictures -Mild lateral lobe enlargement distally with open bladder neck and necrotic appearing tissue consistent with prior resection -Diffuse venous bleeding sites primarily right bladder neck 8-11 o'clock position and the adjacent bladder mucosa just inside the bladder neck -Mild oozing to a lesser extent left bladder neck and left bladder wall -TUR changes left bladder wall -Inflammatory changes trigone but no active bleeding -Right ureteral orifice identified -Left ureteral orifice not definitely  defined however no bleeding noted left hemitrigone and no fulguration performed in the areas of the UOs  EBL: Minimal  Specimens: None  Indication: Darrell [name] is a 79 y.o. patient with recurrent gross hematuria secondary to radiation cystitis. Prior fulguration, resection for hemodynamically significant bleeding in [facility] December 2020. Cystoscopy clot evacuation/fulguration performed by Dr. Alinda Money in [facility] 1/14. Patient presented to [facility] ED for recurrent bleeding with clotting of his catheter. Refer to my consult note for details. After reviewing the management options for treatment, he elected to proceed with the above surgical procedure(s). We have discussed the potential benefits and risks of the procedure, side effects of the proposed treatment, the likelihood of the patient achieving the goals of the procedure, and any potential problems that might occur during the procedure or recuperation. Informed consent has been obtained.  Description of procedure:  The patient was taken to the operating room and general anesthesia was induced.  The patient was placed in the dorsal lithotomy position, Foley catheter was removed and he was prepped and draped in the usual sterile fashion. Preoperative antibiotics were administered in the ED. A preoperative time-out was performed.   A 21 French cystoscope was lubricated and passed under direct vision. Once in the bladder approximately 15 syringeful's of dark clot and burgundy effluent was irrigated from the bladder. Once all clot was removed there was significant improvement in the effluent.  The cystoscope was removed and a 26 French continuous-flow resectoscope sheath with visual obturator was passed under direct vision with findings as described above. The obturator was removed and replaced with an [place] with button electrode. Repeat panendoscopy was performed with findings as described above.  All bleeding sites were  fulgurated with the button electrode. Once all areas were treated the inflow was reduced to minimal and no other active bleeding sites were noted.  The resectoscope was removed and a 24 [place] Couvelaire three-way catheter was placed and 20 cc of sterile water was placed in the balloon. The catheter was irrigated with a [place] syringe and was crystal clear. CBI was placed on brisk inflow with crystal-clear effluent.  After anesthetic reversal the patient was transported to the PACU in stable condition.  Plan: CBI overnight titrating to keep effluent pink-tinged to clear.          Subjective: Patient is feeling tired, but no new fever, abdominal pain, bladder pain.  He has mild dysuria.  His urine is pink-tinged.  Discharge Exam: Vitals:   10/25/19 1340 10/25/19 1417  BP: 119/73 133/66  Pulse: 83 77  Resp: 13 13  Temp: 98.1 F (36.7 C) 98.4 F (36.9  C)  SpO2: 100% 100%   Vitals:   10/25/19 0944 10/25/19 1133 10/25/19 1340 10/25/19 1417  BP: (!) 125/58 125/76 119/73 133/66  Pulse:  82 83 77  Resp:   13 13  Temp:   98.1 F (36.7 C) 98.4 F (36.9 C)  TempSrc:   Axillary Axillary  SpO2:  99% 100% 100%  Weight:      Height:        General: Pt is alert, awake, not in acute distress appears debilitated, weak, lying in bed Cardiovascular: RRR, nl S1-S2, no murmurs appreciated.   No LE edema.   Respiratory: Normal respiratory rate and rhythm.  CTAB without rales or wheezes. Abdominal: Abdomen soft and non-tender.  No distension or HSM.   Neuro/Psych: Strength symmetric in upper extremities.  Lower extremities appear extremely weak bilaterally.  Judgment and insight appear normal.   The results of significant diagnostics from this hospitalization (including imaging, microbiology, ancillary and laboratory) are listed below for reference.     Microbiology: Recent Results (from the past 240 hour(s))  Respiratory Panel by RT PCR (Flu A&B, Covid) - Nasopharyngeal Swab      Status: None   Collection Time: 10/22/19  2:50 PM   Specimen: Nasopharyngeal Swab  Result Value Ref Range Status   SARS Coronavirus 2 by RT PCR NEGATIVE NEGATIVE Final    Comment: (NOTE) SARS-CoV-2 target nucleic acids are NOT DETECTED. The SARS-CoV-2 RNA is generally detectable in upper respiratoy specimens during the acute phase of infection. The lowest concentration of SARS-CoV-2 viral copies this assay can detect is 131 copies/mL. A negative result does not preclude SARS-Cov-2 infection and should not be used as the sole basis for treatment or other patient management decisions. A negative result may occur with  improper specimen collection/handling, submission of specimen other than nasopharyngeal swab, presence of viral mutation(s) within the areas targeted by this assay, and inadequate number of viral copies (<131 copies/mL). A negative result must be combined with clinical observations, patient history, and epidemiological information. The expected result is Negative. Fact Sheet for Patients:  PinkCheek.be Fact Sheet for Healthcare Providers:  GravelBags.it This test is not yet ap proved or cleared by the [place] FDA and  has been authorized for detection and/or diagnosis of SARS-CoV-2 by FDA under an Emergency Use Authorization (EUA). This EUA will remain  in effect (meaning this test can be used) for the duration of the COVID-19 declaration under Section 564(b)(1) of the Act, 21 U.S.C. section 360bbb-3(b)(1), unless the authorization is terminated or revoked sooner.    Influenza A by PCR NEGATIVE NEGATIVE Final   Influenza B by PCR NEGATIVE NEGATIVE Final    Comment: (NOTE) The Xpert Xpress SARS-CoV-2/FLU/RSV assay is intended as an aid in  the diagnosis of influenza from Nasopharyngeal swab specimens and  should not be used as a sole basis for treatment. Nasal washings and  aspirates are unacceptable for  Xpert Xpress SARS-CoV-2/FLU/RSV  testing. Fact Sheet for Patients: PinkCheek.be Fact Sheet for Healthcare Providers: GravelBags.it This test is not yet approved or cleared by the [place] FDA and  has been authorized for detection and/or diagnosis of SARS-CoV-2 by  FDA under an Emergency Use Authorization (EUA). This EUA will remain  in effect (meaning this test can be used) for the duration of the  Covid-19 declaration under Section 564(b)(1) of the Act, 21  U.S.C. section 360bbb-3(b)(1), unless the authorization is  terminated or revoked. Performed at [facility], [place], [place]  [address]   Urine Culture     Status: Abnormal   Collection Time: 10/23/19  6:52 PM   Specimen: Urine, Random  Result Value Ref Range Status   Specimen Description   Final    URINE, RANDOM Performed at [facility], [address] [place], [place] [address]    Special Requests   Final    NONE Performed at [facility], [address] [place],  [address]    Culture MULTIPLE SPECIES PRESENT, SUGGEST RECOLLECTION (A)  Final   Report Status 10/24/2019 FINAL  Final     Labs: BNP (last 3 results) No results for input(s): BNP in the last 8760 hours. Basic Metabolic Panel: Recent Labs  Lab 10/22/19 1058 10/23/19 0458 10/24/19 0428 10/25/19 0653  NA 141 145 141 141  K 4.5 4.7 4.6 4.8  CL 112* 113* 112* 112*  CO2 19* 25 24 23   GLUCOSE 145* 129* 102* 130*  BUN 50* 52* 55* 50*  CREATININE 3.43* 3.79* 3.83* 3.70*  CALCIUM 7.9* 7.8* 7.6* 7.9*   Liver Function Tests: Recent Labs  Lab 10/23/19 0458  AST 12*  ALT 14  ALKPHOS 52  BILITOT 0.5  PROT 4.7*  ALBUMIN 1.9*   No results for input(s): LIPASE, AMYLASE in the last 168 hours. No results for input(s): AMMONIA in the last 168 hours. CBC: Recent Labs  Lab 10/22/19 1014 10/22/19 1014 10/22/19 1336 10/22/19 1336  10/23/19 0458 10/24/19 0428 10/24/19 1425 10/25/19 0653  WBC 7.9   < > 9.6  --  6.7 5.6  5.8  --  4.5  NEUTROABS 5.5  --   --   --   --  3.1  --   --   HGB 9.4*   < > 9.3*   < > 7.8* 6.7*  6.6* 8.6* 7.6*  HCT 28.7*   < > 28.0*   < > 24.0* 20.5*  18.9* 25.8* 22.8*  MCV 94.1   < > 92.7  --  94.1 94.0  93  --  92.7  PLT 118*   < > 124*  --  118* 112*  106*  --  109*   < > = values in this interval not displayed.   Cardiac Enzymes: No results for input(s): CKTOTAL, CKMB, CKMBINDEX, TROPONINI in the last 168 hours. BNP: Invalid input(s): POCBNP CBG: Recent Labs  Lab 10/24/19 1143 10/24/19 1624 10/24/19 2122 10/25/19 0834 10/25/19 1131  GLUCAP 201* 124* 101* 129* 213*   D-Dimer No results for input(s): DDIMER in the last 72 hours. Hgb A1c Recent Labs    10/22/19 1611  HGBA1C 5.1   Lipid Profile No results for input(s): CHOL, HDL, LDLCALC, TRIG, CHOLHDL, LDLDIRECT in the last 72 hours. Thyroid function studies No results for input(s): TSH, T4TOTAL, T3FREE, THYROIDAB in the last 72 hours.  Invalid input(s): FREET3 Anemia work up No results for input(s): VITAMINB12, FOLATE, FERRITIN, TIBC, IRON, RETICCTPCT in the last 72 hours. Urinalysis    Component Value Date/Time   COLORURINE RED (A) 10/22/2019 1336   APPEARANCEUR HAZY (A) 10/22/2019 1336   LABSPEC 1.005 10/22/2019 1336   PHURINE  10/22/2019 1336    TEST NOT REPORTED DUE TO COLOR INTERFERENCE OF URINE PIGMENT   GLUCOSEU (A) 10/22/2019 1336    TEST NOT REPORTED DUE TO COLOR INTERFERENCE OF URINE PIGMENT   HGBUR (A) 10/22/2019 1336    TEST NOT REPORTED DUE TO COLOR INTERFERENCE OF URINE PIGMENT   BILIRUBINUR (A) 10/22/2019 1336    TEST NOT  REPORTED DUE TO COLOR INTERFERENCE OF URINE PIGMENT   KETONESUR (A) 10/22/2019 1336    TEST NOT REPORTED DUE TO COLOR INTERFERENCE OF URINE PIGMENT   PROTEINUR (A) 10/22/2019 1336    TEST NOT REPORTED DUE TO COLOR INTERFERENCE OF URINE PIGMENT   NITRITE (A) 10/22/2019 1336     TEST NOT REPORTED DUE TO COLOR INTERFERENCE OF URINE PIGMENT   LEUKOCYTESUR (A) 10/22/2019 1336    TEST NOT REPORTED DUE TO COLOR INTERFERENCE OF URINE PIGMENT   Sepsis Labs Invalid input(s): PROCALCITONIN,  WBC,  LACTICIDVEN Microbiology Recent Results (from the past 240 hour(s))  Respiratory Panel by RT PCR (Flu A&B, Covid) - Nasopharyngeal Swab     Status: None   Collection Time: 10/22/19  2:50 PM   Specimen: Nasopharyngeal Swab  Result Value Ref Range Status   SARS Coronavirus 2 by RT PCR NEGATIVE NEGATIVE Final    Comment: (NOTE) SARS-CoV-2 target nucleic acids are NOT DETECTED. The SARS-CoV-2 RNA is generally detectable in upper respiratoy specimens during the acute phase of infection. The lowest concentration of SARS-CoV-2 viral copies this assay can detect is 131 copies/mL. A negative result does not preclude SARS-Cov-2 infection and should not be used as the sole basis for treatment or other patient management decisions. A negative result may occur with  improper specimen collection/handling, submission of specimen other than nasopharyngeal swab, presence of viral mutation(s) within the areas targeted by this assay, and inadequate number of viral copies (<131 copies/mL). A negative result must be combined with clinical observations, patient history, and epidemiological information. The expected result is Negative. Fact Sheet for Patients:  PinkCheek.be Fact Sheet for Healthcare Providers:  GravelBags.it This test is not yet ap proved or cleared by the [place] FDA and  has been authorized for detection and/or diagnosis of SARS-CoV-2 by FDA under an Emergency Use Authorization (EUA). This EUA will remain  in effect (meaning this test can be used) for the duration of the COVID-19 declaration under Section 564(b)(1) of the Act, 21 U.S.C. section 360bbb-3(b)(1), unless the authorization is terminated  or revoked sooner.    Influenza A by PCR NEGATIVE NEGATIVE Final   Influenza B by PCR NEGATIVE NEGATIVE Final    Comment: (NOTE) The Xpert Xpress SARS-CoV-2/FLU/RSV assay is intended as an aid in  the diagnosis of influenza from Nasopharyngeal swab specimens and  should not be used as a sole basis for treatment. Nasal washings and  aspirates are unacceptable for Xpert Xpress SARS-CoV-2/FLU/RSV  testing. Fact Sheet for Patients: PinkCheek.be Fact Sheet for Healthcare Providers: GravelBags.it This test is not yet approved or cleared by the [place] FDA and  has been authorized for detection and/or diagnosis of SARS-CoV-2 by  FDA under an Emergency Use Authorization (EUA). This EUA will remain  in effect (meaning this test can be used) for the duration of the  Covid-19 declaration under Section 564(b)(1) of the Act, 21  U.S.C. section 360bbb-3(b)(1), unless the authorization is  terminated or revoked. Performed at [facility], [address], [place], [place] [address]   Urine Culture     Status: Abnormal   Collection Time: 10/23/19  6:52 PM   Specimen: Urine, Random  Result Value Ref Range Status   Specimen Description   Final    URINE, RANDOM Performed at [facility], [address], [place], [place] [address]    Special Requests   Final    NONE Performed at [facility], [address] [place], [place] [address]  Culture MULTIPLE SPECIES PRESENT, SUGGEST RECOLLECTION (A)  Final   Report Status 10/24/2019 FINAL  Final     Time coordinating discharge: 35 minutes The [place] controlled substances registry was reviewed for this patient prior to filling the <5 days supply controlled substances script.      SIGNED:   Edwin Dada, MD  [facility] 10/25/2019, 2:43 PM

## 2019-10-25 NOTE — Progress Notes (Signed)
Darrell Darrell [name] and O x4. VSS. Pt tolerating diet well. No complaints of nausea or vomiting. IV removed intact, prescriptions given. Pt voices understanding of discharge instructions with no further questions. Patient discharged via stretcher with PTAR  Allergies as of 10/25/2019      Reactions   Percodan [oxycodone-aspirin] Other (See Comments)   Prickly feeling in hands, arms and torso      Medication List    TAKE these medications   acetaminophen 325 MG tablet Commonly known as: TYLENOL Take 2 tablets (650 mg total) by mouth every 6 (six) hours as needed for mild pain (or Fever >/= 101).   amLODipine 10 MG tablet Commonly known as: NORVASC Take 10 mg by mouth daily.   atorvastatin 20 MG tablet Commonly known as: LIPITOR TAKE ONE TABLET BY MOUTH EVERY DAY   buPROPion 150 MG 24 hr tablet Commonly known as: WELLBUTRIN XL Take 150 mg by mouth daily.   CALCIUM 600 PO Take 600 mg by mouth daily.   DULoxetine 60 MG capsule Commonly known as: CYMBALTA Take 60 mg by mouth daily.   dutasteride 0.5 MG capsule Commonly known as: AVODART Take 0.5 mg by mouth daily.   feeding supplement (ENSURE ENLIVE) Liqd Take 237 mLs by mouth 2 (two) times daily between meals.   ferrous sulfate 325 (65 FE) MG EC tablet Take 1 tablet (325 mg total) by mouth 2 (two) times daily.   fesoterodine 8 MG Tb24 tablet Commonly known as: TOVIAZ Take 8 mg by mouth daily.   FIBER PO Take 1 tablet by mouth daily.   Fish Oil 1000 MG Caps Take 1,000 mg by mouth 2 (two) times Darrell [name] day.   insulin aspart 100 UNIT/ML injection Commonly known as: novoLOG Inject 0-15 Units into the skin 3 (three) times daily with meals.   insulin NPH-regular Human (70-30) 100 UNIT/ML injection Inject 5 Units into the skin 2 (two) times daily with Darrell [name] meal. What changed:   how much to take  when to take this   ketotifen 0.025 % ophthalmic solution Commonly known as: ZADITOR Place 1 drop into both eyes 2 (two) times  daily as needed (irritated eyes).   Melatonin 5 MG Tabs Take 10 mg by mouth at bedtime.   mirabegron ER 25 MG Tb24 tablet Commonly known as: MYRBETRIQ Take 25 mg by mouth daily.   multivitamin with minerals Tabs tablet Take 1 tablet by mouth daily.   Odefsey 200-25-25 MG Tabs tablet Generic drug: emtricitabine-rilpivir-tenofovir AF Take 1 tablet by mouth daily.   opium-belladonna 16.2-60 MG suppository Commonly known as: B&O SUPPRETTES Place 1 suppository rectally every 8 (eight) hours as needed for bladder spasms.   risperiDONE 1 MG tablet Commonly known as: RISPERDAL Take 1 mg by mouth at bedtime.   tamsulosin 0.4 MG Caps capsule Commonly known as: FLOMAX TAKE ONE CAPSULE BY MOUTH EVERY DAY   traMADol 50 MG tablet Commonly known as: Ultram Take 1 tablet (50 mg total) by mouth every 6 (six) hours as needed.   trazodone 300 MG tablet Commonly known as: DESYREL Take 300 mg by mouth at bedtime.   zolpidem 10 MG tablet Commonly known as: AMBIEN Take 1 tablet (10 mg total) by mouth at bedtime as needed for sleep. What changed:   when to take this  reasons to take this       Vitals:   10/25/19 1417 10/25/19 1730  BP: 133/66 137/74  Pulse: 77 76  Resp: 13 14  Temp: 98.4 F (36.9  C) 98.5 F (36.9 C)  SpO2: 100% 100%    Darrell Darrell [name]

## 2019-10-25 NOTE — TOC Transition Note (Signed)
Transition of Care [facility]) - CM/SW Discharge Note   Patient Details  Name: Darrell [name] MRN: [id number] Date of Birth: [birth year]  Transition of Care [facility]) CM/SW Contact:  Beverly Sessions, RN Phone Number: 10/25/2019, 5:32 PM   Clinical Narrative:     Patient from [facility] Would like to be placed closer to home in [place]  Bed accepted at [facility] and rehab.  Patient to discharge today.   DC info sent in the [place]  Discussed options of LTC with patient after he completes his rehab days.  Also provided patient with information on PACE services in [facility]  Patient to be transported by [employer] and DNR on chart  Bedside RN notified   Per patients request updated hi point of contact [place]       Patient Goals and CMS Choice        Discharge Placement                       Discharge Plan and Services                                     Social Determinants of Health (SDOH) Interventions     Readmission Risk Interventions Readmission Risk Prevention Plan 10/25/2019 10/25/2019  Transportation Screening Complete Complete  PCP or Specialist Appt within 3-5 Days (No Data) (No Data)  HRI or [place] (No Data) (No Data)  Palliative Care Screening Not Applicable Not Applicable  Medication Review (RN Care Manager) Complete Complete  Some recent data might be hidden

## 2019-10-25 NOTE — Progress Notes (Addendum)
Urology Inpatient Progress Note  Subjective: Darrell [name] is a 79 y.o. male with PMH diabetes, HIV, hypertension, chronic anemia, and stage IV CKD s/p AV fistula with baseline creatinine 3-4, and prostate cancer s/p radiation therapy admitted on 10/22/2019 with recurrent clot retention secondary to radiation cystitis.  He is POD 3 from cystoscopy with clot evacuation and bladder fulguration with Dr. Bernardo Heater.  He was transfused with 1 unit PRBCs yesterday for symptomatic anemia.  Creatinine remains stably at baseline, 3.70.  Hemoglobin 7.6 today.  Hemoglobin remains off with light pink urinary output.  There is slight debris visible in his urine.  Today, he reports some discomfort around his catheter site.  He denies pain otherwise.  He says his fatigue has improved since yesterday.  Anti-infectives: Anti-infectives (From admission, onward)   Start     Dose/Rate Route Frequency Ordered Stop   10/23/19 1000  emtricitabine-rilpivir-tenofovir AF (ODEFSEY) 200-25-25 MG per tablet 1 tablet     1 tablet Oral Daily 10/22/19 1506     10/22/19 1530  ciprofloxacin (CIPRO) IVPB 400 mg  Status:  Discontinued     400 mg 200 mL/hr over 60 Minutes Intravenous Every 24 hours 10/22/19 1524 10/25/19 0824      Current Facility-Administered Medications  Medication Dose Route Frequency Provider Last Rate Last Admin  . 0.9 %  sodium chloride infusion (Manually program via Guardrails IV Fluids)   Intravenous Once Danford, Suann Larry, MD      . acetaminophen (TYLENOL) tablet 650 mg  650 mg Oral Q6H PRN Stoioff, Scott C, MD   650 mg at 10/24/19 2146   Or  . acetaminophen (TYLENOL) suppository 650 mg  650 mg Rectal Q6H PRN Stoioff, Scott C, MD      . amLODipine (NORVASC) tablet 10 mg  10 mg Oral Daily Stoioff, Scott C, MD   10 mg at 10/24/19 1052  . atorvastatin (LIPITOR) tablet 20 mg  20 mg Oral Daily Stoioff, Scott C, MD   20 mg at 10/24/19 1052  . buPROPion (WELLBUTRIN XL) 24 hr tablet 150 mg  150 mg Oral  Daily Stoioff, Scott C, MD   150 mg at 10/24/19 1052  . calcium carbonate (TUMS - dosed in mg elemental calcium) chewable tablet 500 mg  500 mg Oral Daily Stoioff, Scott C, MD   500 mg at 10/24/19 1053  . Chlorhexidine Gluconate Cloth 2 % PADS 6 each  6 each Topical Daily Fritzi Mandes, MD   6 each at 10/24/19 1202  . DULoxetine (CYMBALTA) DR capsule 60 mg  60 mg Oral Daily Stoioff, Scott C, MD   60 mg at 10/24/19 1052  . dutasteride (AVODART) capsule 0.5 mg  0.5 mg Oral Daily Stoioff, Scott C, MD   0.5 mg at 10/24/19 1054  . emtricitabine-rilpivir-tenofovir AF (ODEFSEY) 200-25-25 MG per tablet 1 tablet  1 tablet Oral Daily Stoioff, Ronda Fairly, MD   1 tablet at 10/24/19 1407  . feeding supplement (BOOST / RESOURCE BREEZE) liquid 1 Container  1 Container Oral TID BM Edwin Dada, MD   1 Container at 10/24/19 2145  . fesoterodine (TOVIAZ) tablet 8 mg  8 mg Oral Daily Stoioff, Scott C, MD   8 mg at 10/24/19 1052  . insulin aspart (novoLOG) injection 0-15 Units  0-15 Units Subcutaneous TID WC Stoioff, Ronda Fairly, MD   2 Units at 10/24/19 1708  . insulin aspart (novoLOG) injection 0-5 Units  0-5 Units Subcutaneous QHS Stoioff, Ronda Fairly, MD      .  ketotifen (ZADITOR) 0.025 % ophthalmic solution 1 drop  1 drop Both Eyes BID PRN Stoioff, Scott C, MD      . Melatonin TABS 10 mg  10 mg Oral QHS Stoioff, Scott C, MD   10 mg at 10/24/19 2146  . mirabegron ER (MYRBETRIQ) tablet 25 mg  25 mg Oral Daily Stoioff, Scott C, MD   25 mg at 10/24/19 1052  . multivitamin with minerals tablet 1 tablet  1 tablet Oral Daily Stoioff, Ronda Fairly, MD   1 tablet at 10/24/19 1052  . omega-3 acid ethyl esters (LOVAZA) capsule 2 g  2 g Oral Daily Stoioff, Scott C, MD   2 g at 10/24/19 1051  . ondansetron (ZOFRAN) tablet 4 mg  4 mg Oral Q6H PRN Stoioff, Scott C, MD       Or  . ondansetron (ZOFRAN) injection 4 mg  4 mg Intravenous Q6H PRN Stoioff, Scott C, MD      . opium-belladonna (B&O SUPPRETTES) 16.2-60 MG suppository 1  suppository  1 suppository Rectal Q8H PRN Stoioff, Scott C, MD      . risperiDONE (RISPERDAL) tablet 1 mg  1 mg Oral QHS Stoioff, Scott C, MD   1 mg at 10/24/19 2146  . sodium chloride flush (NS) 0.9 % injection 3 mL  3 mL Intravenous Q12H Stoioff, Scott C, MD   3 mL at 10/24/19 2226  . sodium chloride irrigation 0.9 % 3,000 mL  3,000 mL Irrigation Continuous Michai Dieppa, Norman, PA-C   Stopped at 10/24/19 2030  . tamsulosin (FLOMAX) capsule 0.4 mg  0.4 mg Oral Daily Stoioff, Scott C, MD   0.4 mg at 10/24/19 1051  . traZODone (DESYREL) tablet 300 mg  300 mg Oral QHS Stoioff, Scott C, MD   300 mg at 10/24/19 2146   Objective: Vital signs in last 24 hours: Temp:  [97.6 F (36.4 C)-98.3 F (36.8 C)] 98 F (36.7 C) (01/28 0104) Pulse Rate:  [68-77] 68 (01/28 0658) Resp:  [14-20] 20 (01/28 0658) BP: (115-136)/(62-73) 115/62 (01/28 0658) SpO2:  [96 %-100 %] 96 % (01/28 0658)  Intake/Output from previous day: 01/27 0701 - 01/28 0700 In: 2414.3 [I.V.:534.4; Blood:280; IV Piggyback:199.9] Out: 2119 [Urine:4550] Intake/Output this shift: No intake/output data recorded.  Physical Exam Vitals and nursing note reviewed.  Constitutional:      General: He is not in acute distress.    Appearance: He is not ill-appearing, toxic-appearing or diaphoretic.  HENT:     Head: Normocephalic and atraumatic.  Pulmonary:     Effort: Pulmonary effort is normal. No respiratory distress.  Abdominal:     General: There is no distension.     Palpations: Abdomen is soft.     Tenderness: There is no abdominal tenderness. There is no guarding or rebound.  Skin:    General: Skin is warm and dry.  Neurological:     Mental Status: He is alert.    Lab Results:  Recent Labs    10/24/19 0428 10/24/19 0428 10/24/19 1425 10/25/19 0653  WBC 5.8  --   --  4.5  HGB 6.7*   < > 8.6* 7.6*  HCT 20.5*   < > 25.8* 22.8*  PLT 112*  --   --  109*   < > = values in this interval not displayed.   BMET Recent  Labs    10/24/19 0428 10/25/19 0653  NA 141 141  K 4.6 4.8  CL 112* 112*  CO2 24 23  GLUCOSE 102* 130*  BUN 55* 50*  CREATININE 3.83* 3.70*  CALCIUM 7.6* 7.9*   PT/INR Recent Labs    10/23/19 0458  LABPROT 14.6  INR 1.2   Assessment & Plan: 79 year old comorbid male admitted with recurrent clot retention secondary to radiation cystitis, now s/p cystoscopy with clot evacuation and bladder fulguration with Dr. Bernardo Heater.  Urine remains light pink off CBI x24 hours.  I elected to remove his catheter today.  18 mL of water was drained from the balloon and a 24 French three-way Couvelaire Foley cath was removed from the bladder.  No complications were noted.  Patient tolerated well.  I counseled him that he may experience some dysuria immediately following catheter removal as well as slightly increased bleeding, however I expect these to resolve.  If he has persistent bloody output or decreased urination, please contact us for further evaluation.  Okay to discontinue antibiotics for possible UTI; he has had 4 days of Cipro which should cover him perioperatively.  Okay to discharge from urologic perspective.  Debroah Loop, PA-C 10/25/2019

## 2019-10-25 NOTE — Evaluation (Signed)
Physical Therapy Evaluation Patient Details Name: Darrell [name] MRN: [id number] DOB: 01/02/1941 Today's Date: 10/25/2019   History of Present Illness  Darrell [name] is a 79 y.o. male with medical history significant for CKD 4 with AV fistula in place, diabetes mellitus type 2, HIV, hypertension, chronic normocytic anemia, history of prostate cancer status post radiation treatment.  Patient has had recurrent issues with gross hematuria with hospitalizations in December 2020 as well as January 2021.  In December he failed continuous bladder irrigation and underwent cystoscopy with subsequent biopsy and resection and fulguration of a necrotic bladder tumor.  Pathology without evidence of malignancy.  He was taken back to the OR January 7 and underwent a TUR of a necrotic bladder tumor with adjacent prostatic tissue.  Despite all of this patient continued to have gross hematuria and he was subsequently transferred to [facility] for more detailed urological care.  During that hospitalization patient required 12 units of PRBCs.  He also had positive Pseudomonas growing in his urine and was treated for that with antibiotics.  He had issues with neurogenic bladder and a Foley catheter was placed.  Urology surmised that these issues were likely from radiation cystitis from history of prostate cancer.  He subsequently underwent an additional clot evacuation and fulguration of bleeding sites on 1/14 2021.  He was discharged to a skilled nursing facility and was referred to the wound care center by urology with plans to schedule hyperbaric oxygen therapy prevent further episodes of radiation cystitis.   Clinical Impression  Pt is a pleasant 79 year old male who was admitted for hematuria. Pt scheduled for 2nd transfusion today, however ordered to perform PT evaluation prior to transfusion from attending MD. CBI just discontinued this date. Pt from SNF with plans to return to SNF. Pt agreeable to therapy,  however reports he feels fatigued. Pt performs bed mobility with min assist +2, and transfers/ambulation with mod A +2 and RW. Ambulation limited due to deficits. Pt demonstrates deficits with balance/endurance/strength/mobility. Would benefit from skilled PT to address above deficits and promote optimal return to PLOF; recommend transition to STR upon discharge from acute hospitalization.     Follow Up Recommendations SNF    Equipment Recommendations  None recommended by PT    Recommendations for Other Services       Precautions / Restrictions Precautions Precautions: Fall Restrictions Weight Bearing Restrictions: No      Mobility  Bed Mobility Overal bed mobility: Needs Assistance Bed Mobility: Supine to Sit     Supine to sit: Min assist;+2 for safety/equipment     General bed mobility comments: able to follow commands for sequencing. Once seated at EOB, slight dizziness noted, however BP remained WNL. Noted R side leaning with all mobility, able to self correct, however inconsistent  Transfers Overall transfer level: Needs assistance Equipment used: Rolling walker (2 wheeled) Transfers: Sit to/from Stand Sit to Stand: Mod assist;+2 physical assistance         General transfer comment: elevated bed for ease of technique. Pt with mod R side leaning during standing.   Ambulation/Gait Ambulation/Gait assistance: Mod assist;+2 physical assistance Gait Distance (Feet): 5 Feet Assistive device: Rolling walker (2 wheeled) Gait Pattern/deviations: Step-to pattern     General Gait Details: side stepped at bedside x 5' towards [facility]. Multiple LOB in post R direction with cues for sequencing  Stairs            Wheelchair Mobility    Modified Rankin (Stroke Patients Only)  Balance Overall balance assessment: Needs assistance;History of Falls Sitting-balance support: Bilateral upper extremity supported;Feet supported Sitting balance-Leahy Scale:  Poor Sitting balance - Comments: right lateral lean, requires bil UE support Postural control: Right lateral lean Standing balance support: Bilateral upper extremity supported Standing balance-Leahy Scale: Poor Standing balance comment: requires external assist due to right lateral lean                             Pertinent Vitals/Pain Pain Assessment: Faces Faces Pain Scale: Hurts little more Pain Location: B hips Pain Descriptors / Indicators: Discomfort;Dull Pain Intervention(s): Limited activity within patient's tolerance;Repositioned    Home Living Family/patient expects to be discharged to:: Skilled nursing facility                 Additional Comments: previously lived alone, however has been at [facility] for past few months    Prior Function           Comments: Pt reports he hasn't ambulated in 3 months s/p most recent fall. Has been working with therapy, however limited. Prior, was ambulatory with RW     Hand Dominance        Extremity/Trunk Assessment   Upper Extremity Assessment Upper Extremity Assessment: Generalized weakness(B UE grossly 3+/5)    Lower Extremity Assessment Lower Extremity Assessment: Generalized weakness(L LE grossly 3/5; R LE grossly 3+/5)       Communication   Communication: No difficulties  Cognition Arousal/Alertness: Awake/alert Behavior During Therapy: WFL for tasks assessed/performed Overall Cognitive Status: History of cognitive impairments - at baseline                                 General Comments: A&O to self, situation, place. Disoriented to time      General Comments      Exercises Other Exercises Other Exercises: supine ther-ex performed on B LE including AP, quad sets, SLRs, hip abd/add, and SAQ. ALl ther-ex performed x 10 reps with min assist needed. R side stronger compared to L. Other Exercises: Pre gait activities performed including alt. marching x 10 reps with mod assist to  maintain balance. Safe technique performed   Assessment/Plan    PT Assessment Patient needs continued PT services  PT Problem List Decreased strength;Decreased activity tolerance;Decreased balance;Decreased mobility       PT Treatment Interventions DME instruction;Gait training;Therapeutic activities;Therapeutic exercise;Balance training    PT Goals (Current goals can be found in the Care Plan section)  Acute Rehab PT Goals Patient Stated Goal: to walk again PT Goal Formulation: With patient Time For Goal Achievement: 11/08/19 Potential to Achieve Goals: Good    Frequency Min 2X/week   Barriers to discharge        Co-evaluation               AM-PAC PT "6 Clicks" Mobility  Outcome Measure Help needed turning from your back to your side while in a flat bed without using bedrails?: A Little Help needed moving from lying on your back to sitting on the side of a flat bed without using bedrails?: A Lot Help needed moving to and from a bed to a chair (including a wheelchair)?: A Lot Help needed standing up from a chair using your arms (e.g., wheelchair or bedside chair)?: A Lot Help needed to walk in hospital room?: Total Help needed climbing 3-5 steps with a railing? : Total  6 Click Score: 11    End of Session Equipment Utilized During Treatment: Gait belt Activity Tolerance: Patient tolerated treatment well Patient left: in bed;with bed alarm set;with SCD's reapplied Nurse Communication: Mobility status PT Visit Diagnosis: Unsteadiness on feet (R26.81);Muscle weakness (generalized) (M62.81);History of falling (Z91.81);Difficulty in walking, not elsewhere classified (R26.2)    Time: 4862-8241 PT Time Calculation (min) (ACUTE ONLY): 20 min   Charges:   PT Evaluation $PT Eval Low Complexity: 1 Low PT Treatments $Therapeutic Exercise: 8-22 mins        Greggory Stallion, PT, DPT [phone]   Beverlyn Mcginness 10/25/2019, 1:34 PM

## 2019-10-25 NOTE — Care Management Important Message (Signed)
Important Message  Patient Details  Name: Darrell [name] MRN: [id number] Date of Birth: [birth year]   Medicare Important Message Given:  Yes     Dannette Barbara 10/25/2019, 1:36 PM

## 2019-10-25 NOTE — Progress Notes (Signed)
PT Cancellation Note  Patient Details Name: Darrell [name] MRN: [id number] DOB: October 09, 1940   Cancelled Treatment:    Reason Eval/Treat Not Completed: Other (comment). Pt again pending blood transfusion. Of note, CBI clamped at this time. Will hold PT assessment at this time and re-attempt when medically stable.   Adaleigh Warf 10/25/2019, 8:55 AM Greggory Stallion, PT, DPT [phone]

## 2019-10-26 DIAGNOSIS — E1122 Type 2 diabetes mellitus with diabetic chronic kidney disease: Secondary | ICD-10-CM | POA: Diagnosis not present

## 2019-10-26 DIAGNOSIS — N3041 Irradiation cystitis with hematuria: Secondary | ICD-10-CM | POA: Diagnosis not present

## 2019-10-26 DIAGNOSIS — I5032 Chronic diastolic (congestive) heart failure: Secondary | ICD-10-CM | POA: Diagnosis not present

## 2019-10-26 DIAGNOSIS — I110 Hypertensive heart disease with heart failure: Secondary | ICD-10-CM | POA: Diagnosis not present

## 2019-10-26 LAB — BPAM RBC
Blood Product Expiration Date: 202101302359
Blood Product Expiration Date: 202101302359
ISSUE DATE / TIME: 202101271112
ISSUE DATE / TIME: 202101281344
Unit Type and Rh: 9500
Unit Type and Rh: 9500

## 2019-10-26 LAB — TYPE AND SCREEN
ABO/RH(D): O POS
Antibody Screen: NEGATIVE
Unit division: 0
Unit division: 0

## 2019-10-27 DIAGNOSIS — R531 Weakness: Secondary | ICD-10-CM | POA: Diagnosis not present

## 2019-10-27 DIAGNOSIS — I110 Hypertensive heart disease with heart failure: Secondary | ICD-10-CM | POA: Diagnosis not present

## 2019-10-27 DIAGNOSIS — E1169 Type 2 diabetes mellitus with other specified complication: Secondary | ICD-10-CM | POA: Diagnosis not present

## 2019-10-27 DIAGNOSIS — E785 Hyperlipidemia, unspecified: Secondary | ICD-10-CM | POA: Diagnosis not present

## 2019-10-30 DIAGNOSIS — N3041 Irradiation cystitis with hematuria: Secondary | ICD-10-CM | POA: Diagnosis not present

## 2019-10-30 DIAGNOSIS — N185 Chronic kidney disease, stage 5: Secondary | ICD-10-CM | POA: Diagnosis not present

## 2019-10-30 DIAGNOSIS — D508 Other iron deficiency anemias: Secondary | ICD-10-CM | POA: Diagnosis not present

## 2019-11-02 DIAGNOSIS — D508 Other iron deficiency anemias: Secondary | ICD-10-CM | POA: Diagnosis not present

## 2019-11-02 DIAGNOSIS — N3041 Irradiation cystitis with hematuria: Secondary | ICD-10-CM | POA: Diagnosis not present

## 2019-11-02 DIAGNOSIS — N185 Chronic kidney disease, stage 5: Secondary | ICD-10-CM | POA: Diagnosis not present

## 2019-11-02 DIAGNOSIS — E559 Vitamin D deficiency, unspecified: Secondary | ICD-10-CM | POA: Diagnosis not present

## 2019-11-04 DIAGNOSIS — E1169 Type 2 diabetes mellitus with other specified complication: Secondary | ICD-10-CM | POA: Diagnosis not present

## 2019-11-04 DIAGNOSIS — R531 Weakness: Secondary | ICD-10-CM | POA: Diagnosis not present

## 2019-11-04 DIAGNOSIS — E785 Hyperlipidemia, unspecified: Secondary | ICD-10-CM | POA: Diagnosis not present

## 2019-11-04 DIAGNOSIS — I110 Hypertensive heart disease with heart failure: Secondary | ICD-10-CM | POA: Diagnosis not present

## 2019-11-07 DIAGNOSIS — I110 Hypertensive heart disease with heart failure: Secondary | ICD-10-CM | POA: Diagnosis not present

## 2019-11-07 DIAGNOSIS — E1122 Type 2 diabetes mellitus with diabetic chronic kidney disease: Secondary | ICD-10-CM | POA: Diagnosis not present

## 2019-11-07 DIAGNOSIS — N185 Chronic kidney disease, stage 5: Secondary | ICD-10-CM | POA: Diagnosis not present

## 2019-11-12 DIAGNOSIS — M6281 Muscle weakness (generalized): Secondary | ICD-10-CM | POA: Diagnosis not present

## 2019-11-12 DIAGNOSIS — R278 Other lack of coordination: Secondary | ICD-10-CM | POA: Diagnosis not present

## 2019-11-12 DIAGNOSIS — R262 Difficulty in walking, not elsewhere classified: Secondary | ICD-10-CM | POA: Diagnosis not present

## 2019-11-16 DIAGNOSIS — N185 Chronic kidney disease, stage 5: Secondary | ICD-10-CM | POA: Diagnosis not present

## 2019-11-16 DIAGNOSIS — I5032 Chronic diastolic (congestive) heart failure: Secondary | ICD-10-CM | POA: Diagnosis not present

## 2019-11-16 DIAGNOSIS — E1122 Type 2 diabetes mellitus with diabetic chronic kidney disease: Secondary | ICD-10-CM | POA: Diagnosis not present

## 2019-11-16 DIAGNOSIS — I110 Hypertensive heart disease with heart failure: Secondary | ICD-10-CM | POA: Diagnosis not present

## 2019-11-17 DIAGNOSIS — R531 Weakness: Secondary | ICD-10-CM | POA: Diagnosis not present

## 2019-11-17 DIAGNOSIS — R296 Repeated falls: Secondary | ICD-10-CM | POA: Diagnosis not present

## 2019-11-19 DIAGNOSIS — Z03818 Encounter for observation for suspected exposure to other biological agents ruled out: Secondary | ICD-10-CM | POA: Diagnosis not present

## 2019-11-20 DIAGNOSIS — M6281 Muscle weakness (generalized): Secondary | ICD-10-CM | POA: Diagnosis not present

## 2019-11-20 DIAGNOSIS — R278 Other lack of coordination: Secondary | ICD-10-CM | POA: Diagnosis not present

## 2019-11-20 DIAGNOSIS — R262 Difficulty in walking, not elsewhere classified: Secondary | ICD-10-CM | POA: Diagnosis not present

## 2019-11-21 DIAGNOSIS — R809 Proteinuria, unspecified: Secondary | ICD-10-CM | POA: Diagnosis not present

## 2019-11-21 DIAGNOSIS — M6281 Muscle weakness (generalized): Secondary | ICD-10-CM | POA: Diagnosis not present

## 2019-11-21 DIAGNOSIS — R278 Other lack of coordination: Secondary | ICD-10-CM | POA: Diagnosis not present

## 2019-11-21 DIAGNOSIS — R262 Difficulty in walking, not elsewhere classified: Secondary | ICD-10-CM | POA: Diagnosis not present

## 2019-11-21 DIAGNOSIS — N184 Chronic kidney disease, stage 4 (severe): Secondary | ICD-10-CM | POA: Diagnosis not present

## 2019-11-21 DIAGNOSIS — E1122 Type 2 diabetes mellitus with diabetic chronic kidney disease: Secondary | ICD-10-CM | POA: Diagnosis not present

## 2019-11-21 DIAGNOSIS — I129 Hypertensive chronic kidney disease with stage 1 through stage 4 chronic kidney disease, or unspecified chronic kidney disease: Secondary | ICD-10-CM | POA: Diagnosis not present

## 2019-11-21 DIAGNOSIS — D649 Anemia, unspecified: Secondary | ICD-10-CM | POA: Diagnosis not present

## 2019-11-22 DIAGNOSIS — R278 Other lack of coordination: Secondary | ICD-10-CM | POA: Diagnosis not present

## 2019-11-22 DIAGNOSIS — R262 Difficulty in walking, not elsewhere classified: Secondary | ICD-10-CM | POA: Diagnosis not present

## 2019-11-22 DIAGNOSIS — M6281 Muscle weakness (generalized): Secondary | ICD-10-CM | POA: Diagnosis not present

## 2019-11-23 DIAGNOSIS — E1122 Type 2 diabetes mellitus with diabetic chronic kidney disease: Secondary | ICD-10-CM | POA: Diagnosis not present

## 2019-11-23 DIAGNOSIS — I5032 Chronic diastolic (congestive) heart failure: Secondary | ICD-10-CM | POA: Diagnosis not present

## 2019-11-23 DIAGNOSIS — I110 Hypertensive heart disease with heart failure: Secondary | ICD-10-CM | POA: Diagnosis not present

## 2019-11-23 DIAGNOSIS — N185 Chronic kidney disease, stage 5: Secondary | ICD-10-CM | POA: Diagnosis not present

## 2019-11-24 DIAGNOSIS — M6281 Muscle weakness (generalized): Secondary | ICD-10-CM | POA: Diagnosis not present

## 2019-11-24 DIAGNOSIS — R262 Difficulty in walking, not elsewhere classified: Secondary | ICD-10-CM | POA: Diagnosis not present

## 2019-11-24 DIAGNOSIS — R278 Other lack of coordination: Secondary | ICD-10-CM | POA: Diagnosis not present

## 2019-11-26 DIAGNOSIS — R278 Other lack of coordination: Secondary | ICD-10-CM | POA: Diagnosis not present

## 2019-11-26 DIAGNOSIS — M6281 Muscle weakness (generalized): Secondary | ICD-10-CM | POA: Diagnosis not present

## 2019-11-26 DIAGNOSIS — R262 Difficulty in walking, not elsewhere classified: Secondary | ICD-10-CM | POA: Diagnosis not present

## 2019-11-26 DIAGNOSIS — Z03818 Encounter for observation for suspected exposure to other biological agents ruled out: Secondary | ICD-10-CM | POA: Diagnosis not present

## 2019-11-26 DIAGNOSIS — R2681 Unsteadiness on feet: Secondary | ICD-10-CM | POA: Diagnosis not present

## 2019-11-27 DIAGNOSIS — M6281 Muscle weakness (generalized): Secondary | ICD-10-CM | POA: Diagnosis not present

## 2019-11-27 DIAGNOSIS — R262 Difficulty in walking, not elsewhere classified: Secondary | ICD-10-CM | POA: Diagnosis not present

## 2019-11-27 DIAGNOSIS — R2681 Unsteadiness on feet: Secondary | ICD-10-CM | POA: Diagnosis not present

## 2019-11-27 DIAGNOSIS — R278 Other lack of coordination: Secondary | ICD-10-CM | POA: Diagnosis not present

## 2019-11-28 DIAGNOSIS — B9729 Other coronavirus as the cause of diseases classified elsewhere: Secondary | ICD-10-CM | POA: Diagnosis not present

## 2019-11-28 DIAGNOSIS — N185 Chronic kidney disease, stage 5: Secondary | ICD-10-CM | POA: Diagnosis not present

## 2019-12-01 DIAGNOSIS — R531 Weakness: Secondary | ICD-10-CM | POA: Diagnosis not present

## 2019-12-03 DIAGNOSIS — Z03818 Encounter for observation for suspected exposure to other biological agents ruled out: Secondary | ICD-10-CM | POA: Diagnosis not present

## 2019-12-08 DIAGNOSIS — R531 Weakness: Secondary | ICD-10-CM | POA: Diagnosis not present

## 2019-12-08 DIAGNOSIS — I110 Hypertensive heart disease with heart failure: Secondary | ICD-10-CM | POA: Diagnosis not present

## 2019-12-08 DIAGNOSIS — E1169 Type 2 diabetes mellitus with other specified complication: Secondary | ICD-10-CM | POA: Diagnosis not present

## 2019-12-08 DIAGNOSIS — E785 Hyperlipidemia, unspecified: Secondary | ICD-10-CM | POA: Diagnosis not present

## 2019-12-10 DIAGNOSIS — Z03818 Encounter for observation for suspected exposure to other biological agents ruled out: Secondary | ICD-10-CM | POA: Diagnosis not present

## 2019-12-11 DIAGNOSIS — R278 Other lack of coordination: Secondary | ICD-10-CM | POA: Diagnosis not present

## 2019-12-11 DIAGNOSIS — M6281 Muscle weakness (generalized): Secondary | ICD-10-CM | POA: Diagnosis not present

## 2019-12-11 DIAGNOSIS — R262 Difficulty in walking, not elsewhere classified: Secondary | ICD-10-CM | POA: Diagnosis not present

## 2019-12-11 DIAGNOSIS — R2681 Unsteadiness on feet: Secondary | ICD-10-CM | POA: Diagnosis not present

## 2019-12-12 DIAGNOSIS — R278 Other lack of coordination: Secondary | ICD-10-CM | POA: Diagnosis not present

## 2019-12-12 DIAGNOSIS — R2681 Unsteadiness on feet: Secondary | ICD-10-CM | POA: Diagnosis not present

## 2019-12-12 DIAGNOSIS — R262 Difficulty in walking, not elsewhere classified: Secondary | ICD-10-CM | POA: Diagnosis not present

## 2019-12-12 DIAGNOSIS — M6281 Muscle weakness (generalized): Secondary | ICD-10-CM | POA: Diagnosis not present

## 2019-12-13 DIAGNOSIS — M6281 Muscle weakness (generalized): Secondary | ICD-10-CM | POA: Diagnosis not present

## 2019-12-13 DIAGNOSIS — R278 Other lack of coordination: Secondary | ICD-10-CM | POA: Diagnosis not present

## 2019-12-13 DIAGNOSIS — R262 Difficulty in walking, not elsewhere classified: Secondary | ICD-10-CM | POA: Diagnosis not present

## 2019-12-13 DIAGNOSIS — R2681 Unsteadiness on feet: Secondary | ICD-10-CM | POA: Diagnosis not present

## 2019-12-14 DIAGNOSIS — R262 Difficulty in walking, not elsewhere classified: Secondary | ICD-10-CM | POA: Diagnosis not present

## 2019-12-14 DIAGNOSIS — R278 Other lack of coordination: Secondary | ICD-10-CM | POA: Diagnosis not present

## 2019-12-14 DIAGNOSIS — R2681 Unsteadiness on feet: Secondary | ICD-10-CM | POA: Diagnosis not present

## 2019-12-14 DIAGNOSIS — M6281 Muscle weakness (generalized): Secondary | ICD-10-CM | POA: Diagnosis not present

## 2019-12-15 DIAGNOSIS — M6281 Muscle weakness (generalized): Secondary | ICD-10-CM | POA: Diagnosis not present

## 2019-12-15 DIAGNOSIS — R278 Other lack of coordination: Secondary | ICD-10-CM | POA: Diagnosis not present

## 2019-12-15 DIAGNOSIS — R2681 Unsteadiness on feet: Secondary | ICD-10-CM | POA: Diagnosis not present

## 2019-12-15 DIAGNOSIS — R262 Difficulty in walking, not elsewhere classified: Secondary | ICD-10-CM | POA: Diagnosis not present

## 2019-12-17 DIAGNOSIS — Z03818 Encounter for observation for suspected exposure to other biological agents ruled out: Secondary | ICD-10-CM | POA: Diagnosis not present

## 2019-12-17 DIAGNOSIS — R2681 Unsteadiness on feet: Secondary | ICD-10-CM | POA: Diagnosis not present

## 2019-12-17 DIAGNOSIS — M6281 Muscle weakness (generalized): Secondary | ICD-10-CM | POA: Diagnosis not present

## 2019-12-17 DIAGNOSIS — R262 Difficulty in walking, not elsewhere classified: Secondary | ICD-10-CM | POA: Diagnosis not present

## 2019-12-17 DIAGNOSIS — R278 Other lack of coordination: Secondary | ICD-10-CM | POA: Diagnosis not present

## 2019-12-18 DIAGNOSIS — R262 Difficulty in walking, not elsewhere classified: Secondary | ICD-10-CM | POA: Diagnosis not present

## 2019-12-18 DIAGNOSIS — M6281 Muscle weakness (generalized): Secondary | ICD-10-CM | POA: Diagnosis not present

## 2019-12-18 DIAGNOSIS — R2681 Unsteadiness on feet: Secondary | ICD-10-CM | POA: Diagnosis not present

## 2019-12-18 DIAGNOSIS — R278 Other lack of coordination: Secondary | ICD-10-CM | POA: Diagnosis not present

## 2019-12-19 DIAGNOSIS — R2681 Unsteadiness on feet: Secondary | ICD-10-CM | POA: Diagnosis not present

## 2019-12-19 DIAGNOSIS — R278 Other lack of coordination: Secondary | ICD-10-CM | POA: Diagnosis not present

## 2019-12-19 DIAGNOSIS — M6281 Muscle weakness (generalized): Secondary | ICD-10-CM | POA: Diagnosis not present

## 2019-12-19 DIAGNOSIS — R262 Difficulty in walking, not elsewhere classified: Secondary | ICD-10-CM | POA: Diagnosis not present

## 2019-12-20 DIAGNOSIS — M6281 Muscle weakness (generalized): Secondary | ICD-10-CM | POA: Diagnosis not present

## 2019-12-20 DIAGNOSIS — R262 Difficulty in walking, not elsewhere classified: Secondary | ICD-10-CM | POA: Diagnosis not present

## 2019-12-20 DIAGNOSIS — R278 Other lack of coordination: Secondary | ICD-10-CM | POA: Diagnosis not present

## 2019-12-20 DIAGNOSIS — R2681 Unsteadiness on feet: Secondary | ICD-10-CM | POA: Diagnosis not present

## 2019-12-21 DIAGNOSIS — R278 Other lack of coordination: Secondary | ICD-10-CM | POA: Diagnosis not present

## 2019-12-21 DIAGNOSIS — R262 Difficulty in walking, not elsewhere classified: Secondary | ICD-10-CM | POA: Diagnosis not present

## 2019-12-21 DIAGNOSIS — R2681 Unsteadiness on feet: Secondary | ICD-10-CM | POA: Diagnosis not present

## 2019-12-21 DIAGNOSIS — N185 Chronic kidney disease, stage 5: Secondary | ICD-10-CM | POA: Diagnosis not present

## 2019-12-21 DIAGNOSIS — E1122 Type 2 diabetes mellitus with diabetic chronic kidney disease: Secondary | ICD-10-CM | POA: Diagnosis not present

## 2019-12-21 DIAGNOSIS — I5032 Chronic diastolic (congestive) heart failure: Secondary | ICD-10-CM | POA: Diagnosis not present

## 2019-12-21 DIAGNOSIS — I110 Hypertensive heart disease with heart failure: Secondary | ICD-10-CM | POA: Diagnosis not present

## 2019-12-21 DIAGNOSIS — M6281 Muscle weakness (generalized): Secondary | ICD-10-CM | POA: Diagnosis not present

## 2019-12-22 DIAGNOSIS — R278 Other lack of coordination: Secondary | ICD-10-CM | POA: Diagnosis not present

## 2019-12-22 DIAGNOSIS — R262 Difficulty in walking, not elsewhere classified: Secondary | ICD-10-CM | POA: Diagnosis not present

## 2019-12-22 DIAGNOSIS — R2681 Unsteadiness on feet: Secondary | ICD-10-CM | POA: Diagnosis not present

## 2019-12-22 DIAGNOSIS — M6281 Muscle weakness (generalized): Secondary | ICD-10-CM | POA: Diagnosis not present

## 2019-12-24 DIAGNOSIS — R2681 Unsteadiness on feet: Secondary | ICD-10-CM | POA: Diagnosis not present

## 2019-12-24 DIAGNOSIS — M6281 Muscle weakness (generalized): Secondary | ICD-10-CM | POA: Diagnosis not present

## 2019-12-24 DIAGNOSIS — R262 Difficulty in walking, not elsewhere classified: Secondary | ICD-10-CM | POA: Diagnosis not present

## 2019-12-24 DIAGNOSIS — Z03818 Encounter for observation for suspected exposure to other biological agents ruled out: Secondary | ICD-10-CM | POA: Diagnosis not present

## 2019-12-24 DIAGNOSIS — R278 Other lack of coordination: Secondary | ICD-10-CM | POA: Diagnosis not present

## 2019-12-25 DIAGNOSIS — R2681 Unsteadiness on feet: Secondary | ICD-10-CM | POA: Diagnosis not present

## 2019-12-25 DIAGNOSIS — M6281 Muscle weakness (generalized): Secondary | ICD-10-CM | POA: Diagnosis not present

## 2019-12-25 DIAGNOSIS — R278 Other lack of coordination: Secondary | ICD-10-CM | POA: Diagnosis not present

## 2019-12-25 DIAGNOSIS — R262 Difficulty in walking, not elsewhere classified: Secondary | ICD-10-CM | POA: Diagnosis not present

## 2019-12-26 DIAGNOSIS — R2681 Unsteadiness on feet: Secondary | ICD-10-CM | POA: Diagnosis not present

## 2019-12-26 DIAGNOSIS — R262 Difficulty in walking, not elsewhere classified: Secondary | ICD-10-CM | POA: Diagnosis not present

## 2019-12-26 DIAGNOSIS — M6281 Muscle weakness (generalized): Secondary | ICD-10-CM | POA: Diagnosis not present

## 2019-12-26 DIAGNOSIS — R278 Other lack of coordination: Secondary | ICD-10-CM | POA: Diagnosis not present

## 2019-12-27 DIAGNOSIS — R262 Difficulty in walking, not elsewhere classified: Secondary | ICD-10-CM | POA: Diagnosis not present

## 2019-12-27 DIAGNOSIS — R278 Other lack of coordination: Secondary | ICD-10-CM | POA: Diagnosis not present

## 2019-12-27 DIAGNOSIS — R2681 Unsteadiness on feet: Secondary | ICD-10-CM | POA: Diagnosis not present

## 2019-12-27 DIAGNOSIS — M6281 Muscle weakness (generalized): Secondary | ICD-10-CM | POA: Diagnosis not present

## 2019-12-28 DIAGNOSIS — M6281 Muscle weakness (generalized): Secondary | ICD-10-CM | POA: Diagnosis not present

## 2019-12-28 DIAGNOSIS — R2681 Unsteadiness on feet: Secondary | ICD-10-CM | POA: Diagnosis not present

## 2019-12-28 DIAGNOSIS — R262 Difficulty in walking, not elsewhere classified: Secondary | ICD-10-CM | POA: Diagnosis not present

## 2019-12-28 DIAGNOSIS — R278 Other lack of coordination: Secondary | ICD-10-CM | POA: Diagnosis not present

## 2019-12-29 DIAGNOSIS — R278 Other lack of coordination: Secondary | ICD-10-CM | POA: Diagnosis not present

## 2019-12-29 DIAGNOSIS — M6281 Muscle weakness (generalized): Secondary | ICD-10-CM | POA: Diagnosis not present

## 2019-12-29 DIAGNOSIS — R262 Difficulty in walking, not elsewhere classified: Secondary | ICD-10-CM | POA: Diagnosis not present

## 2019-12-29 DIAGNOSIS — R2681 Unsteadiness on feet: Secondary | ICD-10-CM | POA: Diagnosis not present

## 2019-12-31 DIAGNOSIS — R262 Difficulty in walking, not elsewhere classified: Secondary | ICD-10-CM | POA: Diagnosis not present

## 2019-12-31 DIAGNOSIS — R2681 Unsteadiness on feet: Secondary | ICD-10-CM | POA: Diagnosis not present

## 2019-12-31 DIAGNOSIS — R278 Other lack of coordination: Secondary | ICD-10-CM | POA: Diagnosis not present

## 2019-12-31 DIAGNOSIS — M6281 Muscle weakness (generalized): Secondary | ICD-10-CM | POA: Diagnosis not present

## 2019-12-31 DIAGNOSIS — Z03818 Encounter for observation for suspected exposure to other biological agents ruled out: Secondary | ICD-10-CM | POA: Diagnosis not present

## 2020-01-01 DIAGNOSIS — R262 Difficulty in walking, not elsewhere classified: Secondary | ICD-10-CM | POA: Diagnosis not present

## 2020-01-01 DIAGNOSIS — R278 Other lack of coordination: Secondary | ICD-10-CM | POA: Diagnosis not present

## 2020-01-01 DIAGNOSIS — R2681 Unsteadiness on feet: Secondary | ICD-10-CM | POA: Diagnosis not present

## 2020-01-01 DIAGNOSIS — M6281 Muscle weakness (generalized): Secondary | ICD-10-CM | POA: Diagnosis not present

## 2020-01-02 ENCOUNTER — Encounter: Payer: Self-pay | Admitting: Internal Medicine

## 2020-01-02 DIAGNOSIS — R262 Difficulty in walking, not elsewhere classified: Secondary | ICD-10-CM | POA: Diagnosis not present

## 2020-01-02 DIAGNOSIS — R278 Other lack of coordination: Secondary | ICD-10-CM | POA: Diagnosis not present

## 2020-01-02 DIAGNOSIS — M6281 Muscle weakness (generalized): Secondary | ICD-10-CM | POA: Diagnosis not present

## 2020-01-02 DIAGNOSIS — R2681 Unsteadiness on feet: Secondary | ICD-10-CM | POA: Diagnosis not present

## 2020-01-02 NOTE — Progress Notes (Signed)
Patient ID: Darrell [name], male   DOB: 05-05-1941, 79 y.o.   MRN: [id number] [facility] patient  [place] at [phone] they advised patient is a long term resident there and has not received his letter she advised to mail to [place] [id number] and they will get with him to decide if he wants to come to [facility] or [facility] and call us back

## 2020-01-03 DIAGNOSIS — R262 Difficulty in walking, not elsewhere classified: Secondary | ICD-10-CM | POA: Diagnosis not present

## 2020-01-03 DIAGNOSIS — M6281 Muscle weakness (generalized): Secondary | ICD-10-CM | POA: Diagnosis not present

## 2020-01-03 DIAGNOSIS — R2681 Unsteadiness on feet: Secondary | ICD-10-CM | POA: Diagnosis not present

## 2020-01-03 DIAGNOSIS — R278 Other lack of coordination: Secondary | ICD-10-CM | POA: Diagnosis not present

## 2020-01-05 DIAGNOSIS — R2681 Unsteadiness on feet: Secondary | ICD-10-CM | POA: Diagnosis not present

## 2020-01-05 DIAGNOSIS — M6281 Muscle weakness (generalized): Secondary | ICD-10-CM | POA: Diagnosis not present

## 2020-01-05 DIAGNOSIS — R262 Difficulty in walking, not elsewhere classified: Secondary | ICD-10-CM | POA: Diagnosis not present

## 2020-01-05 DIAGNOSIS — R278 Other lack of coordination: Secondary | ICD-10-CM | POA: Diagnosis not present

## 2020-01-07 DIAGNOSIS — R2681 Unsteadiness on feet: Secondary | ICD-10-CM | POA: Diagnosis not present

## 2020-01-07 DIAGNOSIS — R278 Other lack of coordination: Secondary | ICD-10-CM | POA: Diagnosis not present

## 2020-01-07 DIAGNOSIS — R262 Difficulty in walking, not elsewhere classified: Secondary | ICD-10-CM | POA: Diagnosis not present

## 2020-01-07 DIAGNOSIS — M6281 Muscle weakness (generalized): Secondary | ICD-10-CM | POA: Diagnosis not present

## 2020-01-08 DIAGNOSIS — R262 Difficulty in walking, not elsewhere classified: Secondary | ICD-10-CM | POA: Diagnosis not present

## 2020-01-08 DIAGNOSIS — M6281 Muscle weakness (generalized): Secondary | ICD-10-CM | POA: Diagnosis not present

## 2020-01-08 DIAGNOSIS — R2681 Unsteadiness on feet: Secondary | ICD-10-CM | POA: Diagnosis not present

## 2020-01-08 DIAGNOSIS — R278 Other lack of coordination: Secondary | ICD-10-CM | POA: Diagnosis not present

## 2020-01-09 DIAGNOSIS — R278 Other lack of coordination: Secondary | ICD-10-CM | POA: Diagnosis not present

## 2020-01-09 DIAGNOSIS — R262 Difficulty in walking, not elsewhere classified: Secondary | ICD-10-CM | POA: Diagnosis not present

## 2020-01-09 DIAGNOSIS — M6281 Muscle weakness (generalized): Secondary | ICD-10-CM | POA: Diagnosis not present

## 2020-01-09 DIAGNOSIS — R2681 Unsteadiness on feet: Secondary | ICD-10-CM | POA: Diagnosis not present

## 2020-01-10 DIAGNOSIS — R262 Difficulty in walking, not elsewhere classified: Secondary | ICD-10-CM | POA: Diagnosis not present

## 2020-01-10 DIAGNOSIS — R2681 Unsteadiness on feet: Secondary | ICD-10-CM | POA: Diagnosis not present

## 2020-01-10 DIAGNOSIS — R278 Other lack of coordination: Secondary | ICD-10-CM | POA: Diagnosis not present

## 2020-01-10 DIAGNOSIS — M6281 Muscle weakness (generalized): Secondary | ICD-10-CM | POA: Diagnosis not present

## 2020-01-11 DIAGNOSIS — R2681 Unsteadiness on feet: Secondary | ICD-10-CM | POA: Diagnosis not present

## 2020-01-11 DIAGNOSIS — M6281 Muscle weakness (generalized): Secondary | ICD-10-CM | POA: Diagnosis not present

## 2020-01-11 DIAGNOSIS — R262 Difficulty in walking, not elsewhere classified: Secondary | ICD-10-CM | POA: Diagnosis not present

## 2020-01-11 DIAGNOSIS — R278 Other lack of coordination: Secondary | ICD-10-CM | POA: Diagnosis not present

## 2020-01-12 DIAGNOSIS — R278 Other lack of coordination: Secondary | ICD-10-CM | POA: Diagnosis not present

## 2020-01-12 DIAGNOSIS — M6281 Muscle weakness (generalized): Secondary | ICD-10-CM | POA: Diagnosis not present

## 2020-01-12 DIAGNOSIS — R2681 Unsteadiness on feet: Secondary | ICD-10-CM | POA: Diagnosis not present

## 2020-01-12 DIAGNOSIS — R262 Difficulty in walking, not elsewhere classified: Secondary | ICD-10-CM | POA: Diagnosis not present

## 2020-01-14 DIAGNOSIS — Z03818 Encounter for observation for suspected exposure to other biological agents ruled out: Secondary | ICD-10-CM | POA: Diagnosis not present

## 2020-01-15 DIAGNOSIS — R262 Difficulty in walking, not elsewhere classified: Secondary | ICD-10-CM | POA: Diagnosis not present

## 2020-01-15 DIAGNOSIS — R278 Other lack of coordination: Secondary | ICD-10-CM | POA: Diagnosis not present

## 2020-01-15 DIAGNOSIS — R2681 Unsteadiness on feet: Secondary | ICD-10-CM | POA: Diagnosis not present

## 2020-01-15 DIAGNOSIS — M6281 Muscle weakness (generalized): Secondary | ICD-10-CM | POA: Diagnosis not present

## 2020-01-16 DIAGNOSIS — R278 Other lack of coordination: Secondary | ICD-10-CM | POA: Diagnosis not present

## 2020-01-16 DIAGNOSIS — M6281 Muscle weakness (generalized): Secondary | ICD-10-CM | POA: Diagnosis not present

## 2020-01-16 DIAGNOSIS — R262 Difficulty in walking, not elsewhere classified: Secondary | ICD-10-CM | POA: Diagnosis not present

## 2020-01-16 DIAGNOSIS — R2681 Unsteadiness on feet: Secondary | ICD-10-CM | POA: Diagnosis not present

## 2020-01-18 DIAGNOSIS — N185 Chronic kidney disease, stage 5: Secondary | ICD-10-CM | POA: Diagnosis not present

## 2020-01-18 DIAGNOSIS — E1122 Type 2 diabetes mellitus with diabetic chronic kidney disease: Secondary | ICD-10-CM | POA: Diagnosis not present

## 2020-01-18 DIAGNOSIS — D508 Other iron deficiency anemias: Secondary | ICD-10-CM | POA: Diagnosis not present

## 2020-01-18 DIAGNOSIS — E559 Vitamin D deficiency, unspecified: Secondary | ICD-10-CM | POA: Diagnosis not present

## 2020-01-19 DIAGNOSIS — Z79899 Other long term (current) drug therapy: Secondary | ICD-10-CM | POA: Diagnosis not present

## 2020-01-19 DIAGNOSIS — D649 Anemia, unspecified: Secondary | ICD-10-CM | POA: Diagnosis not present

## 2020-01-19 DIAGNOSIS — E559 Vitamin D deficiency, unspecified: Secondary | ICD-10-CM | POA: Diagnosis not present

## 2020-01-19 DIAGNOSIS — E119 Type 2 diabetes mellitus without complications: Secondary | ICD-10-CM | POA: Diagnosis not present

## 2020-01-21 DIAGNOSIS — E559 Vitamin D deficiency, unspecified: Secondary | ICD-10-CM | POA: Diagnosis not present

## 2020-01-21 DIAGNOSIS — I110 Hypertensive heart disease with heart failure: Secondary | ICD-10-CM | POA: Diagnosis not present

## 2020-01-21 DIAGNOSIS — R531 Weakness: Secondary | ICD-10-CM | POA: Diagnosis not present

## 2020-01-21 DIAGNOSIS — E1169 Type 2 diabetes mellitus with other specified complication: Secondary | ICD-10-CM | POA: Diagnosis not present

## 2020-01-23 DIAGNOSIS — Z03818 Encounter for observation for suspected exposure to other biological agents ruled out: Secondary | ICD-10-CM | POA: Diagnosis not present

## 2020-01-28 DIAGNOSIS — Z03818 Encounter for observation for suspected exposure to other biological agents ruled out: Secondary | ICD-10-CM | POA: Diagnosis not present

## 2020-02-04 DIAGNOSIS — E569 Vitamin deficiency, unspecified: Secondary | ICD-10-CM | POA: Diagnosis not present

## 2020-02-04 DIAGNOSIS — E785 Hyperlipidemia, unspecified: Secondary | ICD-10-CM | POA: Diagnosis not present

## 2020-02-04 DIAGNOSIS — Z03818 Encounter for observation for suspected exposure to other biological agents ruled out: Secondary | ICD-10-CM | POA: Diagnosis not present

## 2020-02-04 DIAGNOSIS — E1169 Type 2 diabetes mellitus with other specified complication: Secondary | ICD-10-CM | POA: Diagnosis not present

## 2020-02-04 DIAGNOSIS — R531 Weakness: Secondary | ICD-10-CM | POA: Diagnosis not present

## 2020-02-06 DIAGNOSIS — I129 Hypertensive chronic kidney disease with stage 1 through stage 4 chronic kidney disease, or unspecified chronic kidney disease: Secondary | ICD-10-CM | POA: Diagnosis not present

## 2020-02-06 DIAGNOSIS — R05 Cough: Secondary | ICD-10-CM | POA: Diagnosis not present

## 2020-02-06 DIAGNOSIS — R809 Proteinuria, unspecified: Secondary | ICD-10-CM | POA: Diagnosis not present

## 2020-02-06 DIAGNOSIS — D649 Anemia, unspecified: Secondary | ICD-10-CM | POA: Diagnosis not present

## 2020-02-06 DIAGNOSIS — N184 Chronic kidney disease, stage 4 (severe): Secondary | ICD-10-CM | POA: Diagnosis not present

## 2020-02-06 DIAGNOSIS — E1122 Type 2 diabetes mellitus with diabetic chronic kidney disease: Secondary | ICD-10-CM | POA: Diagnosis not present

## 2020-02-08 DIAGNOSIS — N184 Chronic kidney disease, stage 4 (severe): Secondary | ICD-10-CM | POA: Diagnosis not present

## 2020-02-08 DIAGNOSIS — D649 Anemia, unspecified: Secondary | ICD-10-CM | POA: Diagnosis not present

## 2020-02-08 DIAGNOSIS — R5383 Other fatigue: Secondary | ICD-10-CM | POA: Diagnosis not present

## 2020-02-13 DIAGNOSIS — Z03818 Encounter for observation for suspected exposure to other biological agents ruled out: Secondary | ICD-10-CM | POA: Diagnosis not present

## 2020-02-18 DIAGNOSIS — I110 Hypertensive heart disease with heart failure: Secondary | ICD-10-CM | POA: Diagnosis not present

## 2020-02-18 DIAGNOSIS — N185 Chronic kidney disease, stage 5: Secondary | ICD-10-CM | POA: Diagnosis not present

## 2020-02-18 DIAGNOSIS — I5032 Chronic diastolic (congestive) heart failure: Secondary | ICD-10-CM | POA: Diagnosis not present

## 2020-02-18 DIAGNOSIS — E1122 Type 2 diabetes mellitus with diabetic chronic kidney disease: Secondary | ICD-10-CM | POA: Diagnosis not present

## 2020-02-20 ENCOUNTER — Encounter: Payer: Self-pay | Admitting: Internal Medicine

## 2020-02-20 NOTE — Progress Notes (Signed)
Patient ID: Darrell [name], male   DOB: [birth year], 79 y.o.   MRN: [id number] [facility] patient is a resident of [place] phone [phone] called spoke with Sharrie Rothman who advised to resend patient letter that was mailed from [facility] before it closed   She advised to fax to attn Sharrie Rothman or [place]   Fax # [phone]     Faxed letter

## 2020-03-04 DIAGNOSIS — Z03818 Encounter for observation for suspected exposure to other biological agents ruled out: Secondary | ICD-10-CM | POA: Diagnosis not present

## 2020-03-10 DIAGNOSIS — Z03818 Encounter for observation for suspected exposure to other biological agents ruled out: Secondary | ICD-10-CM | POA: Diagnosis not present

## 2020-03-14 DIAGNOSIS — E785 Hyperlipidemia, unspecified: Secondary | ICD-10-CM | POA: Diagnosis not present

## 2020-03-14 DIAGNOSIS — R531 Weakness: Secondary | ICD-10-CM | POA: Diagnosis not present

## 2020-03-14 DIAGNOSIS — I110 Hypertensive heart disease with heart failure: Secondary | ICD-10-CM | POA: Diagnosis not present

## 2020-03-14 DIAGNOSIS — E1169 Type 2 diabetes mellitus with other specified complication: Secondary | ICD-10-CM | POA: Diagnosis not present

## 2020-03-18 DIAGNOSIS — Z9181 History of falling: Secondary | ICD-10-CM | POA: Diagnosis not present

## 2020-03-18 DIAGNOSIS — E785 Hyperlipidemia, unspecified: Secondary | ICD-10-CM | POA: Diagnosis not present

## 2020-03-18 DIAGNOSIS — Z0000 Encounter for general adult medical examination without abnormal findings: Secondary | ICD-10-CM | POA: Diagnosis not present

## 2020-03-19 DIAGNOSIS — E1122 Type 2 diabetes mellitus with diabetic chronic kidney disease: Secondary | ICD-10-CM | POA: Diagnosis not present

## 2020-03-19 DIAGNOSIS — I5032 Chronic diastolic (congestive) heart failure: Secondary | ICD-10-CM | POA: Diagnosis not present

## 2020-03-19 DIAGNOSIS — I110 Hypertensive heart disease with heart failure: Secondary | ICD-10-CM | POA: Diagnosis not present

## 2020-03-19 DIAGNOSIS — E559 Vitamin D deficiency, unspecified: Secondary | ICD-10-CM | POA: Diagnosis not present

## 2020-03-26 DIAGNOSIS — M25531 Pain in right wrist: Secondary | ICD-10-CM | POA: Diagnosis not present

## 2020-03-26 DIAGNOSIS — M25532 Pain in left wrist: Secondary | ICD-10-CM | POA: Diagnosis not present

## 2020-04-01 DIAGNOSIS — Z03818 Encounter for observation for suspected exposure to other biological agents ruled out: Secondary | ICD-10-CM | POA: Diagnosis not present

## 2020-04-02 DIAGNOSIS — R278 Other lack of coordination: Secondary | ICD-10-CM | POA: Diagnosis not present

## 2020-04-02 DIAGNOSIS — R262 Difficulty in walking, not elsewhere classified: Secondary | ICD-10-CM | POA: Diagnosis not present

## 2020-04-04 DIAGNOSIS — R278 Other lack of coordination: Secondary | ICD-10-CM | POA: Diagnosis not present

## 2020-04-04 DIAGNOSIS — R262 Difficulty in walking, not elsewhere classified: Secondary | ICD-10-CM | POA: Diagnosis not present

## 2020-04-05 DIAGNOSIS — R278 Other lack of coordination: Secondary | ICD-10-CM | POA: Diagnosis not present

## 2020-04-05 DIAGNOSIS — R262 Difficulty in walking, not elsewhere classified: Secondary | ICD-10-CM | POA: Diagnosis not present

## 2020-04-06 DIAGNOSIS — R278 Other lack of coordination: Secondary | ICD-10-CM | POA: Diagnosis not present

## 2020-04-06 DIAGNOSIS — R262 Difficulty in walking, not elsewhere classified: Secondary | ICD-10-CM | POA: Diagnosis not present

## 2020-04-07 DIAGNOSIS — Z03818 Encounter for observation for suspected exposure to other biological agents ruled out: Secondary | ICD-10-CM | POA: Diagnosis not present

## 2020-04-07 DIAGNOSIS — R262 Difficulty in walking, not elsewhere classified: Secondary | ICD-10-CM | POA: Diagnosis not present

## 2020-04-07 DIAGNOSIS — R278 Other lack of coordination: Secondary | ICD-10-CM | POA: Diagnosis not present

## 2020-04-08 DIAGNOSIS — R262 Difficulty in walking, not elsewhere classified: Secondary | ICD-10-CM | POA: Diagnosis not present

## 2020-04-08 DIAGNOSIS — R278 Other lack of coordination: Secondary | ICD-10-CM | POA: Diagnosis not present

## 2020-04-09 DIAGNOSIS — R278 Other lack of coordination: Secondary | ICD-10-CM | POA: Diagnosis not present

## 2020-04-09 DIAGNOSIS — R262 Difficulty in walking, not elsewhere classified: Secondary | ICD-10-CM | POA: Diagnosis not present

## 2020-04-10 DIAGNOSIS — R278 Other lack of coordination: Secondary | ICD-10-CM | POA: Diagnosis not present

## 2020-04-10 DIAGNOSIS — R262 Difficulty in walking, not elsewhere classified: Secondary | ICD-10-CM | POA: Diagnosis not present

## 2020-04-10 DIAGNOSIS — Z79899 Other long term (current) drug therapy: Secondary | ICD-10-CM | POA: Diagnosis not present

## 2020-04-11 DIAGNOSIS — R278 Other lack of coordination: Secondary | ICD-10-CM | POA: Diagnosis not present

## 2020-04-11 DIAGNOSIS — R262 Difficulty in walking, not elsewhere classified: Secondary | ICD-10-CM | POA: Diagnosis not present

## 2020-04-12 DIAGNOSIS — R278 Other lack of coordination: Secondary | ICD-10-CM | POA: Diagnosis not present

## 2020-04-12 DIAGNOSIS — R262 Difficulty in walking, not elsewhere classified: Secondary | ICD-10-CM | POA: Diagnosis not present

## 2020-04-14 DIAGNOSIS — E785 Hyperlipidemia, unspecified: Secondary | ICD-10-CM | POA: Diagnosis not present

## 2020-04-14 DIAGNOSIS — R262 Difficulty in walking, not elsewhere classified: Secondary | ICD-10-CM | POA: Diagnosis not present

## 2020-04-14 DIAGNOSIS — R278 Other lack of coordination: Secondary | ICD-10-CM | POA: Diagnosis not present

## 2020-04-14 DIAGNOSIS — E1169 Type 2 diabetes mellitus with other specified complication: Secondary | ICD-10-CM | POA: Diagnosis not present

## 2020-04-14 DIAGNOSIS — I110 Hypertensive heart disease with heart failure: Secondary | ICD-10-CM | POA: Diagnosis not present

## 2020-04-14 DIAGNOSIS — E569 Vitamin deficiency, unspecified: Secondary | ICD-10-CM | POA: Diagnosis not present

## 2020-04-16 DIAGNOSIS — N185 Chronic kidney disease, stage 5: Secondary | ICD-10-CM | POA: Diagnosis not present

## 2020-04-16 DIAGNOSIS — R262 Difficulty in walking, not elsewhere classified: Secondary | ICD-10-CM | POA: Diagnosis not present

## 2020-04-16 DIAGNOSIS — E1122 Type 2 diabetes mellitus with diabetic chronic kidney disease: Secondary | ICD-10-CM | POA: Diagnosis not present

## 2020-04-16 DIAGNOSIS — R278 Other lack of coordination: Secondary | ICD-10-CM | POA: Diagnosis not present

## 2020-04-16 DIAGNOSIS — I110 Hypertensive heart disease with heart failure: Secondary | ICD-10-CM | POA: Diagnosis not present

## 2020-04-17 DIAGNOSIS — R278 Other lack of coordination: Secondary | ICD-10-CM | POA: Diagnosis not present

## 2020-04-17 DIAGNOSIS — R262 Difficulty in walking, not elsewhere classified: Secondary | ICD-10-CM | POA: Diagnosis not present

## 2020-04-20 DIAGNOSIS — R278 Other lack of coordination: Secondary | ICD-10-CM | POA: Diagnosis not present

## 2020-04-20 DIAGNOSIS — R262 Difficulty in walking, not elsewhere classified: Secondary | ICD-10-CM | POA: Diagnosis not present

## 2020-04-22 DIAGNOSIS — R262 Difficulty in walking, not elsewhere classified: Secondary | ICD-10-CM | POA: Diagnosis not present

## 2020-04-22 DIAGNOSIS — N184 Chronic kidney disease, stage 4 (severe): Secondary | ICD-10-CM | POA: Diagnosis not present

## 2020-04-22 DIAGNOSIS — I129 Hypertensive chronic kidney disease with stage 1 through stage 4 chronic kidney disease, or unspecified chronic kidney disease: Secondary | ICD-10-CM | POA: Diagnosis not present

## 2020-04-22 DIAGNOSIS — D649 Anemia, unspecified: Secondary | ICD-10-CM | POA: Diagnosis not present

## 2020-04-22 DIAGNOSIS — R278 Other lack of coordination: Secondary | ICD-10-CM | POA: Diagnosis not present

## 2020-04-22 DIAGNOSIS — R809 Proteinuria, unspecified: Secondary | ICD-10-CM | POA: Diagnosis not present

## 2020-04-22 DIAGNOSIS — E1122 Type 2 diabetes mellitus with diabetic chronic kidney disease: Secondary | ICD-10-CM | POA: Diagnosis not present

## 2020-04-23 DIAGNOSIS — R262 Difficulty in walking, not elsewhere classified: Secondary | ICD-10-CM | POA: Diagnosis not present

## 2020-04-23 DIAGNOSIS — R278 Other lack of coordination: Secondary | ICD-10-CM | POA: Diagnosis not present

## 2020-04-23 DIAGNOSIS — Z1152 Encounter for screening for COVID-19: Secondary | ICD-10-CM | POA: Diagnosis not present

## 2020-04-24 DIAGNOSIS — N390 Urinary tract infection, site not specified: Secondary | ICD-10-CM | POA: Diagnosis not present

## 2020-04-25 DIAGNOSIS — R278 Other lack of coordination: Secondary | ICD-10-CM | POA: Diagnosis not present

## 2020-04-25 DIAGNOSIS — R262 Difficulty in walking, not elsewhere classified: Secondary | ICD-10-CM | POA: Diagnosis not present

## 2020-04-28 DIAGNOSIS — R262 Difficulty in walking, not elsewhere classified: Secondary | ICD-10-CM | POA: Diagnosis not present

## 2020-04-28 DIAGNOSIS — Z03818 Encounter for observation for suspected exposure to other biological agents ruled out: Secondary | ICD-10-CM | POA: Diagnosis not present

## 2020-04-28 DIAGNOSIS — R278 Other lack of coordination: Secondary | ICD-10-CM | POA: Diagnosis not present

## 2020-04-29 DIAGNOSIS — R262 Difficulty in walking, not elsewhere classified: Secondary | ICD-10-CM | POA: Diagnosis not present

## 2020-04-29 DIAGNOSIS — R278 Other lack of coordination: Secondary | ICD-10-CM | POA: Diagnosis not present

## 2020-04-30 DIAGNOSIS — R262 Difficulty in walking, not elsewhere classified: Secondary | ICD-10-CM | POA: Diagnosis not present

## 2020-04-30 DIAGNOSIS — R278 Other lack of coordination: Secondary | ICD-10-CM | POA: Diagnosis not present

## 2020-05-01 DIAGNOSIS — R278 Other lack of coordination: Secondary | ICD-10-CM | POA: Diagnosis not present

## 2020-05-01 DIAGNOSIS — R262 Difficulty in walking, not elsewhere classified: Secondary | ICD-10-CM | POA: Diagnosis not present

## 2020-05-04 DIAGNOSIS — R278 Other lack of coordination: Secondary | ICD-10-CM | POA: Diagnosis not present

## 2020-05-04 DIAGNOSIS — R262 Difficulty in walking, not elsewhere classified: Secondary | ICD-10-CM | POA: Diagnosis not present

## 2020-05-05 DIAGNOSIS — R262 Difficulty in walking, not elsewhere classified: Secondary | ICD-10-CM | POA: Diagnosis not present

## 2020-05-05 DIAGNOSIS — R278 Other lack of coordination: Secondary | ICD-10-CM | POA: Diagnosis not present

## 2020-05-05 DIAGNOSIS — Z03818 Encounter for observation for suspected exposure to other biological agents ruled out: Secondary | ICD-10-CM | POA: Diagnosis not present

## 2020-05-06 DIAGNOSIS — R278 Other lack of coordination: Secondary | ICD-10-CM | POA: Diagnosis not present

## 2020-05-06 DIAGNOSIS — R262 Difficulty in walking, not elsewhere classified: Secondary | ICD-10-CM | POA: Diagnosis not present

## 2020-05-07 DIAGNOSIS — R278 Other lack of coordination: Secondary | ICD-10-CM | POA: Diagnosis not present

## 2020-05-07 DIAGNOSIS — R262 Difficulty in walking, not elsewhere classified: Secondary | ICD-10-CM | POA: Diagnosis not present

## 2020-05-07 DIAGNOSIS — E1169 Type 2 diabetes mellitus with other specified complication: Secondary | ICD-10-CM | POA: Diagnosis not present

## 2020-05-07 DIAGNOSIS — D631 Anemia in chronic kidney disease: Secondary | ICD-10-CM | POA: Diagnosis not present

## 2020-05-07 DIAGNOSIS — N184 Chronic kidney disease, stage 4 (severe): Secondary | ICD-10-CM | POA: Diagnosis not present

## 2020-05-07 DIAGNOSIS — E559 Vitamin D deficiency, unspecified: Secondary | ICD-10-CM | POA: Diagnosis not present

## 2020-05-07 DIAGNOSIS — D649 Anemia, unspecified: Secondary | ICD-10-CM | POA: Diagnosis not present

## 2020-05-08 DIAGNOSIS — R278 Other lack of coordination: Secondary | ICD-10-CM | POA: Diagnosis not present

## 2020-05-08 DIAGNOSIS — R262 Difficulty in walking, not elsewhere classified: Secondary | ICD-10-CM | POA: Diagnosis not present

## 2020-05-09 DIAGNOSIS — R278 Other lack of coordination: Secondary | ICD-10-CM | POA: Diagnosis not present

## 2020-05-09 DIAGNOSIS — R262 Difficulty in walking, not elsewhere classified: Secondary | ICD-10-CM | POA: Diagnosis not present

## 2020-05-12 DIAGNOSIS — R278 Other lack of coordination: Secondary | ICD-10-CM | POA: Diagnosis not present

## 2020-05-12 DIAGNOSIS — Z03818 Encounter for observation for suspected exposure to other biological agents ruled out: Secondary | ICD-10-CM | POA: Diagnosis not present

## 2020-05-12 DIAGNOSIS — R262 Difficulty in walking, not elsewhere classified: Secondary | ICD-10-CM | POA: Diagnosis not present

## 2020-05-13 DIAGNOSIS — R262 Difficulty in walking, not elsewhere classified: Secondary | ICD-10-CM | POA: Diagnosis not present

## 2020-05-13 DIAGNOSIS — R278 Other lack of coordination: Secondary | ICD-10-CM | POA: Diagnosis not present

## 2020-05-14 DIAGNOSIS — I110 Hypertensive heart disease with heart failure: Secondary | ICD-10-CM | POA: Diagnosis not present

## 2020-05-14 DIAGNOSIS — E559 Vitamin D deficiency, unspecified: Secondary | ICD-10-CM | POA: Diagnosis not present

## 2020-05-14 DIAGNOSIS — N185 Chronic kidney disease, stage 5: Secondary | ICD-10-CM | POA: Diagnosis not present

## 2020-05-14 DIAGNOSIS — I5032 Chronic diastolic (congestive) heart failure: Secondary | ICD-10-CM | POA: Diagnosis not present

## 2020-05-14 DIAGNOSIS — R278 Other lack of coordination: Secondary | ICD-10-CM | POA: Diagnosis not present

## 2020-05-14 DIAGNOSIS — E1122 Type 2 diabetes mellitus with diabetic chronic kidney disease: Secondary | ICD-10-CM | POA: Diagnosis not present

## 2020-05-14 DIAGNOSIS — R262 Difficulty in walking, not elsewhere classified: Secondary | ICD-10-CM | POA: Diagnosis not present

## 2020-05-15 DIAGNOSIS — R262 Difficulty in walking, not elsewhere classified: Secondary | ICD-10-CM | POA: Diagnosis not present

## 2020-05-15 DIAGNOSIS — R278 Other lack of coordination: Secondary | ICD-10-CM | POA: Diagnosis not present

## 2020-05-16 DIAGNOSIS — R262 Difficulty in walking, not elsewhere classified: Secondary | ICD-10-CM | POA: Diagnosis not present

## 2020-05-16 DIAGNOSIS — R278 Other lack of coordination: Secondary | ICD-10-CM | POA: Diagnosis not present

## 2020-05-19 DIAGNOSIS — Z03818 Encounter for observation for suspected exposure to other biological agents ruled out: Secondary | ICD-10-CM | POA: Diagnosis not present

## 2020-05-19 DIAGNOSIS — R278 Other lack of coordination: Secondary | ICD-10-CM | POA: Diagnosis not present

## 2020-05-19 DIAGNOSIS — R262 Difficulty in walking, not elsewhere classified: Secondary | ICD-10-CM | POA: Diagnosis not present

## 2020-05-20 DIAGNOSIS — N185 Chronic kidney disease, stage 5: Secondary | ICD-10-CM | POA: Diagnosis not present

## 2020-05-20 DIAGNOSIS — R278 Other lack of coordination: Secondary | ICD-10-CM | POA: Diagnosis not present

## 2020-05-20 DIAGNOSIS — E1122 Type 2 diabetes mellitus with diabetic chronic kidney disease: Secondary | ICD-10-CM | POA: Diagnosis not present

## 2020-05-20 DIAGNOSIS — R809 Proteinuria, unspecified: Secondary | ICD-10-CM | POA: Diagnosis not present

## 2020-05-20 DIAGNOSIS — R262 Difficulty in walking, not elsewhere classified: Secondary | ICD-10-CM | POA: Diagnosis not present

## 2020-05-20 DIAGNOSIS — D649 Anemia, unspecified: Secondary | ICD-10-CM | POA: Diagnosis not present

## 2020-05-20 DIAGNOSIS — I120 Hypertensive chronic kidney disease with stage 5 chronic kidney disease or end stage renal disease: Secondary | ICD-10-CM | POA: Diagnosis not present

## 2020-05-21 DIAGNOSIS — R278 Other lack of coordination: Secondary | ICD-10-CM | POA: Diagnosis not present

## 2020-05-21 DIAGNOSIS — R262 Difficulty in walking, not elsewhere classified: Secondary | ICD-10-CM | POA: Diagnosis not present

## 2020-05-22 DIAGNOSIS — N184 Chronic kidney disease, stage 4 (severe): Secondary | ICD-10-CM | POA: Diagnosis not present

## 2020-05-22 DIAGNOSIS — R278 Other lack of coordination: Secondary | ICD-10-CM | POA: Diagnosis not present

## 2020-05-22 DIAGNOSIS — R262 Difficulty in walking, not elsewhere classified: Secondary | ICD-10-CM | POA: Diagnosis not present

## 2020-05-22 DIAGNOSIS — D631 Anemia in chronic kidney disease: Secondary | ICD-10-CM | POA: Diagnosis not present

## 2020-05-23 DIAGNOSIS — R262 Difficulty in walking, not elsewhere classified: Secondary | ICD-10-CM | POA: Diagnosis not present

## 2020-05-23 DIAGNOSIS — R278 Other lack of coordination: Secondary | ICD-10-CM | POA: Diagnosis not present

## 2020-05-25 DIAGNOSIS — R278 Other lack of coordination: Secondary | ICD-10-CM | POA: Diagnosis not present

## 2020-05-25 DIAGNOSIS — R262 Difficulty in walking, not elsewhere classified: Secondary | ICD-10-CM | POA: Diagnosis not present

## 2020-05-26 DIAGNOSIS — I110 Hypertensive heart disease with heart failure: Secondary | ICD-10-CM | POA: Diagnosis not present

## 2020-05-26 DIAGNOSIS — E1169 Type 2 diabetes mellitus with other specified complication: Secondary | ICD-10-CM | POA: Diagnosis not present

## 2020-05-26 DIAGNOSIS — Z03818 Encounter for observation for suspected exposure to other biological agents ruled out: Secondary | ICD-10-CM | POA: Diagnosis not present

## 2020-05-26 DIAGNOSIS — I5032 Chronic diastolic (congestive) heart failure: Secondary | ICD-10-CM | POA: Diagnosis not present

## 2020-05-26 DIAGNOSIS — R531 Weakness: Secondary | ICD-10-CM | POA: Diagnosis not present

## 2020-05-26 DIAGNOSIS — E785 Hyperlipidemia, unspecified: Secondary | ICD-10-CM | POA: Diagnosis not present

## 2020-05-27 ENCOUNTER — Other Ambulatory Visit: Payer: Self-pay | Admitting: *Deleted

## 2020-05-27 DIAGNOSIS — N189 Chronic kidney disease, unspecified: Secondary | ICD-10-CM

## 2020-06-05 ENCOUNTER — Encounter: Payer: Self-pay | Admitting: Physician Assistant

## 2020-06-05 ENCOUNTER — Other Ambulatory Visit: Payer: Self-pay

## 2020-06-05 ENCOUNTER — Ambulatory Visit (INDEPENDENT_AMBULATORY_CARE_PROVIDER_SITE_OTHER): Payer: Medicare Other | Admitting: Physician Assistant

## 2020-06-05 ENCOUNTER — Ambulatory Visit (HOSPITAL_COMMUNITY)
Admission: RE | Admit: 2020-06-05 | Discharge: 2020-06-05 | Disposition: A | Discharge status: Home / Self Care | Payer: Medicare Other | Source: Ambulatory Visit | Attending: Vascular Surgery | Admitting: Vascular Surgery

## 2020-06-05 VITALS — BP 130/68 | HR 81 | Temp 98.0°F | Resp 20 | Ht 69.0 in | Wt 200.0 lb

## 2020-06-05 DIAGNOSIS — N189 Chronic kidney disease, unspecified: Secondary | ICD-10-CM | POA: Diagnosis present

## 2020-06-05 NOTE — Progress Notes (Signed)
Established Dialysis Access   History of Present Illness   Darrell [name] is a 79 y.o. (06-May-1941) male who presents for re-evaluation of permanent access.  He is status post left brachiocephalic fistula creation by Dr. Scot Dock in August 2020.  He is referred back to our office for evaluation of fistula and to see if it is ready for use during hemodialysis.  Based on referral notes nephrology believes he will require initiation of hemodialysis soon.  Patient is currently staying at [facility].  He does not know who his nephrologist is.  He does not know at which kidney center he will dialyze.  The patient's PMH, PSH, SH, and FamHx were reviewed and are unchanged from prior.  Current Outpatient Medications  Medication Sig Dispense Refill   acetaminophen (TYLENOL) 325 MG tablet Take 2 tablets (650 mg total) by mouth every 6 (six) hours as needed for mild pain (or Fever >/= 101).     amLODipine (NORVASC) 10 MG tablet Take 10 mg by mouth daily.     atorvastatin (LIPITOR) 20 MG tablet TAKE ONE TABLET BY MOUTH EVERY DAY (Patient taking differently: Take 20 mg by mouth daily. ) 30 tablet 5   buPROPion (WELLBUTRIN XL) 150 MG 24 hr tablet Take 150 mg by mouth daily.     Calcium Carbonate (CALCIUM 600 PO) Take 600 mg by mouth daily.     DULoxetine (CYMBALTA) 60 MG capsule Take 60 mg by mouth daily.     dutasteride (AVODART) 0.5 MG capsule Take 0.5 mg by mouth daily.     emtricitabine-rilpivir-tenofovir AF (ODEFSEY) 200-25-25 MG TABS tablet Take 1 tablet by mouth daily.     feeding supplement, ENSURE ENLIVE, (ENSURE ENLIVE) LIQD Take 237 mLs by mouth 2 (two) times daily between meals. 237 mL 12   ferrous sulfate 325 (65 FE) MG EC tablet Take 1 tablet (325 mg total) by mouth 2 (two) times daily. 60 tablet 3   fesoterodine (TOVIAZ) 8 MG TB24 tablet Take 8 mg by mouth daily.     FIBER PO Take 1 tablet by mouth daily.     insulin aspart (NOVOLOG) 100 UNIT/ML injection Inject  0-15 Units into the skin 3 (three) times daily with meals. 10 mL 11   insulin NPH-regular Human (70-30) 100 UNIT/ML injection Inject 5 Units into the skin 2 (two) times daily with a meal. 10 mL 11   ketotifen (ZADITOR) 0.025 % ophthalmic solution Place 1 drop into both eyes 2 (two) times daily as needed (irritated eyes).     Melatonin 5 MG TABS Take 10 mg by mouth at bedtime.      mirabegron ER (MYRBETRIQ) 25 MG TB24 tablet Take 25 mg by mouth daily.     Multiple Vitamin (MULTIVITAMIN WITH MINERALS) TABS tablet Take 1 tablet by mouth daily.     Omega-3 Fatty Acids (FISH OIL) 1000 MG CAPS Take 1,000 mg by mouth 2 (two) times a day.     opium-belladonna (B&O SUPPRETTES) 16.2-60 MG suppository Place 1 suppository rectally every 8 (eight) hours as needed for bladder spasms. 3 suppository 0   risperiDONE (RISPERDAL) 1 MG tablet Take 1 mg by mouth at bedtime.     Tamsulosin HCl (FLOMAX) 0.4 MG CAPS TAKE ONE CAPSULE BY MOUTH EVERY DAY (Patient taking differently: Take 0.4 mg by mouth daily. ) 30 capsule 6   traMADol (ULTRAM) 50 MG tablet Take 1 tablet (50 mg total) by mouth every 6 (six) hours as needed. 6 tablet 0  trazodone (DESYREL) 300 MG tablet Take 300 mg by mouth at bedtime.     zolpidem (AMBIEN) 10 MG tablet Take 1 tablet (10 mg total) by mouth at bedtime as needed for sleep. 5 tablet 0   No current facility-administered medications for this visit.    REVIEW OF SYSTEMS (negative unless checked):   Cardiac:  []  Chest pain or chest pressure? []  Shortness of breath upon activity? []  Shortness of breath when lying flat? []  Irregular heart rhythm?  Vascular:  []  Pain in calf, thigh, or hip brought on by walking? []  Pain in feet at night that wakes you up from your sleep? []  Blood clot in your veins? []  Leg swelling?  Pulmonary:  []  Oxygen at home? []  Productive cough? []  Wheezing?  Neurologic:  []  Sudden weakness in arms or legs? []  Sudden numbness in arms or legs? []   Sudden onset of difficult speaking or slurred speech? []  Temporary loss of vision in one eye? []  Problems with dizziness?  Gastrointestinal:  []  Blood in stool? []  Vomited blood?  Genitourinary:  []  Burning when urinating? []  Blood in urine?  Psychiatric:  []  Major depression  Hematologic:  []  Bleeding problems? []  Problems with blood clotting?  Dermatologic:  []  Rashes or ulcers?  Constitutional:  []  Fever or chills?  Ear/Nose/Throat:  []  Change in hearing? []  Nose bleeds? []  Sore throat?  Musculoskeletal:  []  Back pain? []  Joint pain? []  Muscle pain?   Physical Examination   Vitals:   06/05/20 0958  BP: 130/68  Pulse: 81  Resp: 20  Temp: 98 F (36.7 C)  TempSrc: Temporal  SpO2: 96%  Weight: 200 lb (90.7 kg)  Height: 5\' 9"  (1.753 m)   Body mass index is 29.53 kg/m.  General:  WDWN in NAD; vital signs documented above Gait: Not observed HENT: WNL, normocephalic Pulmonary: normal non-labored breathing Cardiac: regular HR Abdomen: soft, NT, no masses Skin: without rashes Vascular Exam/Pulses:  Right Left  Radial 2+ (normal) 2+ (normal)   Extremities: palpable thrill throughout fistula in upper arm Musculoskeletal: no muscle wasting or atrophy  Neurologic: A&O X 3;  No focal weakness or paresthesias are detected Psychiatric:  The pt has Normal affect.   Non-invasive Vascular Imaging   left Arm Access Duplex :   Diameters:  7 mm  Depth:  3 mm      Medical Decision Making   Darrell [name] is a 79 y.o. male who presents with ESRD requiring hemodialysis.    Patent L brachiocephalic fistula without signs or symptoms of steal syndrome  Fistula is ready for use whenever HD is initiated  He may follow up on an as needed basis   Dagoberto Ligas PA-C [facility] Office: [facility] MD: Scot Dock

## 2020-06-10 IMAGING — US ULTRASOUND CORE BIOPSY
1 series · 10 of 10 positions shown · non-contrast
Comparison: none

INDICATION: Chronic kidney disease, proteinuria, stage IV

[Series 1: ultrasound core biopsy · 10 of 10 slices shown]
[im 1/10]
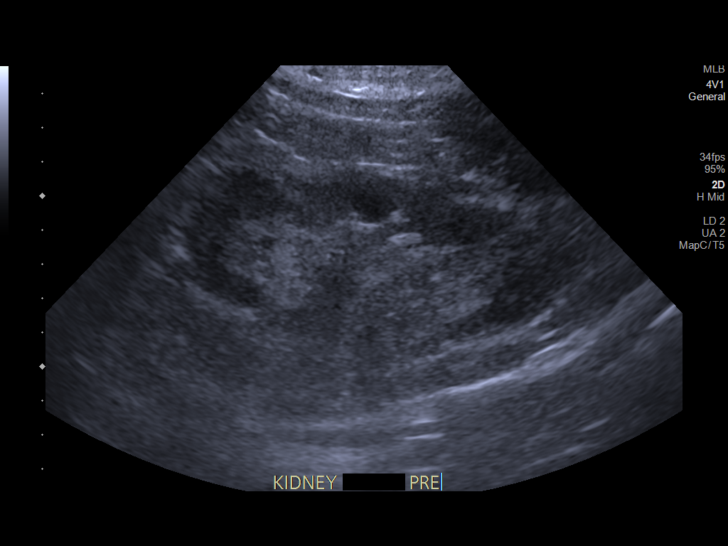
[im 2/10]
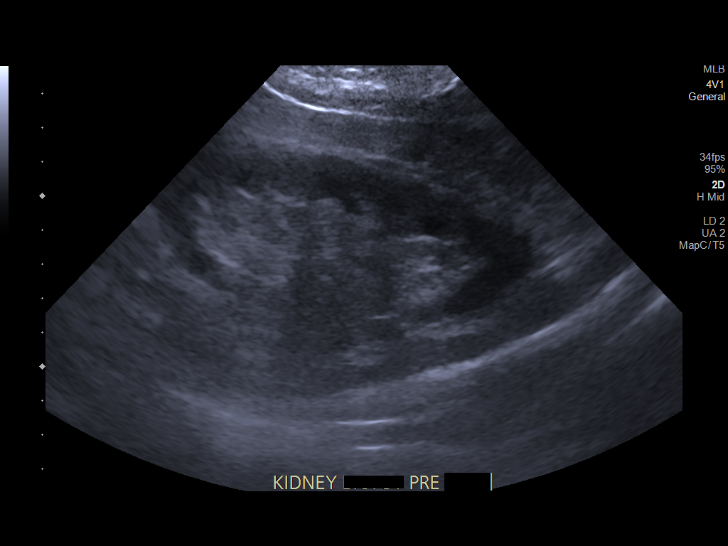
[im 3/10]
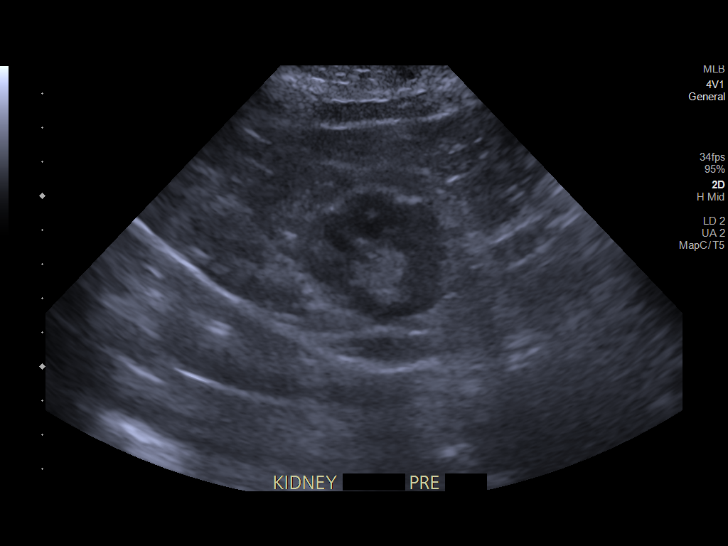
[im 4/10]
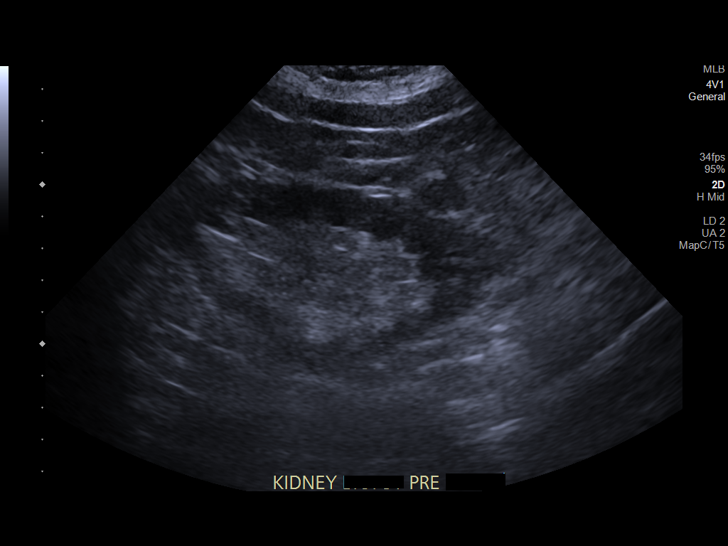
[im 5/10]
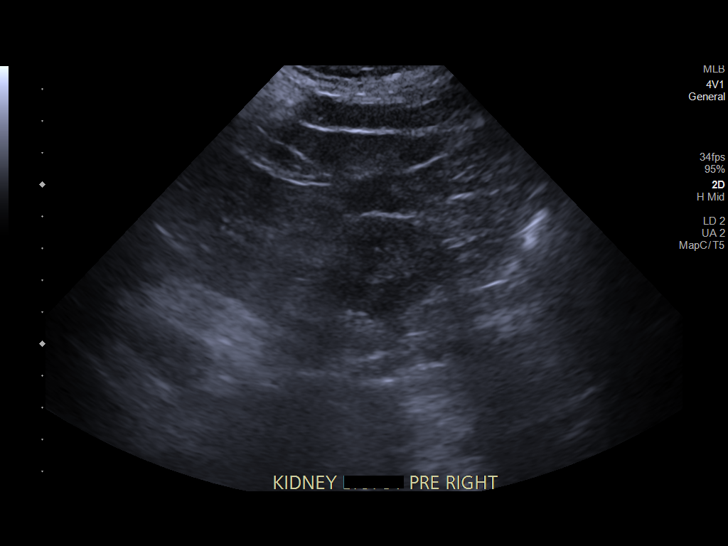
[im 6/10]
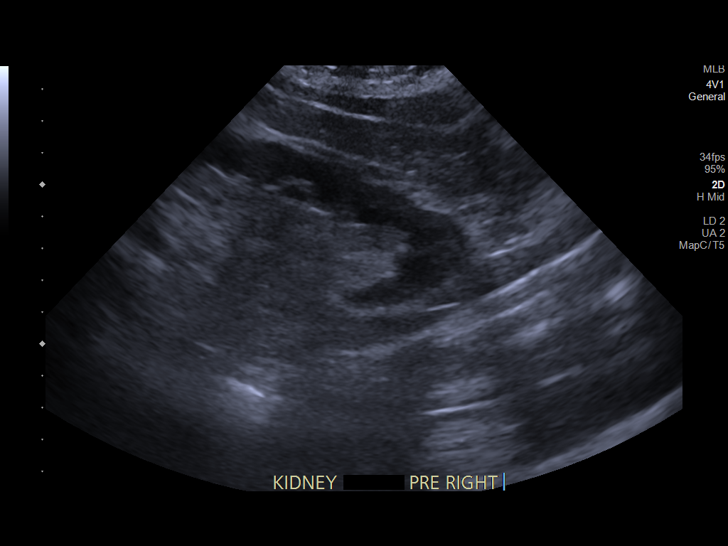
[im 7/10]
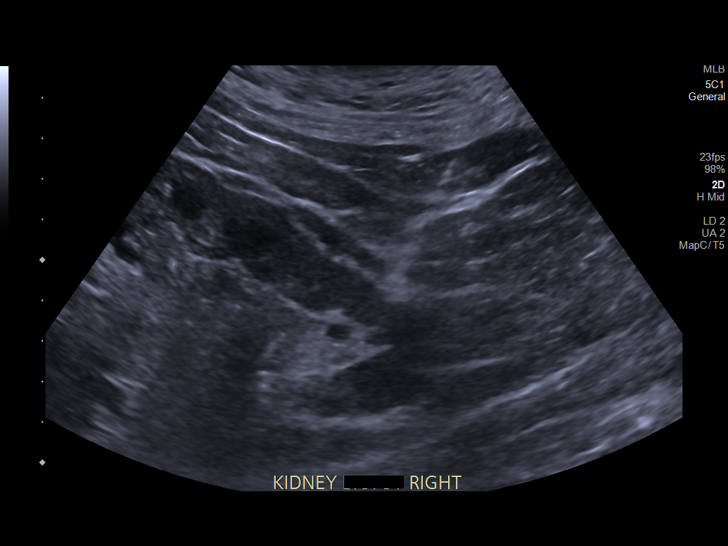
[im 8/10]
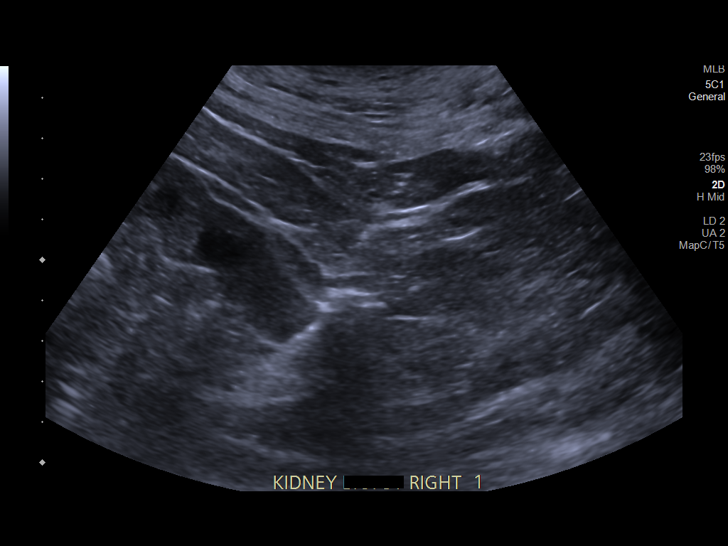
[im 9/10]
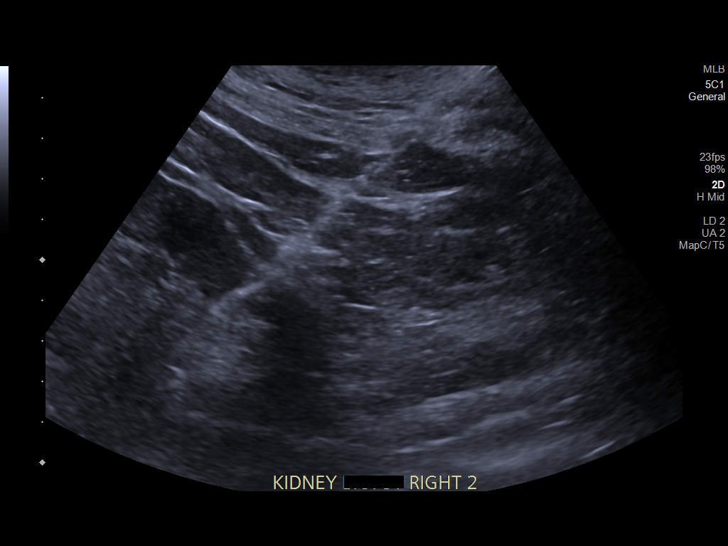
[im 10/10]
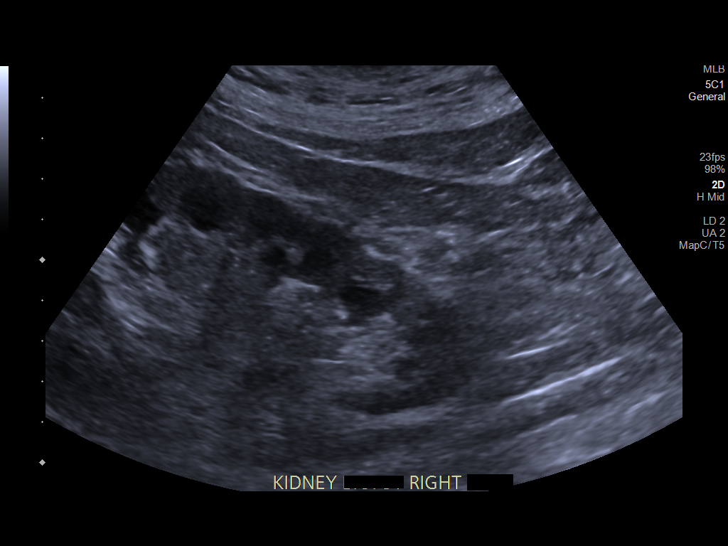

[10 of 10 positions shown; findings below may reference images not displayed]

EXAM:
ULTRASOUND LEFT RENAL 16 GAUGE CORE BIOPSY

MEDICATIONS:
1% LIDOCAINE LOCAL

ANESTHESIA/SEDATION:
Moderate (conscious) sedation was employed during this procedure. A
total of Versed 1.0 mg and Fentanyl 50 mcg was administered
intravenously.

Moderate Sedation Time: 10 minutes. The patient's level of
consciousness and vital signs were monitored continuously by
radiology nursing throughout the procedure under my direct
supervision.

FLUOROSCOPY TIME:  Fluoroscopy Time: None.

COMPLICATIONS:
None immediate.

PROCEDURE:
Informed written consent was obtained from the patient after a
thorough discussion of the procedural risks, benefits and
alternatives. All questions were addressed. Maximal Sterile Barrier
Technique was utilized including caps, mask, sterile gowns, sterile
gloves, sterile drape, hand hygiene and skin antiseptic. A timeout
was performed prior to the initiation of the procedure.

Previous imaging reviewed. Patient position prone. Left kidney lower
pole was localized with ultrasound and marked. Images obtained for
documentation. Under sterile conditions and local anesthesia, a 15
gauge coaxial guide needle was advanced to the left kidney lower
pole cortex. Needle position confirmed with ultrasound. Through the
access, 2 16 gauge core biopsies obtained of the left kidney cortex.
Samples were intact and non fragmented. These were placed in saline.
Postprocedure imaging demonstrates no hemorrhage or hematoma.
Patient tolerated biopsy well.
IMPRESSION: Successful ultrasound left renal 16 gauge core biopsy

## 2021-06-18 ENCOUNTER — Ambulatory Visit (INDEPENDENT_AMBULATORY_CARE_PROVIDER_SITE_OTHER): Payer: Medicare Other | Admitting: Internal Medicine

## 2021-06-18 ENCOUNTER — Other Ambulatory Visit: Payer: Self-pay

## 2021-06-18 ENCOUNTER — Other Ambulatory Visit (HOSPITAL_COMMUNITY): Payer: Self-pay

## 2021-06-18 ENCOUNTER — Encounter: Payer: Self-pay | Admitting: Internal Medicine

## 2021-06-18 ENCOUNTER — Telehealth: Payer: Self-pay

## 2021-06-18 VITALS — BP 108/63 | HR 102 | Temp 98.3°F

## 2021-06-18 DIAGNOSIS — Z21 Asymptomatic human immunodeficiency virus [HIV] infection status: Secondary | ICD-10-CM

## 2021-06-18 DIAGNOSIS — Z113 Encounter for screening for infections with a predominantly sexual mode of transmission: Secondary | ICD-10-CM | POA: Insufficient documentation

## 2021-06-18 MED ORDER — BICTEGRAVIR-EMTRICITAB-TENOFOV 50-200-25 MG PO TABS: 1.0000 | ORAL_TABLET | Freq: Every day | ORAL | 11 refills | Status: AC

## 2021-06-18 NOTE — Telephone Encounter (Signed)
[facility] Patient Teacher, English as a foreign language completed.    The patient is insured through [employer] and has a 0.00 copay.  Darrell [name] is covered through pharmacy benefits and can be filled at [facility].  We will continue to follow to see if copay assistance is needed.  Darrell [name], [facility] Patient [facility] Phone: [phone] Fax:  [phone]

## 2021-06-18 NOTE — Progress Notes (Signed)
Patient ID: Darrell [name], male    DOB: [birth year], 80 y.o.   MRN: [id number]  Reason for visit: to establish care as a new patient with established HIV  HPI:   Patient was first diagnosed many years ago, which he does not recall and previously on [facility] and had been doing well.  He was last seen in August of 2020 at [facility] and no concerns at that time.  He was due for a pneumococcal 23 vaccine but otherwise had a Prevnar by report.  His CD4 then was 228 and viral load <20.  He is now on dialysis.  I have seen him previously multiple times but he does not remember me.  He is now wheelchair bound and asking about why he is weak.  He is living in a rehab facility.  He reports depression.    Past Medical History:  Diagnosis Date   Anemia    Anxiety    Arthritis    Cancer ([place])    prostate cancer   Cataract    Chronic kidney disease    Stage 4   Depression    Diabetes mellitus without complication ([place])    HIV (human immunodeficiency virus infection) ([place])    HIV infection ([place])    Hyperlipidemia    Hypertension    Pneumonia 2018   Schizophrenia ([place])    Substance abuse ([place])     Prior to Admission medications   Medication Sig Start Date End Date Taking? Authorizing Provider  acetaminophen (TYLENOL) 325 MG tablet Take 2 tablets (650 mg total) by mouth every 6 (six) hours as needed for mild pain (or Fever >/= 101). 10/25/19  Yes Danford, Suann Larry, MD  amLODipine (NORVASC) 10 MG tablet Take 10 mg by mouth daily.   Yes [provider]  atorvastatin (LIPITOR) 20 MG tablet TAKE ONE TABLET BY MOUTH EVERY DAY Patient taking differently: Take 20 mg by mouth daily. 10/02/12  Yes Tommy Medal, Lavell Islam, MD  bictegravir-emtricitabine-tenofovir AF (BIKTARVY) 50-200-25 MG TABS tablet Take 1 tablet by mouth daily. 06/18/21  Yes [place] Youngblood, Okey Regal, MD  buPROPion (WELLBUTRIN XL) 150 MG 24 hr tablet Take 150 mg by mouth daily. 06/26/19  Yes [provider]  Calcium Carbonate  (CALCIUM 600 PO) Take 600 mg by mouth daily.   Yes [provider]  DULoxetine (CYMBALTA) 60 MG capsule Take 60 mg by mouth daily.   Yes [provider]  dutasteride (AVODART) 0.5 MG capsule Take 0.5 mg by mouth daily.   Yes [provider]  feeding supplement, ENSURE ENLIVE, (ENSURE ENLIVE) LIQD Take 237 mLs by mouth 2 (two) times daily between meals. 10/25/19  Yes Danford, Suann Larry, MD  fesoterodine (TOVIAZ) 8 MG TB24 tablet Take 8 mg by mouth daily.   Yes [provider]  FIBER PO Take 1 tablet by mouth daily.   Yes [provider]  insulin aspart (NOVOLOG) 100 UNIT/ML injection Inject 0-15 Units into the skin 3 (three) times daily with meals. 10/25/19  Yes Danford, Suann Larry, MD  insulin NPH-regular Human (70-30) 100 UNIT/ML injection Inject 5 Units into the skin 2 (two) times daily with a meal. 10/25/19  Yes Danford, Suann Larry, MD  ketotifen (ZADITOR) 0.025 % ophthalmic solution Place 1 drop into both eyes 2 (two) times daily as needed (irritated eyes).   Yes [provider]  Melatonin 5 MG TABS Take 10 mg by mouth at bedtime.    Yes [provider]  mirabegron ER (MYRBETRIQ) 25  MG TB24 tablet Take 25 mg by mouth daily.   Yes [provider]  Multiple Vitamin (MULTIVITAMIN WITH MINERALS) TABS tablet Take 1 tablet by mouth daily.   Yes [provider]  Omega-3 Fatty Acids (FISH OIL) 1000 MG CAPS Take 1,000 mg by mouth 2 (two) times a day.   Yes [provider]  opium-belladonna (B&O SUPPRETTES) 16.2-60 MG suppository Place 1 suppository rectally every 8 (eight) hours as needed for bladder spasms. 10/25/19  Yes Danford, Suann Larry, MD  risperiDONE (RISPERDAL) 1 MG tablet Take 1 mg by mouth at bedtime.   Yes [provider]  Tamsulosin HCl (FLOMAX) 0.4 MG CAPS TAKE ONE CAPSULE BY MOUTH EVERY DAY Patient taking differently: Take 0.4 mg by mouth daily. 10/20/11  Yes Tommy Medal, Lavell Islam, MD   traMADol (ULTRAM) 50 MG tablet Take 1 tablet (50 mg total) by mouth every 6 (six) hours as needed. 10/25/19  Yes Danford, Suann Larry, MD  trazodone (DESYREL) 300 MG tablet Take 300 mg by mouth at bedtime.   Yes [provider]  zolpidem (AMBIEN) 10 MG tablet Take 1 tablet (10 mg total) by mouth at bedtime as needed for sleep. 10/25/19  Yes Danford, Suann Larry, MD  ferrous sulfate 325 (65 FE) MG EC tablet Take 1 tablet (325 mg total) by mouth 2 (two) times daily. 10/25/19 10/24/20  Edwin Dada, MD    Allergies  Allergen Reactions   Aspirin     Other reaction(s): Other (See Comments), Unknown Unknown reaction documented on MAR    Oxycodone    Percodan [Oxycodone-Aspirin] Other (See Comments)    Prickly feeling in hands, arms and torso    Social History   Tobacco Use   Smoking status: Never   Smokeless tobacco: Never  Vaping Use   Vaping Use: Never used  Substance Use Topics   Alcohol use: Not Currently    Comment: heavy drinker in the past (no alchol for ? 10-15 years )   Drug use: Never    Family History  Family history unknown: Yes    Review of Systems Constitutional: negative for fevers and chills Gastrointestinal: negative for nausea and diarrhea Integument/breast: negative for rash All other systems reviewed and are negative    CONSTITUTIONAL:in no apparent distress  Vitals:   06/18/21 1413  BP: 108/63  Pulse: (!) 102  Temp: 98.3 F (36.8 C)   EYES: anicteric HENT: no thrush CARD:Cor RRR RESP:clear DV:VOHYW sounds are normal, liver is not enlarged, spleen is not enlarged MS:no pedal edema noted SKIN:no rashes  No results found for: HIV1RNAQUANT No components found for: HIV1GENOTYPRPLUS No components found for: THELPERCELL  Assessment: new patient here with esbaltished HIV.  Discussed with patient treatment options and side effects, benefits of treatment, long term outcomes.  I discussed the severity of untreated HIV including  higher cancer risk, opportunistic infections, renal failure.  Also discussed needing to use condoms, partner disclosure, necessary vaccines, blood monitoring.  All questions answered.  Not new.  I did discuss typical regimens for patients with ESRD and discussed changing him to [place].   Other labs, lipid panel, pneumovax per nephrology.   Plan: 1)  start [place] 2) stop [place] 3) labs today to confirm viral suppression  He can follow up in 1 year.

## 2021-06-19 LAB — T-HELPER CELL (CD4) - (RCID CLINIC ONLY)
CD4 % Helper T Cell: 23 % — ABNORMAL LOW (ref 33–65)
CD4 T Cell Abs: 285 /uL — ABNORMAL LOW (ref 400–1790)

## 2021-06-22 LAB — FLUORESCENT TREPONEMAL AB(FTA)-IGG-BLD: Fluorescent Treponemal ABS: REACTIVE — AB

## 2021-06-22 LAB — RPR TITER: RPR Titer: 1:2 {titer} — ABNORMAL HIGH

## 2021-06-22 LAB — HIV-1 RNA QUANT-NO REFLEX-BLD
HIV 1 RNA Quant: 47 Copies/mL — ABNORMAL HIGH
HIV-1 RNA Quant, Log: 1.67 Log cps/mL — ABNORMAL HIGH

## 2021-06-22 LAB — RPR: RPR Ser Ql: REACTIVE — AB

## 2021-06-29 ENCOUNTER — Encounter: Payer: Self-pay | Admitting: Infectious Diseases

## 2021-11-25 DEATH — deceased

## 2022-06-16 ENCOUNTER — Ambulatory Visit: Payer: Medicare Other | Admitting: Internal Medicine
